# Patient Record
Sex: Female | Born: 1960 | Race: Black or African American | Hispanic: No | Marital: Single | Smoking: Never smoker
Health system: Southern US, Community
[De-identification: ages and names within clinical notes are randomized; demographics above are authoritative.]

## PROBLEM LIST (undated history)

## (undated) DIAGNOSIS — M5410 Radiculopathy, site unspecified: Secondary | ICD-10-CM

## (undated) DIAGNOSIS — E039 Hypothyroidism, unspecified: Secondary | ICD-10-CM

## (undated) DIAGNOSIS — M064 Inflammatory polyarthropathy: Secondary | ICD-10-CM

## (undated) DIAGNOSIS — D259 Leiomyoma of uterus, unspecified: Secondary | ICD-10-CM

## (undated) DIAGNOSIS — R569 Unspecified convulsions: Secondary | ICD-10-CM

## (undated) DIAGNOSIS — IMO0002 Reserved for concepts with insufficient information to code with codable children: Secondary | ICD-10-CM

## (undated) DIAGNOSIS — J302 Other seasonal allergic rhinitis: Secondary | ICD-10-CM

## (undated) DIAGNOSIS — R51 Headache: Secondary | ICD-10-CM

## (undated) DIAGNOSIS — A0472 Enterocolitis due to Clostridium difficile, not specified as recurrent: Secondary | ICD-10-CM

## (undated) DIAGNOSIS — R413 Other amnesia: Secondary | ICD-10-CM

## (undated) DIAGNOSIS — M359 Systemic involvement of connective tissue, unspecified: Secondary | ICD-10-CM

## (undated) DIAGNOSIS — M1990 Unspecified osteoarthritis, unspecified site: Secondary | ICD-10-CM

## (undated) DIAGNOSIS — G5600 Carpal tunnel syndrome, unspecified upper limb: Secondary | ICD-10-CM

## (undated) DIAGNOSIS — Z8719 Personal history of other diseases of the digestive system: Secondary | ICD-10-CM

## (undated) DIAGNOSIS — Q120 Congenital cataract: Secondary | ICD-10-CM

## (undated) DIAGNOSIS — G43709 Chronic migraine without aura, not intractable, without status migrainosus: Secondary | ICD-10-CM

## (undated) DIAGNOSIS — N200 Calculus of kidney: Secondary | ICD-10-CM

## (undated) DIAGNOSIS — J329 Chronic sinusitis, unspecified: Secondary | ICD-10-CM

## (undated) DIAGNOSIS — E785 Hyperlipidemia, unspecified: Secondary | ICD-10-CM

## (undated) DIAGNOSIS — G4730 Sleep apnea, unspecified: Secondary | ICD-10-CM

## (undated) DIAGNOSIS — Z1589 Genetic susceptibility to other disease: Secondary | ICD-10-CM

## (undated) DIAGNOSIS — H35039 Hypertensive retinopathy, unspecified eye: Secondary | ICD-10-CM

## (undated) DIAGNOSIS — R7303 Prediabetes: Secondary | ICD-10-CM

## (undated) DIAGNOSIS — G932 Benign intracranial hypertension: Secondary | ICD-10-CM

## (undated) DIAGNOSIS — D649 Anemia, unspecified: Secondary | ICD-10-CM

## (undated) DIAGNOSIS — E559 Vitamin D deficiency, unspecified: Secondary | ICD-10-CM

## (undated) DIAGNOSIS — K5792 Diverticulitis of intestine, part unspecified, without perforation or abscess without bleeding: Secondary | ICD-10-CM

## (undated) DIAGNOSIS — R404 Transient alteration of awareness: Secondary | ICD-10-CM

## (undated) DIAGNOSIS — M47819 Spondylosis without myelopathy or radiculopathy, site unspecified: Secondary | ICD-10-CM

## (undated) HISTORY — DX: Genetic susceptibility to other disease: Z15.89

## (undated) HISTORY — DX: Enterocolitis due to Clostridium difficile, not specified as recurrent: A04.72

## (undated) HISTORY — DX: Vitamin D deficiency, unspecified: E55.9

## (undated) HISTORY — PX: TONSILLECTOMY: SUR1361

## (undated) HISTORY — DX: Inflammatory polyarthropathy: M06.4

## (undated) HISTORY — DX: Chronic sinusitis, unspecified: J32.9

## (undated) HISTORY — DX: Prediabetes: R73.03

## (undated) HISTORY — DX: Carpal tunnel syndrome, unspecified upper limb: G56.00

## (undated) HISTORY — DX: Reserved for concepts with insufficient information to code with codable children: IMO0002

## (undated) HISTORY — DX: Other amnesia: R41.3

## (undated) HISTORY — DX: Hypertensive retinopathy, unspecified eye: H35.039

## (undated) HISTORY — DX: Diverticulitis of intestine, part unspecified, without perforation or abscess without bleeding: K57.92

## (undated) HISTORY — DX: Transient alteration of awareness: R40.4

## (undated) HISTORY — DX: Radiculopathy, site unspecified: M54.10

## (undated) HISTORY — DX: Morbid (severe) obesity due to excess calories: E66.01

## (undated) HISTORY — DX: Unspecified osteoarthritis, unspecified site: M19.90

## (undated) HISTORY — DX: Leiomyoma of uterus, unspecified: D25.9

## (undated) HISTORY — DX: Chronic migraine without aura, not intractable, without status migrainosus: G43.709

## (undated) HISTORY — DX: Calculus of kidney: N20.0

## (undated) HISTORY — DX: Systemic involvement of connective tissue, unspecified: M35.9

## (undated) HISTORY — DX: Congenital cataract: Q12.0

## (undated) HISTORY — DX: Spondylosis without myelopathy or radiculopathy, site unspecified: M47.819

## (undated) HISTORY — PX: VESICOVAGINAL FISTULA CLOSURE W/ TAH: SUR271

---
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
## 1998-08-18 ENCOUNTER — Emergency Department (HOSPITAL_COMMUNITY): Admission: EM | Admit: 1998-08-18 | Discharge: 1998-08-18 | Payer: Self-pay | Admitting: Emergency Medicine

## 2000-04-15 ENCOUNTER — Encounter: Payer: Self-pay | Admitting: Internal Medicine

## 2000-04-15 ENCOUNTER — Emergency Department (HOSPITAL_COMMUNITY): Admission: EM | Admit: 2000-04-15 | Discharge: 2000-04-15 | Payer: Self-pay | Admitting: Internal Medicine

## 2000-09-06 ENCOUNTER — Emergency Department (HOSPITAL_COMMUNITY): Admission: EM | Admit: 2000-09-06 | Discharge: 2000-09-06 | Payer: Self-pay

## 2001-07-07 ENCOUNTER — Emergency Department (HOSPITAL_COMMUNITY): Admission: EM | Admit: 2001-07-07 | Discharge: 2001-07-08 | Payer: Self-pay | Admitting: Emergency Medicine

## 2002-03-24 ENCOUNTER — Encounter: Payer: Self-pay | Admitting: Emergency Medicine

## 2002-03-24 ENCOUNTER — Emergency Department (HOSPITAL_COMMUNITY): Admission: EM | Admit: 2002-03-24 | Discharge: 2002-03-24 | Payer: Self-pay | Admitting: Emergency Medicine

## 2004-12-01 ENCOUNTER — Emergency Department (HOSPITAL_COMMUNITY): Admission: EM | Admit: 2004-12-01 | Discharge: 2004-12-01 | Payer: Self-pay | Admitting: Family Medicine

## 2006-03-21 ENCOUNTER — Encounter: Admission: RE | Admit: 2006-03-21 | Discharge: 2006-03-21 | Payer: Self-pay | Admitting: Internal Medicine

## 2006-09-24 ENCOUNTER — Ambulatory Visit (HOSPITAL_COMMUNITY): Admission: RE | Admit: 2006-09-24 | Discharge: 2006-09-24 | Payer: Self-pay | Admitting: Orthopedic Surgery

## 2006-09-24 HISTORY — PX: OTHER SURGICAL HISTORY: SHX169

## 2006-11-08 ENCOUNTER — Emergency Department (HOSPITAL_COMMUNITY): Admission: EM | Admit: 2006-11-08 | Discharge: 2006-11-09 | Payer: Self-pay | Admitting: Emergency Medicine

## 2006-11-28 ENCOUNTER — Ambulatory Visit (HOSPITAL_COMMUNITY): Admission: RE | Admit: 2006-11-28 | Discharge: 2006-11-28 | Payer: Self-pay | Admitting: Internal Medicine

## 2007-04-08 ENCOUNTER — Encounter: Admission: RE | Admit: 2007-04-08 | Discharge: 2007-04-08 | Payer: Self-pay

## 2007-04-24 ENCOUNTER — Encounter: Admission: RE | Admit: 2007-04-24 | Discharge: 2007-04-24 | Payer: Self-pay

## 2007-05-20 ENCOUNTER — Encounter: Admission: RE | Admit: 2007-05-20 | Discharge: 2007-05-20 | Payer: Self-pay

## 2007-05-27 ENCOUNTER — Encounter: Admission: RE | Admit: 2007-05-27 | Discharge: 2007-05-27 | Payer: Self-pay | Admitting: Internal Medicine

## 2007-06-09 ENCOUNTER — Encounter: Admission: RE | Admit: 2007-06-09 | Discharge: 2007-06-09 | Payer: Self-pay | Admitting: Internal Medicine

## 2007-07-29 ENCOUNTER — Ambulatory Visit (HOSPITAL_COMMUNITY): Admission: RE | Admit: 2007-07-29 | Discharge: 2007-07-29 | Payer: Self-pay | Admitting: Neurosurgery

## 2007-09-28 ENCOUNTER — Inpatient Hospital Stay (HOSPITAL_COMMUNITY): Admission: RE | Admit: 2007-09-28 | Discharge: 2007-10-02 | Payer: Self-pay | Admitting: Neurosurgery

## 2007-09-28 HISTORY — PX: BACK SURGERY: SHX140

## 2008-12-06 ENCOUNTER — Ambulatory Visit (HOSPITAL_COMMUNITY): Admission: RE | Admit: 2008-12-06 | Discharge: 2008-12-06 | Payer: Self-pay | Admitting: Neurosurgery

## 2009-06-27 ENCOUNTER — Encounter: Admission: RE | Admit: 2009-06-27 | Discharge: 2009-06-27 | Payer: Self-pay | Admitting: Family Medicine

## 2010-07-14 ENCOUNTER — Encounter: Admission: RE | Admit: 2010-07-14 | Discharge: 2010-07-14 | Payer: Self-pay | Admitting: Obstetrics and Gynecology

## 2010-12-09 ENCOUNTER — Encounter: Payer: Self-pay | Admitting: Obstetrics and Gynecology

## 2010-12-09 ENCOUNTER — Encounter: Payer: Self-pay | Admitting: Neurosurgery

## 2011-03-04 LAB — CSF CELL COUNT WITH DIFFERENTIAL
RBC Count, CSF: 33 /mm3 — ABNORMAL HIGH
Tube #: 3
WBC, CSF: 1 /mm3 (ref 0–5)

## 2011-03-04 LAB — CSF CULTURE: Culture: NO GROWTH

## 2011-03-04 LAB — CSF CULTURE W GRAM STAIN

## 2011-03-04 LAB — PROTEIN AND GLUCOSE, CSF
Glucose, CSF: 52 mg/dL (ref 43–76)
Total  Protein, CSF: 29 mg/dL (ref 15–45)

## 2011-04-02 NOTE — Op Note (Signed)
NAME:  Tammy Avery, Tammy Avery NO.:  [id number]   MEDICAL RECORD NO.:  [id number]          PATIENT TYPE:  INP   LOCATION:  3020                         FACILITY:  [facility]   PHYSICIAN:  Cristi Loron, [name].DATE OF BIRTH:  Mar 22, 1961   DATE OF PROCEDURE:  09/28/2007  DATE OF DISCHARGE:                               OPERATIVE REPORT   BRIEF HISTORY:  The patient is a 50 year old black female who has  suffered from back and bilateral leg pain consistent with neurogenic  claudication.  She failed medical management, was worked up with a  lumbar MRI which demonstrated the patient had an L4-L5 spondylolisthesis  as well as severe stenosis at L4-L5 and to a lesser extent L3-L4.  I  discussed the various treatment options with the patient including  surgery.  The patient has weighed the risks, benefits, and alternatives  of surgery and decided to proceed with an L3-L4 and L4-L5 decompression  instrumentation and fusion.   PREOPERATIVE DIAGNOSES:  L3-L4 and L4-L5 spinal stenosis, degenerative  disc disease, scoliosis, lumbar radiculopathy, and lumbago.   POSTOPERATIVE DIAGNOSES:  L3-L4 and L4-L5 spinal stenosis, degenerative  disc disease, scoliosis, lumbar radiculopathy, and lumbago.   PROCEDURE:  Bilateral decompressive laminotomies and foraminotomies at  L3 and L4 to decompress the bilateral L3, L4, and L5 nerve roots; L3-L4  and L4-L5 transforaminal lumbar interbody fusion with local morselized  autograft bone and VITOSS bone-graft extender; insertion of L3-L4 and L4-  L5 interbody prosthesis (Capstone PEEK Cage); L3-L4 and L4-L5  posterolateral arthrodesis with local morselized autograft bone and  VITOSS bone-graft extender; posterior segmental instrumentation L3-L5  with Legacy titanium pedicle screws and rods.   SURGEON:  Cristi Loron, [name].   ASSISTANT:  Coletta Memos, [name].   ANESTHESIA:  General endotracheal.   ESTIMATED BLOOD LOSS:  250 mL.   SPECIMENS:   None.   DRAINS:  None.   COMPLICATIONS:  None.   DESCRIPTION OF PROCEDURE:  The patient was brought to the operating room  by the anesthesia team.  General endotracheal anesthesia was induced.  The patient was then turned to the prone position on the Wilson frame.  Her lumbosacral region was then prepared with Betadine scrub and  Betadine solution.  Sterile drapes were applied.  I then injected the  area to be incised with Marcaine with epinephrine solution.  I used a  scalpel to make a linear midline incision over the L3-L4 and L4-L5  interspaces.  I used electrocautery to perform a bilateral subperiosteal  dissection exposing the spinous process lamina of L2, L3, L4, L5, and  the upper sacrum.  We obtained intraoperative radiographs to confirm our  location and then inserted a Versa-Trac retractor for exposure.  We then  began the decompression by using a high-speed drill to perform bilateral  L3 and L4 laminotomy.  We widened these laminotomies with Kerrison punch  removing the L3-L4 and L4-L5 ligament flavum.  We then performed  bilateral laminotomies about the L3, L4, and L5 nerve roots completing  the decompression.  There was severe spinal stenosis at both levels.  The  extent of decompression was greater than required for the interbody  fusion.   Having completing the decompression, we now turned attention to the  arthrodesis.  I used the Leksell rongeur and drill to remove the  inferior facet of L3 and L4 on the left and then performed a wide  foraminotomy.  This gave a wide lateral exposure to the left L3-L4 and  L4-L5 intervertebral disc.  We incised the disc with a #15 blade scalpel  and performed a partial intervertebral discectomy using the pituitary  forceps.  We then prepared the vertebral endplates for fusion by using  the curettes; the Epstein and Scoville curettes to clear the soft tissue  from the vertebral endplates at L3-L4 and L4-L5.  We used trial spacers  and  determined to use a 10 x 26 mm Capstone Peak interbody prosthesis at  both levels.  We filled anterior disc space with VITOSS and local bone.  We then prefilled the prosthesis with VITOSS and autograft bone and then  inserted the prosthesis into L3-L4 and L4-L5, of course, after  retracting the neural structures out of harm's way.  We then turned the  prosthesis laterally using the bone tamps, and we filled the posterior  part of the prosthesis with VITOSS and autograft bone completing the  transforaminal lumbar interbody fusion.   We now turned attention to the instrumentation.  Under fluoroscopic  guidance, we cannulated the bilateral L3, L4, and L5 pedicles with the  bone probe.  The patient's pedicles were quite sclerotic and small.  We  tapped the pedicles with a 4.5 mm tap and inserted a 5.5 x 50 mm pedicle  screws bilaterally at L3, L4, and L5 under fluoroscopic guidance.  We  then palpated along the medial aspect of the bilateral L3, L4, L5  pedicles and noted that there were no cortical breeches.  (I should also  mention that prior to placing the pedicles, we probed inside the tapped  pedicles with a ball probe and ruled out cortical breeches as well).  We  then connected the unilateral pedicle screw with a lordotic rod which we  fastened in place with the caps and tightened these appropriately  completing the instrumentation.   We now turned attention to posterolateral arthrodesis.  We used the high-  speed drill to decorticate the remainder of the right L3-L4 and L4-L5  facets, as well as the pars and transverse processes.  We laid a  combination of local morselized autograft bone and VITOSS bone-graft  extender over these decorticated posterolateral structures completing  the posterolateral arthrodesis.  We then inspected the thecal sac and  then palpated along the exit route of the bilateral L3, L4, and L5 nerve  roots and noted the neural structures were well decompressed.   We  irrigated the wound out with bacitracin solution, removed the retractor  and then reapproximated the patient's thoracolumbar fascia with  interrupted #1 Vicryl suture, subcutaneous tissue with interrupted 2-0  Vicryl suture, and the skin with Steri-Strips and Benzoin.  The wound  was then coated with bacitracin ointment.  A sterile dressing was  applied.  The drapes were removed.  The patient was subsequently  returned to the supine position where she was extubated by the  anesthesia team and transported to the post-anesthesia care unit in a  stable condition.  All sponge, instruments, and needle counts were  correct at the end of this case.      Cristi Loron, [name].  Electronically Signed  JDJ/MEDQ  D:  09/28/2007  T:  09/29/2007  Job:  [id number]

## 2011-04-05 NOTE — Discharge Summary (Signed)
NAME:  Tammy Avery, Tammy Avery                ACCOUNT NO.:  [id number]   MEDICAL RECORD NO.:  [id number]          PATIENT TYPE:  INP   LOCATION:  3020                         FACILITY:  [facility]   PHYSICIAN:  Cristi Loron, [name].DATE OF BIRTH:  10-16-1961   DATE OF ADMISSION:  09/28/2007  DATE OF DISCHARGE:  10/02/2007                               DISCHARGE SUMMARY   BRIEF HISTORY:  The patient is a 50 year old black female who has  suffered from back and bilateral leg pain consistent with neurogenic  claudication.  She failed medical management, and was worked up with a  lumbar MRI, which demonstrated the patient had an L4-L5  spondylolisthesis as well as severe stenosis at L4-L5 at her last exam  and some stenosis at L3-L4.  I discussed the various treatment options  with the patient including surgery.  The patient is aware of the risks,  benefits, and alternatives of the surgery and decided to proceed with an  L3-L4 and L4-L5 decompression, instrumentation, and fusion.   For further details of this admission, please refer to the typed history  and physical.   HOSPITAL COURSE:  I performed an L3-L4 and L4-L5 decompression, fusion,  and instrumentation on the patient on September 28, 2007.  The surgery  went well (for full details of this operation, please refer to the typed  operative note).   POSTOPERATIVE COURSE:  The patient's postoperative course was  unremarkable.  We had the PT/OT see the patient and by postop day #4,  the patient was ready for discharge to home and was discharged to home  on October 02, 2007.   DISCHARGE INSTRUCTIONS:  The patient was given written discharge  instructions and instructed to follow up with me in 4 weeks.   DISCHARGE PRESCRIPTIONS:  1. Percocet 10/325 mg #100 one p.o. q.4 h. p.r.n. for pain.  2. Valium 5 mg #50 one p.o. q.6 h. p.r.n. muscle spasm.   FINAL DIAGNOSES:  L3-L4 and L4-L5 spinal stenosis, degenerative disc  disease, scoliosis, lumbar  radiculopathy, and lumbago.   PROCEDURE PERFORMED:  Bilateral decompressive laminotomies and  foraminotomies at L3 and L4 and decompression of bilateral L3, L4, and  L5 nerve roots; L3-L4 and L4-L5 transforaminal lumbar interbody fusion  with local morselized autograft bone and VITOSS bone-graft extender;  insertion of L3-L4 and L4-L5 interbody  prosthesis (Capstone PEEK interbody prosthesis); L3-L4 and L4-L5  posterolateral arthrodesis with local morselized autograft bone and  VITOSS bone-graft extender; posterior segmental instrumentation at L3-L5  with Legacy titanium pedicle screws and rods.      Cristi Loron, [name].  Electronically Signed     JDJ/MEDQ  D:  10/22/2007  T:  10/22/2007  Job:  [id number]

## 2011-04-05 NOTE — Op Note (Signed)
NAME:  Tammy Avery, Tammy Avery                ACCOUNT NO.:  [id number]   MEDICAL RECORD NO.:  [id number]          PATIENT TYPE:  AMB   LOCATION:  SDS                          FACILITY:  [facility]   PHYSICIAN:  Feliberto Gottron. Turner Daniels, [name].   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   PROCEDURE:  Right knee arthroscopic removal of chondromalacia of the patella  grade 3, medial femoral condyle grade 3 with flap tears, and loose bodies  x3.   SURGEON:  Feliberto Gottron. Turner Daniels, [name].   ASSISTANT:  None.   ANESTHESIA:  Local with IV sedation.   ESTIMATED BLOOD LOSS:  Minimal.   FLUID REPLACEMENT:  500 mL crystalloid.   DRAINS PLACED:  None.   TOURNIQUET TIME:  None.   INDICATIONS FOR PROCEDURE:  49 year old woman with catching, popping and  pain especially on the medial aspect of her right knee.  She has failed  conservative treatment and desires elective arthroscopic evaluation and  treatment of same.  The risks and benefits of surgery were discussed,  questions answered, the surgery is being done at the main hospital secondary  to a history of sleep apnea.   DESCRIPTION OF PROCEDURE:  The patient was identified by armband and  received a right knee block in the block area and then was taken to the  operative suite at [facility].  Appropriate anesthetic monitors  were attached and IV sedation was administered.  The lateral post was  applied to the table.  The right lower extremity was prepped and draped in  the usual sterile fashion from the ankle to the mid thigh. Using a #11  blade, standard inferomedial, inferolateral, and peripatellar portals were  made allowing introduction of the arthroscope through the inferolateral  portal and the  outflow through the inferomedial portal.  We immediately  encountered loose bodies in the joint fluid and these were taken through the  outflow, at least the small ones were. The patient had grade 3  chondromalacia with some flap tears at the apex of the patella which was  debrided, grade 2 to grade 3 chondromalacia of the trochlea was also  debrided.  Moving into the medial compartment, grade 3 chondromalacia and  flap tears to the distal and posterior aspects of medial femoral condyle was  identified and debrided. The medial meniscus was probed and found be intact  as was the ACL and the PCL. On the lateral side, we encountered a fairly  large loose body requiring removal of the arthroscopic graspers and this was  about 1 cm in size and was pretty much pure cartilage. The articular  cartilage and lateral tibial plateau also had grade 2 to grade 3  chondromalacia and this was lightly debrided.  We found another large loose  body in the anterior aspect of the knee and this was also removed with  the  graspers. The gutters were cleared medially and laterally.  The arthroscopic  instruments were removed after irrigating the wound out with normal saline  solution.  A dressing of Xeroform, 4x4 dressing sponges, Webril and Ace wrap  was applied.  The patient was then undraped and taken to the recovery room  without difficulty.      Feliberto Gottron. Turner Daniels, [name].  Electronically Signed     FJR/MEDQ  D:  09/24/2006  T:  09/24/2006  Job:  [id number]

## 2011-08-27 LAB — BASIC METABOLIC PANEL
BUN: 6
BUN: 8
CO2: 28
CO2: 30
Calcium: 8.6
Calcium: 9.5
Chloride: 104
Chloride: 104
Creatinine, Ser: 0.67
Creatinine, Ser: 0.76
GFR calc Af Amer: >60
GFR calc Af Amer: >60
GFR calc non Af Amer: >60
GFR calc non Af Amer: >60
Glucose, Bld: 109 — ABNORMAL HIGH
Glucose, Bld: 121 — ABNORMAL HIGH
Potassium: 4.1
Potassium: 4.3
Sodium: 138
Sodium: 140

## 2011-08-27 LAB — CBC
HCT: 32.1 — ABNORMAL LOW
HCT: 39.3
Hemoglobin: 10.6 — ABNORMAL LOW
Hemoglobin: 12.8
MCHC: 32.6
MCHC: 33.1
MCV: 82.2
MCV: 82.6
Platelets: 306
Platelets: 418 — ABNORMAL HIGH
RBC: 3.91
RBC: 4.75
RDW: 15.3 — ABNORMAL HIGH
RDW: 15.4
WBC: 11.5 — ABNORMAL HIGH
WBC: 6.4

## 2011-08-27 LAB — TYPE AND SCREEN
ABO/RH(D): A POS
Antibody Screen: NEGATIVE

## 2011-08-27 LAB — ABO/RH: ABO/RH(D): A POS

## 2011-12-03 ENCOUNTER — Other Ambulatory Visit: Payer: Self-pay | Admitting: Family Medicine

## 2011-12-03 DIAGNOSIS — Z1231 Encounter for screening mammogram for malignant neoplasm of breast: Secondary | ICD-10-CM

## 2011-12-04 ENCOUNTER — Ambulatory Visit
Admission: RE | Admit: 2011-12-04 | Discharge: 2011-12-04 | Disposition: A | Discharge status: Home / Self Care | Payer: Self-pay | Source: Ambulatory Visit | Attending: Family Medicine | Admitting: Family Medicine

## 2011-12-04 DIAGNOSIS — Z1231 Encounter for screening mammogram for malignant neoplasm of breast: Secondary | ICD-10-CM

## 2011-12-07 ENCOUNTER — Encounter (HOSPITAL_COMMUNITY): Payer: Self-pay | Admitting: Pharmacist

## 2011-12-19 ENCOUNTER — Encounter (HOSPITAL_COMMUNITY): Admission: RE | Payer: Self-pay | Source: Ambulatory Visit

## 2011-12-19 ENCOUNTER — Ambulatory Visit (HOSPITAL_COMMUNITY): Admission: RE | Admit: 2011-12-19 | Payer: 59 | Source: Ambulatory Visit | Admitting: Obstetrics and Gynecology

## 2011-12-19 SURGERY — ROBOTIC ASSISTED TOTAL HYSTERECTOMY WITH BILATERAL SALPINGO OOPHORECTOMY
Anesthesia: General

## 2011-12-27 ENCOUNTER — Other Ambulatory Visit: Payer: Self-pay | Admitting: Obstetrics and Gynecology

## 2012-01-02 ENCOUNTER — Encounter (HOSPITAL_COMMUNITY)
Admission: RE | Admit: 2012-01-02 | Discharge: 2012-01-02 | Disposition: A | Discharge status: Home / Self Care | Payer: 59 | Source: Ambulatory Visit | Attending: Obstetrics and Gynecology | Admitting: Obstetrics and Gynecology

## 2012-01-02 ENCOUNTER — Encounter (HOSPITAL_COMMUNITY): Payer: Self-pay

## 2012-01-02 HISTORY — DX: Anemia, unspecified: D64.9

## 2012-01-02 HISTORY — DX: Personal history of other diseases of the digestive system: Z87.19

## 2012-01-02 HISTORY — DX: Benign intracranial hypertension: G93.2

## 2012-01-02 HISTORY — DX: Unspecified convulsions: R56.9

## 2012-01-02 HISTORY — DX: Hyperlipidemia, unspecified: E78.5

## 2012-01-02 HISTORY — DX: Hypothyroidism, unspecified: E03.9

## 2012-01-02 HISTORY — DX: Other seasonal allergic rhinitis: J30.2

## 2012-01-02 HISTORY — DX: Sleep apnea, unspecified: G47.30

## 2012-01-02 HISTORY — DX: Headache: R51

## 2012-01-02 LAB — CBC
HCT: 39.9 % (ref 36.0–46.0)
Hemoglobin: 13 g/dL (ref 12.0–15.0)
MCH: 29.7 pg (ref 26.0–34.0)
MCHC: 32.6 g/dL (ref 30.0–36.0)
MCV: 91.1 fL (ref 78.0–100.0)
Platelets: 299 10*3/uL (ref 150–400)
RBC: 4.38 MIL/uL (ref 3.87–5.11)
RDW: 13 % (ref 11.5–15.5)
WBC: 7.4 10*3/uL (ref 4.0–10.5)

## 2012-01-02 LAB — BASIC METABOLIC PANEL
BUN: 14 mg/dL (ref 6–23)
CO2: 27 mEq/L (ref 19–32)
Calcium: 9.2 mg/dL (ref 8.4–10.5)
Chloride: 101 mEq/L (ref 96–112)
Creatinine, Ser: 0.82 mg/dL (ref 0.50–1.10)
GFR calc Af Amer: >90 mL/min (ref >90)
GFR calc non Af Amer: 83 mL/min — ABNORMAL LOW (ref >90)
Glucose, Bld: 83 mg/dL (ref 70–99)
Potassium: 3.8 mEq/L (ref 3.5–5.1)
Sodium: 136 mEq/L (ref 135–145)

## 2012-01-02 LAB — SURGICAL PCR SCREEN
MRSA, PCR: NEGATIVE
Staphylococcus aureus: NEGATIVE

## 2012-01-02 NOTE — Pre-Procedure Instructions (Signed)
Spoke with Dr Malen Gauze concerning patient's history and meds.  Patient instructed as to which meds to take/withhold per Dr Malen Gauze.  Patient instructed to bring CPAP with her on DOS.  Patient verbalized understanding.

## 2012-01-02 NOTE — Patient Instructions (Addendum)
   Your procedure is scheduled on: Thursday, Feb 21st  Enter through the [employer] [facility] at: [employer] up the phone at the desk and dial [phone] and inform us of your arrival.  Please call this number if you have any problems the morning of surgery: [phone]  Remember: Do not eat food after midnight: Wednesday Do not drink clear liquids after: Wednesday Take these medicines the morning of surgery with a SIP OF WATER:  Per anesthesia instructions  Do not wear jewelry, make-up, or FINGER nail polish Do not wear lotions, powders, perfumes or deodorant. Do not shave 48 hours prior to surgery. Do not bring valuables to the hospital.  Leave suitcase in the car. After Surgery it may be brought to your room. For patients being admitted to the hospital, checkout time is 11:00am the day of discharge.  Home with Sister Angell Pincock or Brother Jacqui Headen  Patients discharged on the day of surgery will not be allowed to drive home.     Remember to use your hibiclens as instructed.Please shower with 1/2 bottle the evening before your surgery and the other 1/2 bottle the morning of surgery.

## 2012-01-08 MED ORDER — CIPROFLOXACIN IN D5W 400 MG/200ML IV SOLN
400.0000 mg | INTRAVENOUS | Status: DC
  Filled 2012-01-08: qty 200

## 2012-01-08 MED ORDER — CLINDAMYCIN PHOSPHATE 900 MG/50ML IV SOLN
900.0000 mg | INTRAVENOUS | Status: DC
  Filled 2012-01-08: qty 50

## 2012-01-09 ENCOUNTER — Encounter (HOSPITAL_COMMUNITY): Payer: Self-pay | Admitting: Anesthesiology

## 2012-01-09 ENCOUNTER — Encounter (HOSPITAL_COMMUNITY)
Admission: RE | Disposition: A | Discharge status: Home / Self Care | Payer: Self-pay | Source: Ambulatory Visit | Attending: Obstetrics and Gynecology

## 2012-01-09 ENCOUNTER — Other Ambulatory Visit: Payer: Self-pay | Admitting: Obstetrics and Gynecology

## 2012-01-09 ENCOUNTER — Ambulatory Visit (HOSPITAL_COMMUNITY)
Admission: RE | Admit: 2012-01-09 | Discharge: 2012-01-09 | Disposition: A | Discharge status: Home / Self Care | Payer: 59 | Source: Ambulatory Visit | Attending: Obstetrics and Gynecology | Admitting: Obstetrics and Gynecology

## 2012-01-09 ENCOUNTER — Ambulatory Visit (HOSPITAL_COMMUNITY): Payer: 59 | Admitting: Anesthesiology

## 2012-01-09 DIAGNOSIS — N920 Excessive and frequent menstruation with regular cycle: Secondary | ICD-10-CM | POA: Insufficient documentation

## 2012-01-09 DIAGNOSIS — Z01812 Encounter for preprocedural laboratory examination: Secondary | ICD-10-CM | POA: Insufficient documentation

## 2012-01-09 DIAGNOSIS — N83209 Unspecified ovarian cyst, unspecified side: Secondary | ICD-10-CM | POA: Insufficient documentation

## 2012-01-09 DIAGNOSIS — Z01818 Encounter for other preprocedural examination: Secondary | ICD-10-CM | POA: Insufficient documentation

## 2012-01-09 DIAGNOSIS — Z30431 Encounter for routine checking of intrauterine contraceptive device: Secondary | ICD-10-CM | POA: Insufficient documentation

## 2012-01-09 DIAGNOSIS — D259 Leiomyoma of uterus, unspecified: Secondary | ICD-10-CM | POA: Insufficient documentation

## 2012-01-09 DIAGNOSIS — Z90710 Acquired absence of both cervix and uterus: Secondary | ICD-10-CM

## 2012-01-09 HISTORY — PX: IUD REMOVAL: SHX5392

## 2012-01-09 HISTORY — PX: OVARIAN CYST REMOVAL: SHX89

## 2012-01-09 LAB — BASIC METABOLIC PANEL
BUN: 9 mg/dL (ref 6–23)
CO2: 26 mEq/L (ref 19–32)
Calcium: 8.6 mg/dL (ref 8.4–10.5)
Chloride: 102 mEq/L (ref 96–112)
Creatinine, Ser: 0.73 mg/dL (ref 0.50–1.10)
GFR calc Af Amer: >90 mL/min (ref >90)
GFR calc non Af Amer: >90 mL/min (ref >90)
Glucose, Bld: 168 mg/dL — ABNORMAL HIGH (ref 70–99)
Potassium: 3.9 mEq/L (ref 3.5–5.1)
Sodium: 135 mEq/L (ref 135–145)

## 2012-01-09 LAB — CBC
HCT: 39.1 % (ref 36.0–46.0)
Hemoglobin: 12.9 g/dL (ref 12.0–15.0)
MCH: 30 pg (ref 26.0–34.0)
MCHC: 33 g/dL (ref 30.0–36.0)
MCV: 90.9 fL (ref 78.0–100.0)
Platelets: 262 10*3/uL (ref 150–400)
RBC: 4.3 MIL/uL (ref 3.87–5.11)
RDW: 13.1 % (ref 11.5–15.5)
WBC: 14.2 10*3/uL — ABNORMAL HIGH (ref 4.0–10.5)

## 2012-01-09 SURGERY — ROBOTIC ASSISTED TOTAL HYSTERECTOMY
Anesthesia: General | Site: Abdomen | Laterality: Right | Wound class: Clean Contaminated

## 2012-01-09 MED ORDER — PROMETHAZINE HCL 25 MG/ML IJ SOLN: 6.2500 mg | INTRAMUSCULAR | Status: DC | PRN

## 2012-01-09 MED ORDER — CARVEDILOL 25 MG PO TABS
25.0000 mg | ORAL_TABLET | Freq: Two times a day (BID) | ORAL | Status: DC
Start: 2012-01-09 — End: 2012-01-10
  Administered 2012-01-09: 25 mg via ORAL
  Filled 2012-01-09 (×2): qty 1

## 2012-01-09 MED ORDER — PANTOPRAZOLE SODIUM 40 MG PO TBEC
40.0000 mg | DELAYED_RELEASE_TABLET | Freq: Every day | ORAL | Status: DC
  Administered 2012-01-09: 40 mg via ORAL
  Filled 2012-01-09 (×2): qty 1

## 2012-01-09 MED ORDER — LIDOCAINE HCL (CARDIAC) 20 MG/ML IV SOLN
INTRAVENOUS | Status: DC | PRN
  Administered 2012-01-09: 100 mg via INTRAVENOUS

## 2012-01-09 MED ORDER — BIOTIN 2.5 MG PO CAPS: 1.0000 | ORAL_CAPSULE | Freq: Every day | ORAL | Status: DC

## 2012-01-09 MED ORDER — PHENYLEPHRINE 40 MCG/ML (10ML) SYRINGE FOR IV PUSH (FOR BLOOD PRESSURE SUPPORT)
PREFILLED_SYRINGE | INTRAVENOUS | Status: AC
  Filled 2012-01-09: qty 10

## 2012-01-09 MED ORDER — ROCURONIUM BROMIDE 50 MG/5ML IV SOLN
INTRAVENOUS | Status: AC
  Filled 2012-01-09: qty 1

## 2012-01-09 MED ORDER — FENTANYL CITRATE 0.05 MG/ML IJ SOLN
INTRAMUSCULAR | Status: AC
  Filled 2012-01-09: qty 10

## 2012-01-09 MED ORDER — OXYCODONE-ACETAMINOPHEN 5-325 MG PO TABS: 1.0000 | ORAL_TABLET | ORAL | Status: DC | PRN

## 2012-01-09 MED ORDER — VITAMIN B-6 100 MG PO TABS
100.0000 mg | ORAL_TABLET | Freq: Every day | ORAL | Status: DC
  Filled 2012-01-09: qty 1

## 2012-01-09 MED ORDER — FENTANYL CITRATE 0.05 MG/ML IJ SOLN: 25.0000 ug | INTRAMUSCULAR | Status: DC | PRN

## 2012-01-09 MED ORDER — GLYCOPYRROLATE 0.2 MG/ML IJ SOLN
INTRAMUSCULAR | Status: AC
  Filled 2012-01-09: qty 2

## 2012-01-09 MED ORDER — IBUPROFEN 800 MG PO TABS: 800.0000 mg | ORAL_TABLET | Freq: Three times a day (TID) | ORAL | Status: DC | PRN

## 2012-01-09 MED ORDER — ZOLPIDEM TARTRATE 5 MG PO TABS: 5.0000 mg | ORAL_TABLET | Freq: Every evening | ORAL | Status: DC | PRN

## 2012-01-09 MED ORDER — PROPOFOL 10 MG/ML IV EMUL
INTRAVENOUS | Status: AC
  Filled 2012-01-09: qty 20

## 2012-01-09 MED ORDER — LACTATED RINGERS IR SOLN
Status: DC | PRN
  Administered 2012-01-09: 3000 mL

## 2012-01-09 MED ORDER — ONDANSETRON HCL 4 MG/2ML IJ SOLN
INTRAMUSCULAR | Status: DC | PRN
  Administered 2012-01-09: 4 mg via INTRAVENOUS

## 2012-01-09 MED ORDER — PROPOFOL 10 MG/ML IV EMUL
INTRAVENOUS | Status: DC | PRN
  Administered 2012-01-09: 180 mg via INTRAVENOUS

## 2012-01-09 MED ORDER — PHENYLEPHRINE HCL 10 MG/ML IJ SOLN
INTRAMUSCULAR | Status: DC | PRN
  Administered 2012-01-09: .04 mg via INTRAVENOUS
  Administered 2012-01-09 (×2): .08 mg via INTRAVENOUS
  Administered 2012-01-09: .04 mg via INTRAVENOUS
  Administered 2012-01-09: .08 mg via INTRAVENOUS
  Administered 2012-01-09 (×3): .04 mg via INTRAVENOUS
  Administered 2012-01-09: .08 mg via INTRAVENOUS
  Administered 2012-01-09: .04 mg via INTRAVENOUS
  Administered 2012-01-09: .08 mg via INTRAVENOUS
  Administered 2012-01-09 (×2): .04 mg via INTRAVENOUS
  Administered 2012-01-09: .08 mg via INTRAVENOUS

## 2012-01-09 MED ORDER — PHENYLEPHRINE 40 MCG/ML (10ML) SYRINGE FOR IV PUSH (FOR BLOOD PRESSURE SUPPORT)
PREFILLED_SYRINGE | INTRAVENOUS | Status: AC
  Filled 2012-01-09: qty 5

## 2012-01-09 MED ORDER — ZONISAMIDE 100 MG PO CAPS
300.0000 mg | ORAL_CAPSULE | Freq: Every day | ORAL | Status: DC
  Filled 2012-01-09: qty 3

## 2012-01-09 MED ORDER — CLINDAMYCIN PHOSPHATE 600 MG/50ML IV SOLN
INTRAVENOUS | Status: DC | PRN
  Administered 2012-01-09: 900 mg via INTRAVENOUS

## 2012-01-09 MED ORDER — LIDOCAINE HCL (CARDIAC) 20 MG/ML IV SOLN
INTRAVENOUS | Status: AC
  Filled 2012-01-09: qty 5

## 2012-01-09 MED ORDER — NEOSTIGMINE METHYLSULFATE 1 MG/ML IJ SOLN
INTRAMUSCULAR | Status: AC
  Filled 2012-01-09: qty 10

## 2012-01-09 MED ORDER — GLUCOSAMINE SULFATE 1000 MG PO CAPS: 1.0000 | ORAL_CAPSULE | Freq: Two times a day (BID) | ORAL | Status: DC

## 2012-01-09 MED ORDER — FENTANYL CITRATE 0.05 MG/ML IJ SOLN
INTRAMUSCULAR | Status: AC
  Filled 2012-01-09: qty 2

## 2012-01-09 MED ORDER — ONDANSETRON HCL 4 MG/2ML IJ SOLN
INTRAMUSCULAR | Status: AC
  Filled 2012-01-09: qty 2

## 2012-01-09 MED ORDER — POTASSIUM CHLORIDE ER 10 MEQ PO TBCR
10.0000 meq | EXTENDED_RELEASE_TABLET | Freq: Two times a day (BID) | ORAL | Status: DC
  Filled 2012-01-09 (×2): qty 1

## 2012-01-09 MED ORDER — KETOROLAC TROMETHAMINE 30 MG/ML IJ SOLN: 15.0000 mg | Freq: Once | INTRAMUSCULAR | Status: DC | PRN

## 2012-01-09 MED ORDER — CLINDAMYCIN PHOSPHATE 900 MG/50ML IV SOLN
900.0000 mg | Freq: Three times a day (TID) | INTRAVENOUS | Status: DC
  Administered 2012-01-09: 900 mg via INTRAVENOUS
  Filled 2012-01-09 (×2): qty 50

## 2012-01-09 MED ORDER — ONDANSETRON HCL 4 MG/2ML IJ SOLN: 4.0000 mg | Freq: Four times a day (QID) | INTRAMUSCULAR | Status: DC | PRN

## 2012-01-09 MED ORDER — NALOXONE HCL 0.4 MG/ML IJ SOLN: 0.4000 mg | INTRAMUSCULAR | Status: DC | PRN

## 2012-01-09 MED ORDER — DEXAMETHASONE SODIUM PHOSPHATE 10 MG/ML IJ SOLN
INTRAMUSCULAR | Status: AC
  Filled 2012-01-09: qty 1

## 2012-01-09 MED ORDER — KETOROLAC TROMETHAMINE 30 MG/ML IJ SOLN
30.0000 mg | Freq: Four times a day (QID) | INTRAMUSCULAR | Status: DC
  Administered 2012-01-09: 30 mg via INTRAVENOUS
  Filled 2012-01-09: qty 1

## 2012-01-09 MED ORDER — VITAMIN B-12 1000 MCG PO TABS
1000.0000 ug | ORAL_TABLET | Freq: Every day | ORAL | Status: DC
  Administered 2012-01-09: 1000 ug via ORAL
  Filled 2012-01-09 (×2): qty 1

## 2012-01-09 MED ORDER — DIPHENHYDRAMINE HCL 12.5 MG/5ML PO ELIX: 12.5000 mg | ORAL_SOLUTION | Freq: Four times a day (QID) | ORAL | Status: DC | PRN

## 2012-01-09 MED ORDER — FLUTICASONE PROPIONATE 50 MCG/ACT NA SUSP
2.0000 | Freq: Every day | NASAL | Status: DC
  Filled 2012-01-09: qty 16

## 2012-01-09 MED ORDER — NEOSTIGMINE METHYLSULFATE 1 MG/ML IJ SOLN
INTRAMUSCULAR | Status: DC | PRN
  Administered 2012-01-09: 3 mg via INTRAVENOUS

## 2012-01-09 MED ORDER — PHENYLEPHRINE HCL 10 MG/ML IJ SOLN
INTRAMUSCULAR | Status: AC
  Filled 2012-01-09: qty 1

## 2012-01-09 MED ORDER — GLYCOPYRROLATE 0.2 MG/ML IJ SOLN
INTRAMUSCULAR | Status: DC | PRN
  Administered 2012-01-09: .6 mg via INTRAVENOUS

## 2012-01-09 MED ORDER — DEXAMETHASONE SODIUM PHOSPHATE 4 MG/ML IJ SOLN
INTRAMUSCULAR | Status: DC | PRN
  Administered 2012-01-09: 10 mg via INTRAVENOUS

## 2012-01-09 MED ORDER — VITAMIN C 500 MG PO TABS
500.0000 mg | ORAL_TABLET | Freq: Every day | ORAL | Status: DC
  Administered 2012-01-09: 500 mg via ORAL
  Filled 2012-01-09 (×2): qty 1

## 2012-01-09 MED ORDER — VASOPRESSIN 20 UNIT/ML IJ SOLN
INTRAMUSCULAR | Status: AC
  Filled 2012-01-09: qty 1

## 2012-01-09 MED ORDER — LEVOTHYROXINE SODIUM 75 MCG PO TABS
75.0000 ug | ORAL_TABLET | Freq: Every day | ORAL | Status: DC
  Filled 2012-01-09: qty 1

## 2012-01-09 MED ORDER — CLONIDINE HCL 0.1 MG PO TABS
0.1000 mg | ORAL_TABLET | Freq: Two times a day (BID) | ORAL | Status: DC
  Filled 2012-01-09 (×2): qty 1

## 2012-01-09 MED ORDER — DICLOFENAC SODIUM 1 % TD GEL
1.0000 "application " | Freq: Four times a day (QID) | TRANSDERMAL | Status: DC
  Filled 2012-01-09: qty 100

## 2012-01-09 MED ORDER — ACETAMINOPHEN 10 MG/ML IV SOLN
1000.0000 mg | Freq: Four times a day (QID) | INTRAVENOUS | Status: AC
  Administered 2012-01-09: 1000 mg via INTRAVENOUS
  Filled 2012-01-09: qty 100

## 2012-01-09 MED ORDER — SODIUM CHLORIDE 0.9 % IJ SOLN: 9.0000 mL | INTRAMUSCULAR | Status: DC | PRN

## 2012-01-09 MED ORDER — CARISOPRODOL 350 MG PO TABS
350.0000 mg | ORAL_TABLET | Freq: Four times a day (QID) | ORAL | Status: DC
  Administered 2012-01-09: 350 mg via ORAL
  Filled 2012-01-09: qty 1

## 2012-01-09 MED ORDER — FENTANYL CITRATE 0.05 MG/ML IJ SOLN
INTRAMUSCULAR | Status: DC | PRN
  Administered 2012-01-09 (×2): 50 ug via INTRAVENOUS
  Administered 2012-01-09: 150 ug via INTRAVENOUS

## 2012-01-09 MED ORDER — BUPIVACAINE HCL (PF) 0.25 % IJ SOLN
INTRAMUSCULAR | Status: DC | PRN
  Administered 2012-01-09: 11 mL

## 2012-01-09 MED ORDER — ACETAMINOPHEN 325 MG PO TABS: 325.0000 mg | ORAL_TABLET | ORAL | Status: DC | PRN

## 2012-01-09 MED ORDER — FUROSEMIDE 40 MG PO TABS
40.0000 mg | ORAL_TABLET | Freq: Every day | ORAL | Status: DC
  Administered 2012-01-09: 40 mg via ORAL
  Filled 2012-01-09 (×2): qty 1

## 2012-01-09 MED ORDER — DIPHENHYDRAMINE HCL 50 MG/ML IJ SOLN: 12.5000 mg | Freq: Four times a day (QID) | INTRAMUSCULAR | Status: DC | PRN

## 2012-01-09 MED ORDER — HYDROMORPHONE HCL PF 1 MG/ML IJ SOLN: 0.2000 mg | INTRAMUSCULAR | Status: DC | PRN

## 2012-01-09 MED ORDER — CIPROFLOXACIN IN D5W 400 MG/200ML IV SOLN
400.0000 mg | Freq: Two times a day (BID) | INTRAVENOUS | Status: DC
  Administered 2012-01-09: 400 mg via INTRAVENOUS
  Filled 2012-01-09 (×2): qty 200

## 2012-01-09 MED ORDER — LOSARTAN POTASSIUM 50 MG PO TABS
100.0000 mg | ORAL_TABLET | Freq: Every day | ORAL | Status: DC
  Filled 2012-01-09: qty 2

## 2012-01-09 MED ORDER — DEXTROSE IN LACTATED RINGERS 5 % IV SOLN: INTRAVENOUS | Status: DC

## 2012-01-09 MED ORDER — ALISKIREN FUMARATE 150 MG PO TABS
300.0000 mg | ORAL_TABLET | Freq: Every day | ORAL | Status: DC
  Filled 2012-01-09: qty 2

## 2012-01-09 MED ORDER — CIPROFLOXACIN IN D5W 400 MG/200ML IV SOLN
INTRAVENOUS | Status: DC | PRN
  Administered 2012-01-09: 400 mg via INTRAVENOUS

## 2012-01-09 MED ORDER — KETOROLAC TROMETHAMINE 30 MG/ML IJ SOLN: 30.0000 mg | Freq: Four times a day (QID) | INTRAMUSCULAR | Status: DC

## 2012-01-09 MED ORDER — LACTATED RINGERS IV SOLN
INTRAVENOUS | Status: DC
  Administered 2012-01-09 (×5): via INTRAVENOUS

## 2012-01-09 MED ORDER — HYDROMORPHONE 0.3 MG/ML IV SOLN: INTRAVENOUS | Status: DC

## 2012-01-09 MED ORDER — ROCURONIUM BROMIDE 100 MG/10ML IV SOLN
INTRAVENOUS | Status: DC | PRN
  Administered 2012-01-09 (×4): 10 mg via INTRAVENOUS
  Administered 2012-01-09: 50 mg via INTRAVENOUS
  Administered 2012-01-09: 10 mg via INTRAVENOUS

## 2012-01-09 MED ORDER — BUPIVACAINE HCL (PF) 0.25 % IJ SOLN
INTRAMUSCULAR | Status: AC
  Filled 2012-01-09: qty 30

## 2012-01-09 MED ORDER — HYDROXYCHLOROQUINE SULFATE 200 MG PO TABS
200.0000 mg | ORAL_TABLET | Freq: Two times a day (BID) | ORAL | Status: DC
  Filled 2012-01-09 (×2): qty 1

## 2012-01-09 MED ORDER — MENTHOL 3 MG MT LOZG: 1.0000 | LOZENGE | OROMUCOSAL | Status: DC | PRN

## 2012-01-09 MED ORDER — PHENYLEPHRINE HCL 10 MG/ML IJ SOLN
10.0000 mg | INTRAVENOUS | Status: DC | PRN
  Administered 2012-01-09: 40 ug/min via INTRAVENOUS

## 2012-01-09 MED ORDER — ONDANSETRON HCL 4 MG PO TABS: 4.0000 mg | ORAL_TABLET | Freq: Four times a day (QID) | ORAL | Status: DC | PRN

## 2012-01-09 MED ORDER — MIDAZOLAM HCL 2 MG/2ML IJ SOLN
INTRAMUSCULAR | Status: AC
  Filled 2012-01-09: qty 2

## 2012-01-09 MED ORDER — SPIRONOLACTONE 25 MG PO TABS
25.0000 mg | ORAL_TABLET | Freq: Every day | ORAL | Status: DC
  Filled 2012-01-09: qty 1

## 2012-01-09 MED ORDER — FUROSEMIDE 20 MG PO TABS
20.0000 mg | ORAL_TABLET | Freq: Two times a day (BID) | ORAL | Status: DC
  Filled 2012-01-09 (×2): qty 1

## 2012-01-09 MED ORDER — MIDAZOLAM HCL 5 MG/5ML IJ SOLN
INTRAMUSCULAR | Status: DC | PRN
  Administered 2012-01-09: 1 mg via INTRAVENOUS

## 2012-01-09 SURGICAL SUPPLY — 83 items
BAG URINE DRAINAGE (UROLOGICAL SUPPLIES) ×6 IMPLANT
BARRIER ADHS 3X4 INTERCEED (GAUZE/BANDAGES/DRESSINGS) IMPLANT
BENZOIN TINCTURE PRP APPL 2/3 (GAUZE/BANDAGES/DRESSINGS) ×6 IMPLANT
BLADE MORCELLATOR EXT  12.5X15 (ELECTROSURGICAL) ×1
BLADE MORCELLATOR EXT 12.5X15 (ELECTROSURGICAL) ×5 IMPLANT
CABLE HIGH FREQUENCY MONO STRZ (ELECTRODE) ×6 IMPLANT
CANISTER SUCTION 2500CC (MISCELLANEOUS) ×6 IMPLANT
CATH FOLEY 3WAY  5CC 16FR (CATHETERS) ×1
CATH FOLEY 3WAY 5CC 16FR (CATHETERS) ×5 IMPLANT
CHLORAPREP W/TINT 26ML (MISCELLANEOUS) ×6 IMPLANT
CLOTH BEACON ORANGE TIMEOUT ST (SAFETY) ×6 IMPLANT
CONT PATH 16OZ SNAP LID 3702 (MISCELLANEOUS) ×6 IMPLANT
COVER MAYO STAND STRL (DRAPES) ×6 IMPLANT
COVER TABLE BACK 60X90 (DRAPES) ×12 IMPLANT
COVER TIP SHEARS 8 DVNC (MISCELLANEOUS) ×5 IMPLANT
COVER TIP SHEARS 8MM DA VINCI (MISCELLANEOUS) ×1
DECANTER SPIKE VIAL GLASS SM (MISCELLANEOUS) ×6 IMPLANT
DERMABOND ADVANCED (GAUZE/BANDAGES/DRESSINGS) ×1
DERMABOND ADVANCED .7 DNX12 (GAUZE/BANDAGES/DRESSINGS) ×5 IMPLANT
DRAPE CESAREAN BIRTH W POUCH (DRAPES) ×6 IMPLANT
DRAPE HUG U DISPOSABLE (DRAPE) ×6 IMPLANT
DRAPE LG THREE QUARTER DISP (DRAPES) ×12 IMPLANT
DRAPE MONITOR DA VINCI (DRAPE) IMPLANT
DRAPE WARM FLUID 44X44 (DRAPE) ×6 IMPLANT
ELECT REM PT RETURN 9FT ADLT (ELECTROSURGICAL) ×6
ELECTRODE REM PT RTRN 9FT ADLT (ELECTROSURGICAL) ×5 IMPLANT
EVACUATOR SMOKE 8.L (FILTER) ×6 IMPLANT
FORCEPS CUTTING 33CM 5MM (CUTTING FORCEPS) ×6 IMPLANT
GAUZE SPONGE 4X4 16PLY XRAY LF (GAUZE/BANDAGES/DRESSINGS) ×6 IMPLANT
GAUZE VASELINE 3X9 (GAUZE/BANDAGES/DRESSINGS) ×6 IMPLANT
GLOVE BIO SURGEON STRL SZ 6.5 (GLOVE) ×18 IMPLANT
GLOVE BIOGEL PI IND STRL 7.0 (GLOVE) ×40 IMPLANT
GLOVE BIOGEL PI INDICATOR 7.0 (GLOVE) ×8
GOWN PREVENTION PLUS LG XLONG (DISPOSABLE) ×30 IMPLANT
GOWN STRL REIN XL XLG (GOWN DISPOSABLE) ×54 IMPLANT
KIT ACCESSORY DA VINCI DISP (KITS) ×1
KIT ACCESSORY DVNC DISP (KITS) ×5 IMPLANT
KIT DISP ACCESSORY 4 ARM (KITS) ×6 IMPLANT
NEEDLE HYPO 25X1 1.5 SAFETY (NEEDLE) ×6 IMPLANT
NEEDLE INSUFFLATION 14GA 120MM (NEEDLE) ×6 IMPLANT
OCCLUDER COLPOPNEUMO (BALLOONS) ×6 IMPLANT
PACK ABDOMINAL GYN (CUSTOM PROCEDURE TRAY) ×6 IMPLANT
PACK LAVH (CUSTOM PROCEDURE TRAY) ×6 IMPLANT
PAD OB MATERNITY 4.3X12.25 (PERSONAL CARE ITEMS) ×6 IMPLANT
PAD PREP 24X48 CUFFED NSTRL (MISCELLANEOUS) ×12 IMPLANT
PLUG CATH AND CAP STER (CATHETERS) ×6 IMPLANT
PROTECTOR NERVE ULNAR (MISCELLANEOUS) ×12 IMPLANT
SCISSORS LAP 5X35 DISP (ENDOMECHANICALS) IMPLANT
SET IRRIG TUBING LAPAROSCOPIC (IRRIGATION / IRRIGATOR) ×6 IMPLANT
SOLUTION ELECTROLUBE (MISCELLANEOUS) ×6 IMPLANT
SPONGE LAP 18X18 X RAY DECT (DISPOSABLE) ×12 IMPLANT
STAPLER VISISTAT 35W (STAPLE) IMPLANT
STRIP CLOSURE SKIN 1/2X4 (GAUZE/BANDAGES/DRESSINGS) ×6 IMPLANT
SUT CHROMIC 3 0 SH 27 (SUTURE) ×12 IMPLANT
SUT PLAIN 2 0 XLH (SUTURE) IMPLANT
SUT PROLENE 0 CT 1 30 (SUTURE) IMPLANT
SUT VIC AB 0 CT1 18XCR BRD8 (SUTURE) ×5 IMPLANT
SUT VIC AB 0 CT1 27 (SUTURE) ×5
SUT VIC AB 0 CT1 27XBRD ANTBC (SUTURE) ×25 IMPLANT
SUT VIC AB 0 CT1 36 (SUTURE) ×12 IMPLANT
SUT VIC AB 0 CT1 8-18 (SUTURE) ×1
SUT VICRYL 0 TIES 12 18 (SUTURE) ×6 IMPLANT
SUT VICRYL 0 UR6 27IN ABS (SUTURE) ×18 IMPLANT
SUT VICRYL 4-0 PS2 18IN ABS (SUTURE) ×18 IMPLANT
SYR 50ML LL SCALE MARK (SYRINGE) ×6 IMPLANT
SYR CONTROL 10ML LL (SYRINGE) ×6 IMPLANT
SYSTEM CONVERTIBLE TROCAR (TROCAR) IMPLANT
TIP RUMI ORANGE 6.7MMX12CM (TIP) ×6 IMPLANT
TIP UTERINE 5.1X6CM LAV DISP (MISCELLANEOUS) IMPLANT
TIP UTERINE 6.7X10CM GRN DISP (MISCELLANEOUS) IMPLANT
TIP UTERINE 6.7X6CM WHT DISP (MISCELLANEOUS) IMPLANT
TIP UTERINE 6.7X8CM BLUE DISP (MISCELLANEOUS) IMPLANT
TOWEL OR 17X24 6PK STRL BLUE (TOWEL DISPOSABLE) ×18 IMPLANT
TRAY FOLEY CATH 14FR (SET/KITS/TRAYS/PACK) ×6 IMPLANT
TROCAR 12M 150ML BLUNT (TROCAR) ×6 IMPLANT
TROCAR DISP BLADELESS 8 DVNC (TROCAR) IMPLANT
TROCAR DISP BLADELESS 8MM (TROCAR)
TROCAR XCEL 12X100 BLDLESS (ENDOMECHANICALS) ×6 IMPLANT
TROCAR Z-THREAD 12X150 (TROCAR) ×6 IMPLANT
TROCAR Z-THREAD BLADED 12X100M (TROCAR) ×6 IMPLANT
TUBING FILTER THERMOFLATOR (ELECTROSURGICAL) ×6 IMPLANT
WARMER LAPAROSCOPE (MISCELLANEOUS) ×6 IMPLANT
WATER STERILE IRR 1000ML POUR (IV SOLUTION) ×18 IMPLANT

## 2012-01-09 NOTE — Transfer of Care (Signed)
Immediate Anesthesia Transfer of Care Note  Patient: Tammy Avery  Procedure(s) Performed: Procedure(s) (LRB): ROBOTIC ASSISTED TOTAL HYSTERECTOMY (N/A) OVARIAN CYSTECTOMY (Right)  Patient Location: PACU  Anesthesia Type: General  Level of Consciousness: awake, alert , oriented and patient cooperative  Airway & Oxygen Therapy: Patient Spontanous Breathing and Patient connected to face mask oxygen  Post-op Assessment: Report given to PACU RN and Post -op Vital signs reviewed and stable  Post vital signs: Reviewed and stable  Complications: No apparent anesthesia complications

## 2012-01-09 NOTE — Anesthesia Preprocedure Evaluation (Addendum)
Anesthesia Evaluation  Patient identified by MRN, date of birth, ID band Patient awake    Reviewed: Allergy & Precautions, H&P , Patient's Chart, lab work & pertinent test results, reviewed documented beta blocker date and time   History of Anesthesia Complications Negative for: history of anesthetic complications  Airway Mallampati: III TM Distance: >3 FB Neck ROM: full    Dental No notable dental hx.    Pulmonary neg pulmonary ROS, sleep apnea ,  clear to auscultation  Pulmonary exam normal       Cardiovascular Exercise Tolerance: Good hypertension, neg cardio ROS regular Normal    Neuro/Psych  Headaches, Seizures -,  Negative Neurological ROS  Negative Psych ROS   GI/Hepatic negative GI ROS, Neg liver ROS, hiatal hernia,   Endo/Other  Negative Endocrine ROSHypothyroidism Morbid obesity  Renal/GU Renal diseasenegative Renal ROS     Musculoskeletal   Abdominal   Peds  Hematology negative hematology ROS (+)   Anesthesia Other Findings History Date Comments History Date Comments    Hypertension     Seasonal allergies        Arthritis   knees, back, hands Hypothyroidism        Hyperlipidemia     Seizures    on meds - last one 10/18/11- meds increased    Sleep apnea   Patient does not use CPAP Anemia        Chronic kidney disease   kidney stones - no surgery required H/O hiatal hernia   no meds - no problems    Pseudotumor cerebri syndrome    Reproductive/Obstetrics negative OB ROS                          Anesthesia Physical Anesthesia Plan  ASA: III  Anesthesia Plan: General ETT   Post-op Pain Management:    Induction:   Airway Management Planned:   Additional Equipment:   Intra-op Plan:   Post-operative Plan:   Informed Consent: I have reviewed the patients History and Physical, chart, labs and discussed the procedure including the risks, benefits and alternatives for the  proposed anesthesia with the patient or authorized representative who has indicated his/her understanding and acceptance.   Dental Advisory Given  Plan Discussed with: CRNA and Surgeon  Anesthesia Plan Comments:        discussed increased risk of vision loss with Pseudotumor Cerebri and steep trendelenburg and case duration Anesthesia Quick Evaluation

## 2012-01-09 NOTE — Preoperative (Signed)
Beta Blockers   Reason not to administer Beta Blockers: medications taken at home this a.m. per patient as scheduled

## 2012-01-09 NOTE — Brief Op Note (Signed)
01/09/2012  12:58 PM  PATIENT:  Tammy Avery  51 y.o. female  PRE-OPERATIVE DIAGNOSIS:  Fibroid Uterus, Lost IUD, menorrhagia  POST-OPERATIVE DIAGNOSIS:  Fibroid Uterus, Lost IUD, menorrhagia, right ovarian cyst  PROCEDURE:  Procedure(s) (LRB):DAVINCI ROBOTIC ASSISTED TOTAL HYSTERECTOMY (N/A)RIGHT OVARIAN CYSTECTOMY (Right) INTRAUTERINE DEVICE (IUD) REMOVAL (N/A) BILATERAL SALPINGECTOMY (Bilateral)  SURGEON:  Surgeon(s) and Role:    * Jeany Seville Cathie Beams, MD - Primary    * Lenoard Aden, MD - Assisting  PHYSICIAN ASSISTANT:   ASSISTANTS: Olivia Mackie, MD   ANESTHESIA:   general  FINDINGS; UTERUS @ 18 WK SIZE( ANT FIBROID UTERUS) NL TUBES BILATERAL, RIGHT OV CYST, NL APPENDIX,  NL LIVER EDGE  EBL:  Total I/O In: 2000 [I.V.:2000] Out: 625 [Urine:325; Blood:300]  BLOOD ADMINISTERED:none  DRAINS: none   LOCAL MEDICATIONS USED:  MARCAINE     SPECIMEN:  Source of Specimen:  UTERUS W/ CERVIX, FALLOPIAN TUBE, RIGHT OVARIAN CYST WALL  DISPOSITION OF SPECIMEN:  PATHOLOGY  COUNTS:  YES  TOURNIQUET:  * No tourniquets in log *  DICTATION: .Other Dictation: Dictation Number   PLAN OF CARE: Admit for overnight observation  PATIENT DISPOSITION:  PACU - hemodynamically stable.   Delay start of Pharmacological VTE agent (>24hrs) due to surgical blood loss or risk of bleeding: no

## 2012-01-09 NOTE — Discharge Instructions (Signed)
Call if temperature greater than equal to 100.4, nothing per vagina for 4-6 weeks or severe nausea vomiting, increased incisional pain , drainage or redness in the incision site, no straining with bowel movements, showers no bath °

## 2012-01-09 NOTE — Progress Notes (Signed)
S: S/p DaVinci robotic total hysterectomy, bilat salpingectomy, right ov cystectomy Pt requesting to go home. Tolerating clear liquid. Ambulated  Intraop findings reviewed.  O: BP 162/94  WDWN BF in NAD  Abd: obese soft, incisions well approx  IMP: stable for D/C  P) complete antibiotics. Void. Eat reg diet. D/c instructions reviewed. Pt has vicodin for back pain already. Declines script. F/u 2 wk

## 2012-01-09 NOTE — Progress Notes (Signed)
Pt ambulating around unit. Pt urinated pt stable and complains of no pain. DC instructions reviewed with pt and family. Pt states complete understanding. With no concerns. Pt ambulated to main entrance.

## 2012-01-09 NOTE — Progress Notes (Signed)
PHARMACIST - PHYSICIAN ORDER COMMUNICATION  CONCERNING: P&T Medication Policy on Herbal Medications  DESCRIPTION:  This patient's order for:  Biotin and glucosamine sulfate  has been noted.  This product(s) is classified as an "herbal" or natural product. Due to a lack of definitive safety studies or FDA approval, nonstandard manufacturing practices, plus the potential risk of unknown drug-drug interactions while on inpatient medications, the Pharmacy and Therapeutics Committee does not permit the use of "herbal" or natural products of this type within [facility].   ACTION TAKEN: The pharmacy department is unable to verify this order at this time and your patient has been informed of this safety policy. Please reevaluate patient's clinical condition at discharge and address if the herbal or natural product(s) should be resumed at that time.

## 2012-01-09 NOTE — Anesthesia Postprocedure Evaluation (Signed)
  Anesthesia Post-op Note  Patient: Tammy Avery  Procedure(s) Performed: Procedure(s) (LRB): ROBOTIC ASSISTED TOTAL HYSTERECTOMY (N/A) OVARIAN CYSTECTOMY (Right) INTRAUTERINE DEVICE (IUD) REMOVAL (N/A) BILATERAL SALPINGECTOMY (Bilateral)  Patient Location: PACU  Anesthesia Type: General  Level of Consciousness: awake, alert  and oriented  Airway and Oxygen Therapy: Patient Spontanous Breathing  Post-op Pain: none  Post-op Assessment: Post-op Vital signs reviewed, Patient's Cardiovascular Status Stable, Respiratory Function Stable, Patent Airway, No signs of Nausea or vomiting and Pain level controlled  Post-op Vital Signs: Reviewed and stable  Complications: No apparent anesthesia complications

## 2012-01-10 ENCOUNTER — Encounter (HOSPITAL_COMMUNITY): Payer: Self-pay | Admitting: Obstetrics and Gynecology

## 2012-01-10 NOTE — Op Note (Signed)
NAME:  Tammy Avery, DROEGE                ACCOUNT NO.:  [id number]  MEDICAL RECORD NO.:  [id number]  LOCATION:  9311                          FACILITY:  [facility]  PHYSICIAN:  [name], [name].DATE OF BIRTH:  1961/02/21  DATE OF PROCEDURE:  01/09/2012 DATE OF DISCHARGE:  01/09/2012                              OPERATIVE REPORT   PREOPERATIVE DIAGNOSES:  Menorrhagia, uterine fibroids, lost intrauterine device.  PROCEDURE:  Da Vinci robotic total hysterectomy, bilateral salpingectomy, right ovarian cystectomy.  POSTOPERATIVE DIAGNOSES:  Menorrhagia, right ovarian cyst, uterine fibroids, lost intrauterine device.  ANESTHESIA:  General.  SURGEON:  [name], MD  ASSISTANT:  Lenoard Aden, MD  PROCEDURE:  Under adequate general anesthesia, the patient was placed in a dorsal lithotomy position.  She was positioned for robotic surgery. Examination under anesthesia revealed a uterus to the level of the umbilicus, mobile.  The patient was ChloraPrepped and Betadine prepped and draped in usual fashion.  Retractor was used in the vagina.  The cervix was noted to be short and small.  The anterior lip of the cervix grasped with a single-tooth tenaculum.  The posterior lip was also grasped with a single-tooth tenaculum.  Figure-of-eight 0 Vicryl sutures was placed on the anterior-posterior lip of the cervix.  The uterus sounded to 19 cm.  The cervix was serially dilated and a #12 uterine manipulator was inserted with a small RUMI cup into the uterine cavity for manipulation of the uterus.  Attention was then turned to the abdomen.  Based on the location of the uterus, a supraumbilical vertical incision was made about midway between the xiphoid and the umbilicus.  A 0.25% Marcaine was injected for local anesthesia.  Incision was then made, Veress needle was introduced and tested.  A 3 L of CO2 was insufflated, opening pressure of 6 was noted.  The Veress needle was then removed.  A  10-mm disposable trocar with sleeve was introduced into the abdomen without incident.  The robotic camera was then introduced into that port.  Inspection revealed atraumatic entry into the abdomen, normal liver edge, a large fibroid uterus.  Subsequently, there were two 8-mm port sites placed on the left, one 8-mm port site on the right, and a right lower quadrant 12-mm disposable trocar was introduced all under direct visualization, and after infiltrating with 0.25% Marcaine.  Once these were placed, the robot was docked to the port sites and the Prograsper was placed in #3, the monopolar scissors placed in #1, and the PK dissector was placed in #2.  The indwelling Foley catheter was draining yellow urine.  I then went to the surgical console.  At the surgical console, the pelvis was further inspected.  It was noted that the uterus was somewhat dextrorotated, a cystic mass on the right ovary and a large what appears to be probably a fundal fibroid. By displacing the uterus to the opposite side, the ureter was seen peristalsing.  There were very large varicosities  along the right side of the uterus. The procedure was started with the right utero-ovarian ligaments being clamped, cauterized, and cut, followed by the round ligament on the right with opening of the peritoneal space anteriorly and  posteriorly dissecting down further with the vessels being more prominent and notable.  The anterior vesicouterine peritoneum was also opened.  Once this was opened, the uterine vessels were serially clamped, cauterized, but not cut.  The attention was then turned to the opposite side.  Normal tubes and ovaries were noted on the left.  Ureter was noted to be peristalsing deep in the pelvis.  The round ligament on the left was splayed. Nonetheless, it was clamped, cauterized, and then cut.  The left utero- ovarian ligament was also clamped, cauterized, and then cut, and this was carried down to the  lower uterine segment.  The remaining portion of the vesicouterine peritoneum was opened anteriorly, and the bladder bluntly and sharply was dissected off the lower uterine segment.  The left uterine vessels were then serially clamped, cauterized, and cut at its lower junction.  Once this was done,  bladder reflection was further done going back to the contralateral side.  The uterine vessels were again cauterized though much more less prominent than they were before allowing for more cauterization to be performed.  Once this was felt to be in the vessels, the uterine vessels were completely cauterized and then subsequently cut.  The cervicovaginal junction was then opened anteriorly, carried around circumferentially, and then back to the right to approach posteriorly due to the fact of inability to anteflex the uterus due to the large fundal fibroid.  Once the cervix was severed from its vaginal attachment, the RUMI cup was removed.  The vaginal cuff was inspected.  Small bleeders cauterized.  The Prograsper was used to hold up the vaginal cuff.  When good hemostasis was noted, the vaginal cuff was closed with 0 Vicryl figure-of-eight sutures.  The abdomen was then irrigated.  There was bleeding noted but it was actually from the specimen itself.  With irrigation and suction, good hemostasis subsequently noted.  Decision was then made to address the right ovarian cyst.  The right adnexa was held on traction.  The monopolar scissor was used to open up the cyst wall.  Clear serous fluid was noted.  The cyst wall was then removed.  Small bleeders cauterized.  At that point, the procedure was felt to be complete with respect to the robotic portion of the case.  The robot was then undocked after the instruments were removed from its port sites.  I then returned to the patient's bedside with the right lower quadrant assistant port was replaced by a long Location manager.  The specimen was  morcellated and in total 1005 g worth of tissue was obtained.  It was then noted that the fallopian tubes were not removed, and therefore the gyrus tripolar cautery was then utilized to remove both fallopian tubes after serial clamping the mesosalpinx.  Once this was done, the abdomen was irrigated and suctioned of debris.  Good hemostasis noted.  The lower ports were then removed, and the supraumbilical site was then removed under direct visualization taking care not to bring up any underlying structures. Specimen was the morcellated  uterus and cervix, and right ovarian cyst wall, both fallopian tubes were sent to pathology.  Estimated blood loss was 300 mL.  Urine output was 325 mL clear yellow urine.  Intraoperative fluid 2 L.  Sponge and instrument counts x2 was correct.  Complication was none.  The patient tolerated the procedure well and was transferred to recovery room in stable condition.     [name], [name].     [place]/MEDQ  D:  01/09/2012  T:  01/10/2012  Job:  [id number]

## 2012-01-10 NOTE — Anesthesia Postprocedure Evaluation (Signed)
  Anesthesia Post-op Note  Patient: Tammy Avery  Procedure(s) Performed: Procedure(s) (LRB): ROBOTIC ASSISTED TOTAL HYSTERECTOMY (N/A) OVARIAN CYSTECTOMY (Right) INTRAUTERINE DEVICE (IUD) REMOVAL (N/A) BILATERAL SALPINGECTOMY (Bilateral)  Patient Location: 311  Anesthesia Type: General  Level of Consciousness: awake, alert  and oriented  Airway and Oxygen Therapy: Patient Spontanous Breathing  Post-op Pain: mild  Post-op Assessment: Post-op Vital signs reviewed and Patient's Cardiovascular Status Stable  Post-op Vital Signs: Reviewed and stable  Complications: No apparent anesthesia complications

## 2012-01-10 NOTE — Addendum Note (Signed)
Addendum  created 01/10/12 1332 by Karleen Dolphin, CRNA   Modules edited:Notes Section

## 2012-02-20 ENCOUNTER — Ambulatory Visit
Admission: RE | Admit: 2012-02-20 | Discharge: 2012-02-20 | Disposition: A | Discharge status: Home / Self Care | Payer: 59 | Source: Ambulatory Visit | Attending: Family Medicine | Admitting: Family Medicine

## 2012-02-20 ENCOUNTER — Other Ambulatory Visit: Payer: Self-pay | Admitting: Family Medicine

## 2012-02-20 DIAGNOSIS — R51 Headache: Secondary | ICD-10-CM

## 2012-02-20 MED ORDER — IOHEXOL 300 MG/ML  SOLN
75.0000 mL | Freq: Once | INTRAMUSCULAR | Status: AC | PRN
  Administered 2012-02-20: 75 mL via INTRAVENOUS

## 2012-11-27 ENCOUNTER — Other Ambulatory Visit: Payer: Self-pay | Admitting: Family Medicine

## 2012-11-27 DIAGNOSIS — Z1231 Encounter for screening mammogram for malignant neoplasm of breast: Secondary | ICD-10-CM

## 2012-12-28 ENCOUNTER — Ambulatory Visit
Admission: RE | Admit: 2012-12-28 | Discharge: 2012-12-28 | Disposition: A | Discharge status: Home / Self Care | Payer: 59 | Source: Ambulatory Visit | Attending: Family Medicine | Admitting: Family Medicine

## 2012-12-28 DIAGNOSIS — Z1231 Encounter for screening mammogram for malignant neoplasm of breast: Secondary | ICD-10-CM

## 2013-09-20 ENCOUNTER — Encounter: Payer: Self-pay | Admitting: Cardiology

## 2013-11-16 ENCOUNTER — Encounter: Payer: Self-pay | Admitting: Cardiology

## 2013-12-06 ENCOUNTER — Ambulatory Visit: Payer: 59 | Admitting: Cardiology

## 2013-12-27 ENCOUNTER — Ambulatory Visit (INDEPENDENT_AMBULATORY_CARE_PROVIDER_SITE_OTHER): Payer: 59 | Admitting: Cardiology

## 2013-12-27 ENCOUNTER — Encounter: Payer: Self-pay | Admitting: Cardiology

## 2013-12-27 VITALS — BP 148/98 | HR 83 | Wt 265.1 lb

## 2013-12-27 DIAGNOSIS — E785 Hyperlipidemia, unspecified: Secondary | ICD-10-CM

## 2013-12-27 DIAGNOSIS — I10 Essential (primary) hypertension: Secondary | ICD-10-CM | POA: Insufficient documentation

## 2013-12-27 DIAGNOSIS — I4460 Unspecified fascicular block: Secondary | ICD-10-CM

## 2013-12-27 DIAGNOSIS — R7309 Other abnormal glucose: Secondary | ICD-10-CM

## 2013-12-27 DIAGNOSIS — I444 Left anterior fascicular block: Secondary | ICD-10-CM

## 2013-12-27 DIAGNOSIS — R7303 Prediabetes: Secondary | ICD-10-CM

## 2013-12-27 NOTE — Patient Instructions (Signed)
Your physician recommends that you continue on your current medications as directed. Please refer to the Current Medication list given to you today.  Your physician wants you to follow-up in: 6 months with Dr. Marlou Porch. You will receive a reminder letter in the mail two months in advance. If you don't receive a letter, please call our office to schedule the follow-up appointment.  * Try to work on weight loss.*

## 2013-12-27 NOTE — Progress Notes (Signed)
[facility]. [address], [address], [place]  [address] Phone: [phone] Fax:  [phone]  Date:  12/27/2013   ID:  Tammy Avery, DOB October 20, 1961, MRN [id number]  PCP:  No primary provider on file.   History of Present Illness: Tammy Avery is a 53 y.o. female with hypertension, obesity here for six-month followup for evaluation of previously uncontrolled hypertension.   She is very diligent about taking her blood pressures and recording them on her phone. At one point, she was having quite low blood pressures with systolic in the 93-818 range. She transiently held her Aldactone she is now back on.   On June 01, 2013, I decided to discontinue her Marisa Severin because of concomitant use with angiotensin receptor blocker. We will continue with carvedilol and other medications. She has had some issues with kidney stones.  06/08/13-she is coming in because of some increasing nausea, lightheadedness, decrease in her blood pressure, decided to stop Aldactone. She admits that some of her blood pressures at home which have been excellent as low as 299 systolic have at times been off of the Aldactone.  08/06/13 - had some lower ext. edema. Took lasix for 7 days. Better.   12/27/13 - 134/89 at home 141/88. Stressed out, mother has Scientist, research (medical). Has gas pains with milk. No HA, no dizziness. Used to get HA with BP elevated.      Wt Readings from Last 3 Encounters:  12/27/13 265 lb 1.9 oz (120.258 kg)  01/09/12 225 lb (102.059 kg)  01/09/12 225 lb (102.059 kg)     Past Medical History  Diagnosis Date  . Hypertension   . Seasonal allergies   . Arthritis     knees, back, hands  . Hypothyroidism   . Hyperlipidemia   . Seizures      on meds - last one 10/18/11- meds increased  . Sleep apnea     Patient does not use CPAP  . Anemia   . Chronic kidney disease     kidney stones - no surgery required  . H/O hiatal hernia     no meds - no problems  . Pseudotumor cerebri syndrome     on  lasix  . Headache(784.0)   . Prediabetes   . Obesity, morbid   . Hypothyroidism     well controlled  . Osteoarthritis   . Hypercholesterolemia   . Transient alteration of awareness   . Polyarthropathy   . Joint pain   . Connective tissue disease   . Polyarthritis, inflammatory   . HLA B27 (HLA B27 positive)   . Vitamin D deficiency   . Spondylarthritis   . Sinusitis   . Radiculopathy   . Congenital cataract     left eye, blind in left eye  . Memory loss   . Chronic migraine   . Hypertensive retinopathy   . Uterine fibroid   . DJD (degenerative joint disease)     arthritis  . Kidney stones     Past Surgical History  Procedure Laterality Date  . Right knee arthroscopic  09/24/2006  . Back surgery  09/28/2007    Degenerative Disc Disease - surgery on  L3/4, L4/5  . Eye surgery  1975    left eye cataract surgery  . Tonsillectomy    . Ovarian cyst removal  01/09/2012    Procedure: OVARIAN CYSTECTOMY;  Surgeon: Marvene Staff, MD;  Location: [facility];  Service: Gynecology;  Laterality: Right;  . Iud  removal  01/09/2012    Procedure: INTRAUTERINE DEVICE (IUD) REMOVAL;  Surgeon: Marvene Staff, MD;  Location: [facility];  Service: Gynecology;  Laterality: N/A;  . Vesicovaginal fistula closure w/ tah      for fibroids    Current Outpatient Prescriptions  Medication Sig Dispense Refill  . acetaminophen (TYLENOL) 500 MG tablet Take 500 mg by mouth every 6 (six) hours as needed. Patient used this medication for pain.      . Azelaic Acid (FINACEA) 15 % cream Apply topically 2 (two) times daily. After skin is thoroughly washed and patted dry, gently but thoroughly massage a thin film of azelaic acid cream into the affected area twice daily, in the morning and evening.      . Biotin (RA BIOTIN) 2.5 MG CAPS Take 1 capsule by mouth daily.      . Calcium Carbonate (CALCIUM 600 PO) Take 2 tablets by mouth daily.      . carvedilol (COREG) 25 MG tablet Take 25 mg by mouth 2 (two)  times daily with a meal.      . Clobetasol & Clobetasol Emul 0.05 & 0.05 % MISC Apply 1 application topically 2 (two) times daily as needed.       Marland Kitchen CRANBERRY PO Take 2 capsules by mouth 3 (three) times daily.       . diclofenac sodium (VOLTAREN) 1 % GEL Apply 1 application topically 4 (four) times daily as needed.       . furosemide (LASIX) 20 MG tablet Take 20 mg by mouth daily.      . furosemide (LASIX) 40 MG tablet Take 40 mg by mouth daily.      Marland Kitchen gabapentin (NEURONTIN) 100 MG capsule Take 100 mg by mouth 3 (three) times daily.      . Glucos-Chondroit-Collag-Hyal (GLUCOSAMINE CHONDROIT-COLLAGEN) CAPS Take two 2700 mg capsules once a day      . HYDROcodone-acetaminophen (VICODIN) 5-500 MG per tablet Take 1 tablet by mouth every 6 (six) hours as needed. Patient uses this medication for pain.      . hydroxychloroquine (PLAQUENIL) 200 MG tablet Take 200 mg by mouth 2 (two) times daily.      Marland Kitchen levothyroxine (SYNTHROID, LEVOTHROID) 75 MCG tablet Take 75 mcg by mouth daily.      Marland Kitchen loratadine (CLARITIN) 10 MG tablet Take 10 mg by mouth daily.      Marland Kitchen losartan (COZAAR) 100 MG tablet Take 100 mg by mouth daily.      . Multiple Vitamin (MULTIVITAMIN) tablet Take 2 tablets by mouth daily.      . NON FORMULARY (Qnasal Nasal Aerosol - 80 mcg per spray) 2 sprays once a day      . Omega-3 Fatty Acids (FISH OIL) 1000 MG CAPS Take 3 capsules by mouth daily.       . potassium chloride (K-DUR) 10 MEQ tablet Take 10 mEq by mouth 2 (two) times daily.      . pravastatin (PRAVACHOL) 40 MG tablet Take 40 mg by mouth daily.      Marland Kitchen pyridoxine (B-6) 100 MG tablet Take 100 mg by mouth daily.      Marland Kitchen spironolactone (ALDACTONE) 25 MG tablet Take 25 mg by mouth daily.      Marland Kitchen sulfacetamide (BLEPH-10) 10 % ophthalmic solution 1 drop every 6 (six) hours.      . vitamin B-12 (CYANOCOBALAMIN) 1000 MCG tablet Take 500 mcg by mouth daily.       . Vitamin D, Cholecalciferol, 1000  UNITS TABS Take 1 tablet by mouth daily.      Marland Kitchen  zonisamide (ZONEGRAN) 100 MG capsule Take 300 mg by mouth daily.       No current facility-administered medications for this visit.    Allergies:    Allergies  Allergen Reactions  . Benadryl [Diphenhydramine] Hives and Other (See Comments)    fatigue  . Lactose Intolerance (Gi) Diarrhea  . Soma [Carisoprodol] Other (See Comments)    seizures  . Codeine Rash    Patient states that Codeine in liquid form causes her rash reaction.  But other forms of codeine are okay for patient to take.  . Latex Rash  . Morphine And Related Itching and Rash  . Penicillins Rash    Social History:  The patient  reports that she has never smoked. She has never used smokeless tobacco. She reports that she does not drink alcohol or use illicit drugs.   ROS:  Please see the history of present illness.   Denies any fevers, chills, orthopnea, PND, chest pain, shortness of breath    PHYSICAL EXAM: VS:  BP 148/98  Pulse 83  Wt 265 lb 1.9 oz (120.258 kg)  LMP 12/13/2011 Well nourished, well developed, in no acute distress HEENT: normal Neck: no JVD Cardiac:  normal S1, S2; RRR; no murmur Lungs:  clear to auscultation bilaterally, no wheezing, rhonchi or rales Abd: soft, nontender, no hepatomegalyObese Ext: no edema Skin: warm and dry Neuro: no focal abnormalities noted  EKG:  Normal sinus rhythm heart rate 83 with left anterior fascicular block, nonspecific ST flattening.      ASSESSMENT AND PLAN:  1. Difficult to control hypertension-multidrug regimen. At one point on Tekturna. 2. Hyperlipidemia-currently on statin therapy. 3. Morbid obesity-continue to encourage weight loss. She's gained several pounds in the middle of stressful situation taking care of her mother with dementia. We discussed this at length, risks of worsening diabetes, hypertension. 4. Left anterior fascicular block-no high risk symptoms like syncope.   5. Prediabetes-weight loss. 6. She's going to get blood work with her primary  physician, Dr. Chapman Fitch next week. I will see her back in 6 months.   Signed, Candee Furbish, MD [facility]  12/27/2013 8:47 AM

## 2014-01-12 ENCOUNTER — Encounter: Payer: Self-pay | Admitting: Cardiology

## 2014-07-13 ENCOUNTER — Other Ambulatory Visit: Payer: Self-pay | Admitting: Cardiology

## 2014-08-22 ENCOUNTER — Other Ambulatory Visit: Payer: Self-pay | Admitting: Cardiology

## 2014-09-23 ENCOUNTER — Other Ambulatory Visit: Payer: Self-pay | Admitting: Cardiology

## 2015-04-10 LAB — BASIC METABOLIC PANEL
BUN: 14 (ref 4–21)
Creatinine: 0.8 (ref 0.5–1.1)
Glucose: 85
Sodium: 139 (ref 137–147)

## 2015-04-10 LAB — HEMOGLOBIN A1C: Hemoglobin A1C: 5.6

## 2015-04-10 LAB — TSH: TSH: 1.63 (ref 0.41–5.90)

## 2015-04-10 LAB — CBC AND DIFFERENTIAL
HCT: 41 (ref 36–46)
Hemoglobin: 13.5 (ref 12.0–16.0)
Platelets: 258 (ref 150–399)
WBC: 4.6

## 2015-04-10 LAB — VITAMIN D 25 HYDROXY (VIT D DEFICIENCY, FRACTURES): Vit D, 25-Hydroxy: 36.4

## 2015-04-10 LAB — HEPATIC FUNCTION PANEL: AST: 23 (ref 13–35)

## 2015-04-11 ENCOUNTER — Other Ambulatory Visit: Payer: Self-pay

## 2015-04-11 DIAGNOSIS — Z1231 Encounter for screening mammogram for malignant neoplasm of breast: Secondary | ICD-10-CM

## 2015-05-08 ENCOUNTER — Ambulatory Visit
Admission: RE | Admit: 2015-05-08 | Discharge: 2015-05-08 | Disposition: A | Discharge status: Home / Self Care | Payer: 59 | Source: Ambulatory Visit

## 2015-05-08 DIAGNOSIS — Z1231 Encounter for screening mammogram for malignant neoplasm of breast: Secondary | ICD-10-CM

## 2015-05-10 ENCOUNTER — Ambulatory Visit (INDEPENDENT_AMBULATORY_CARE_PROVIDER_SITE_OTHER): Payer: 59 | Admitting: Podiatry

## 2015-05-10 VITALS — BP 185/103 | HR 73 | Resp 16 | Wt 250.0 lb

## 2015-05-10 DIAGNOSIS — L600 Ingrowing nail: Secondary | ICD-10-CM | POA: Diagnosis not present

## 2015-05-10 DIAGNOSIS — M722 Plantar fascial fibromatosis: Secondary | ICD-10-CM

## 2015-05-10 DIAGNOSIS — B351 Tinea unguium: Secondary | ICD-10-CM | POA: Diagnosis not present

## 2015-05-10 DIAGNOSIS — L03032 Cellulitis of left toe: Secondary | ICD-10-CM

## 2015-05-10 NOTE — Progress Notes (Signed)
Subjective:     Patient ID: Tammy Avery, female   DOB: 1961/03/16, 54 y.o.   MRN: [id number]  HPIThis patient returns to my office with myriad of concerns.  She presents to the office wearing a cam walker.  She has neen treated with acute plantar fascitis due to her flatfeet.  She says she wears regular shoes at work but cam walker outside work.  She also is concerned about her nail growth for possible fungus to her nails.  Finally she has infected ingrown toenail on the outside border left big toe left foot.  She desires definitive evaluation and treatment of this nail.   Review of Systems     Objective:   Physical Exam   Podiatric Exam: Vascular: dorsalis pedis and posterior tibial pulses are palpable bilateral. Capillary return is immediate. Temperature gradient is WNL. Skin turgor WNL  Sensorium: Normal Semmes Weinstein monofilament test. Normal tactile sensation bilaterally. Nail Exam: Thick disfigured and discolored nails on both feet.  She has marked incurvation lateral border left great toe with redness and swelling and pain. Ulcer Exam: There is no evidence of ulcer or pre-ulcerative changes or infection. Orthopedic Exam: Muscle tone and strength are WNL. No limitations in general ROM. No crepitus or effusions noted. Foot type and digits show no abnormalities. Bony prominences are unremarkable. Severe pronatory flatfoot both feet.  Pain is reduced upon palpation. Skin: No Porokeratosis. No infection or ulcers.     Assessment:     Plantar Fasciitis left foot due to flatfeet.  2. Onychomycosis B/L  3.  Paronychia lateral border left great toe     Plan:     ROV.  Continue with cam walker as needed.  Discontinue Mobic since she has reactions.  Spenco insoles were prescribed for her to pick up at [employer].  Positive results on DTM medium.  Told her to have bloodwork sent over so I can check on her liver function and then I will send in lamisil prescription.  Excision of nail mand  matrix lateral border left great toe under local anesthesia.  Neosporin/DSD  Home instructions given.

## 2015-05-17 ENCOUNTER — Ambulatory Visit (INDEPENDENT_AMBULATORY_CARE_PROVIDER_SITE_OTHER): Payer: 59 | Admitting: Podiatry

## 2015-05-17 VITALS — BP 170/103 | HR 75 | Resp 12

## 2015-05-17 DIAGNOSIS — B351 Tinea unguium: Secondary | ICD-10-CM

## 2015-05-17 DIAGNOSIS — M722 Plantar fascial fibromatosis: Secondary | ICD-10-CM | POA: Diagnosis not present

## 2015-05-17 MED ORDER — TERBINAFINE HCL 250 MG PO TABS: 250.0000 mg | ORAL_TABLET | Freq: Every day | ORAL | Status: DC

## 2015-05-17 NOTE — Progress Notes (Signed)
Patient ID: Tammy Avery, female   DOB: [birth year], 54 y.o.   MRN: [id number] Patient states that her left foot is much better she noticed 2 days ago it wasn't hurting to walk anymore and her left great toe is healing well. This patient presents to the office saying her arch of her foot is better and the nail is healing well.  She presents wearing a bandage.  No pain noted to outside border great toe.    Objective: Review of past medical history, medications, social history and allergies were performed.  Vascular: Dorsalis pedis and posterior tibial pulses were palpable B/L, capillary refill was  WNL B/L, temperature gradient was WNL B/L   Skin:  No signs of symptoms of infection or ulcers on both feet  Nails: Necrotic tissue at surgical site is present but no signs of redness or infection noted.  Sensory: Thornell Mule monifilament WNL   Orthopedic: Orthopedic evaluation demonstrates all joints distal t ankle have full ROM without cr epitus, muscle power WNL B/L  A. Plantar fascitis  S/p nail surgery  Onychomycosis  ROV.  Continue with cam walker and home soaks.  Called in Lamisil for her to take.  She brought in bloodwork with normal liver testing.

## 2015-07-03 ENCOUNTER — Ambulatory Visit: Payer: Self-pay | Admitting: Cardiology

## 2015-07-05 ENCOUNTER — Ambulatory Visit: Payer: Self-pay | Admitting: Cardiology

## 2015-07-27 LAB — BASIC METABOLIC PANEL
BUN: 11 (ref 4–21)
Creatinine: 0.8 (ref 0.5–1.1)
Glucose: 91
Potassium: 3.9 (ref 3.4–5.3)
Sodium: 140 (ref 137–147)

## 2015-07-27 LAB — CBC AND DIFFERENTIAL
HCT: 42 (ref 36–46)
Hemoglobin: 13.9 (ref 12.0–16.0)
Platelets: 251 (ref 150–399)
WBC: 7

## 2015-07-27 LAB — HEPATIC FUNCTION PANEL
ALT: 25 (ref 7–35)
AST: 18 (ref 13–35)
Alkaline Phosphatase: 81 (ref 25–125)
Bilirubin, Total: 0.4

## 2015-08-10 ENCOUNTER — Ambulatory Visit (INDEPENDENT_AMBULATORY_CARE_PROVIDER_SITE_OTHER): Payer: 59 | Admitting: Cardiology

## 2015-08-10 ENCOUNTER — Encounter: Payer: Self-pay | Admitting: Cardiology

## 2015-08-10 VITALS — BP 140/86 | HR 79 | Ht 63.5 in | Wt 245.4 lb

## 2015-08-10 DIAGNOSIS — I444 Left anterior fascicular block: Secondary | ICD-10-CM | POA: Diagnosis not present

## 2015-08-10 DIAGNOSIS — R0789 Other chest pain: Secondary | ICD-10-CM | POA: Insufficient documentation

## 2015-08-10 DIAGNOSIS — I10 Essential (primary) hypertension: Secondary | ICD-10-CM

## 2015-08-10 DIAGNOSIS — E785 Hyperlipidemia, unspecified: Secondary | ICD-10-CM

## 2015-08-10 NOTE — Progress Notes (Signed)
[facility]. [address], [place]  [address] Phone: [phone] Fax:  [phone]  Date:  08/10/2015   ID:  Tammy Avery, DOB [birth year], MRN [id number]  PCP:  Antony Blackbird, MD   History of Present Illness: Tammy Avery is a 54 y.o. female with hypertension, obesity here for six-month followup for evaluation of previously uncontrolled hypertension.   She is very diligent about taking her blood pressures and recording them on her phone. At one point, she was having quite low blood pressures with systolic in the 97-948 range. She transiently held her Aldactone she is now back on.   On June 01, 2013, I decided to discontinue her Marisa Severin because of concomitant use with angiotensin receptor blocker. We will continue with carvedilol and other medications. She has had some issues with kidney stones.  06/08/13-she is coming in because of some increasing nausea, lightheadedness, decrease in her blood pressure, decided to stop Aldactone. She admits that some of her blood pressures at home which have been excellent as low as 016 systolic have at times been off of the Aldactone.  08/06/13 - had some lower ext. edema. Took lasix for 7 days. Better.   12/27/13 - 134/89 at home 141/88. Stressed out, mother has Scientist, research (medical). Has gas pains with milk. No HA, no dizziness. Used to get HA with BP elevated.   08/10/15 - RLQ pain. Diverticulitis. Flagyl, Cipro, sick, anti nausea. BP was haywire. Cold and hot. Mother had MI. 8. She has felt some chest discomfort, quite atypical at times, sometimes she thinks that this is gas pain. Does not seem to be exertional. Mild to moderate in intensity.     Wt Readings from Last 3 Encounters:  08/10/15 245 lb 6.4 oz (111.313 kg)  05/10/15 250 lb (113.399 kg)  12/27/13 265 lb 1.9 oz (120.258 kg)     Past Medical History  Diagnosis Date  . Hypertension   . Seasonal allergies   . Arthritis     knees, back, hands  . Hypothyroidism   .  Hyperlipidemia   . Seizures      on meds - last one 10/18/11- meds increased  . Sleep apnea     Patient does not use CPAP  . Anemia   . Chronic kidney disease     kidney stones - no surgery required  . H/O hiatal hernia     no meds - no problems  . Pseudotumor cerebri syndrome     on lasix  . Headache(784.0)   . Prediabetes   . Obesity, morbid   . Hypothyroidism     well controlled  . Osteoarthritis   . Hypercholesterolemia   . Transient alteration of awareness   . Polyarthropathy   . Joint pain   . Connective tissue disease   . Polyarthritis, inflammatory   . HLA B27 (HLA B27 positive)   . Vitamin D deficiency   . Spondylarthritis   . Sinusitis   . Radiculopathy   . Congenital cataract     left eye, blind in left eye  . Memory loss   . Chronic migraine   . Hypertensive retinopathy   . Uterine fibroid   . DJD (degenerative joint disease)     arthritis  . Kidney stones     Past Surgical History  Procedure Laterality Date  . Right knee arthroscopic  09/24/2006  . Back surgery  09/28/2007    Degenerative Disc Disease - surgery on  L3/4, L4/5  .  Eye surgery  1975    left eye cataract surgery  . Tonsillectomy    . Ovarian cyst removal  01/09/2012    Procedure: OVARIAN CYSTECTOMY;  Surgeon: Marvene Staff, MD;  Location: [facility];  Service: Gynecology;  Laterality: Right;  . Iud removal  01/09/2012    Procedure: INTRAUTERINE DEVICE (IUD) REMOVAL;  Surgeon: Marvene Staff, MD;  Location: [facility];  Service: Gynecology;  Laterality: N/A;  . Vesicovaginal fistula closure w/ tah      for fibroids    Current Outpatient Prescriptions  Medication Sig Dispense Refill  . acetaminophen (TYLENOL) 500 MG tablet Take 500 mg by mouth every 6 (six) hours as needed. Patient used this medication for pain.    . Azelaic Acid (FINACEA) 15 % cream Apply topically 2 (two) times daily. After skin is thoroughly washed and patted dry, gently but thoroughly massage a thin film of  azelaic acid cream into the affected area twice daily, in the morning and evening.    . Biotin (RA BIOTIN) 2.5 MG CAPS Take 1 capsule by mouth daily.    . Calcium Carbonate (CALCIUM 600 PO) Take 2 tablets by mouth daily.    . carvedilol (COREG) 25 MG tablet TAKE 1 TABLET BY MOUTH TWICE DAILY 60 tablet 0  . Clobetasol & Clobetasol Emul 0.05 & 0.05 % MISC Apply 1 application topically 2 (two) times daily as needed.     Marland Kitchen CRANBERRY PO Take 2 capsules by mouth 3 (three) times daily.     . diclofenac sodium (VOLTAREN) 1 % GEL Apply 1 application topically 4 (four) times daily as needed.     . furosemide (LASIX) 20 MG tablet TK 1 T PO  QD  3  . furosemide (LASIX) 40 MG tablet Take 40 mg by mouth daily.    Marland Kitchen gabapentin (NEURONTIN) 300 MG capsule TK 1 C PO HS  4  . Glucos-Chondroit-Collag-Hyal (GLUCOSAMINE CHONDROIT-COLLAGEN) CAPS Take two 2700 mg capsules once a day    . hydroxychloroquine (PLAQUENIL) 200 MG tablet Take 200 mg by mouth 2 (two) times daily.    Marland Kitchen levothyroxine (SYNTHROID, LEVOTHROID) 75 MCG tablet Take 75 mcg by mouth daily.    Marland Kitchen loratadine (CLARITIN) 10 MG tablet Take 10 mg by mouth daily.    Marland Kitchen losartan (COZAAR) 100 MG tablet Take 100 mg by mouth daily.    . Multiple Vitamin (MULTIVITAMIN) tablet Take 2 tablets by mouth daily.    . NON FORMULARY (Qnasal Nasal Aerosol - 80 mcg per spray) 2 sprays once a day    . Omega-3 Fatty Acids (FISH OIL) 1000 MG CAPS Take 3 capsules by mouth daily.     Marland Kitchen omeprazole (PRILOSEC) 40 MG capsule TK 1 C PO D TO REDUCE STOMACH ACID  5  . potassium chloride (K-DUR) 10 MEQ tablet Take 10 mEq by mouth 2 (two) times daily.    . pravastatin (PRAVACHOL) 40 MG tablet Take 40 mg by mouth daily.    Marland Kitchen terbinafine (LAMISIL) 250 MG tablet Take 1 tablet (250 mg total) by mouth daily. 90 tablet 0  . Vitamin D, Cholecalciferol, 1000 UNITS TABS Take 500 Units by mouth daily.     Marland Kitchen zonisamide (ZONEGRAN) 100 MG capsule Take 300 mg by mouth daily.     No current  facility-administered medications for this visit.    Allergies:    Allergies  Allergen Reactions  . Benadryl [Diphenhydramine] Hives and Other (See Comments)    fatigue  . Lactose Intolerance (Gi)  Diarrhea  . Soma [Carisoprodol] Other (See Comments)    seizures  . Codeine Rash    Patient states that Codeine in liquid form causes her rash reaction.  But other forms of codeine are okay for patient to take.  . Latex Rash  . Morphine And Related Itching and Rash  . Penicillins Rash    Social History:  The patient  reports that she has never smoked. She has never used smokeless tobacco. She reports that she does not drink alcohol or use illicit drugs.   ROS:  Please see the history of present illness.   Denies any fevers, chills, orthopnea, PND, chest pain, shortness of breath    PHYSICAL EXAM: VS:  BP 140/86 mmHg  Pulse 79  Ht 5' 3.5" (1.613 m)  Wt 245 lb 6.4 oz (111.313 kg)  BMI 42.78 kg/m2  LMP 12/13/2011 Well nourished, well developed, in no acute distress HEENT: normal Neck: no JVD Cardiac:  normal S1, S2; RRR; no murmur Lungs:  clear to auscultation bilaterally, no wheezing, rhonchi or rales Abd: soft, nontender, no hepatomegalyObese Ext: no edema Skin: warm and dry Neuro: no focal abnormalities noted  EKG:  Today 08/10/15-sinus rhythm, 79, left anterior fascicular block, T-wave inversion in lead 3, subtly and aVF, V3, V4, V5, V6. J-point elevation in 1 and aVL., Poor R-wave progression personally viewed-prior Normal sinus rhythm heart rate 83 with left anterior fascicular block, nonspecific ST flattening.   LDL 79, HDL 71, creatinine 0.8     ASSESSMENT AND PLAN:  1. Atypical chest pain-like to check a nuclear stress test, 2 day study, pharmacologic because of her prior poor knees and ankle. Understands reduce sensitivity. 2. Difficult to control hypertension-multidrug regimen. At one point on Tekturna. Overall doing well on current regimen. No  changes. 3. Hyperlipidemia-currently on statin therapy. Excellent 4. Morbid obesity-continue to encourage weight loss. She's gained several pounds in the middle of stressful situation taking care of her mother with dementia. We discussed this at length, risks of worsening diabetes, hypertension. 5. Left anterior fascicular block-no high risk symptoms like syncope.  EKG relatively unchanged from prior. 6. Prediabetes-weight loss. 7. I will see her back in 6 months. (We will follow-up with results of testing)  Signed, Candee Furbish, MD [facility]  08/10/2015 3:14 PM

## 2015-08-10 NOTE — Patient Instructions (Signed)
Medication Instructions:  .instuc   Labwork: none  Testing/Procedures: Your physician has requested that you have a lexiscan myoview. For further information please visit [email or web address]. Please follow instruction sheet, as given.  Follow-Up: Your physician wants you to follow-up in: 6 month ov You will receive a reminder letter in the mail two months in advance. If you don't receive a letter, please call our office to schedule the follow-up appointment.

## 2015-08-14 ENCOUNTER — Telehealth (HOSPITAL_COMMUNITY): Payer: Self-pay | Admitting: *Deleted

## 2015-08-14 NOTE — Telephone Encounter (Signed)
Patient given detailed instructions per Myocardial Perfusion Study Information Sheet for test on 08/16/15 at 1130. Patient notified to arrive 15 minutes early and that it is imperative to arrive on time for appointment to keep from having the test rescheduled.  If you need to cancel or reschedule your appointment, please call the office within 24 hours of your appointment. Failure to do so may result in a cancellation of your appointment, and a $50 no show fee. Patient verbalized understanding. Leatrice Parilla, Ranae Palms

## 2015-08-16 ENCOUNTER — Ambulatory Visit (HOSPITAL_COMMUNITY): Payer: 59 | Attending: Cardiology

## 2015-08-16 DIAGNOSIS — R079 Chest pain, unspecified: Secondary | ICD-10-CM | POA: Diagnosis not present

## 2015-08-16 DIAGNOSIS — E785 Hyperlipidemia, unspecified: Secondary | ICD-10-CM | POA: Diagnosis not present

## 2015-08-16 DIAGNOSIS — I10 Essential (primary) hypertension: Secondary | ICD-10-CM | POA: Diagnosis not present

## 2015-08-16 DIAGNOSIS — I444 Left anterior fascicular block: Secondary | ICD-10-CM | POA: Diagnosis not present

## 2015-08-16 MED ORDER — TECHNETIUM TC 99M SESTAMIBI GENERIC - CARDIOLITE
32.9000 | Freq: Once | INTRAVENOUS | Status: AC | PRN
  Administered 2015-08-16: 32.9 via INTRAVENOUS

## 2015-08-16 MED ORDER — REGADENOSON 0.4 MG/5ML IV SOLN
0.4000 mg | Freq: Once | INTRAVENOUS | Status: AC
  Administered 2015-08-16: 0.4 mg via INTRAVENOUS

## 2015-08-17 ENCOUNTER — Ambulatory Visit (HOSPITAL_COMMUNITY): Payer: 59 | Attending: Cardiology

## 2015-08-17 DIAGNOSIS — R0989 Other specified symptoms and signs involving the circulatory and respiratory systems: Secondary | ICD-10-CM

## 2015-08-17 LAB — MYOCARDIAL PERFUSION IMAGING
LV dias vol: 110 mL
LV sys vol: 49 mL
Peak HR: 96 {beats}/min
RATE: 0.34
Rest HR: 73 {beats}/min
SDS: 0
SRS: 0
SSS: 0
TID: 1.02

## 2015-08-17 MED ORDER — TECHNETIUM TC 99M SESTAMIBI GENERIC - CARDIOLITE
31.5000 | Freq: Once | INTRAVENOUS | Status: AC | PRN
  Administered 2015-08-17: 32 via INTRAVENOUS

## 2015-08-21 ENCOUNTER — Other Ambulatory Visit: Payer: Self-pay | Admitting: Podiatry

## 2015-08-21 NOTE — Telephone Encounter (Signed)
Refill request for Terbinafine.  Pt has completed 90 doses of the medication.  Informed pt that she has completed the therapeutic amount of the medication and it would take 6-9 months to see and improvement.  Pt states understanding.

## 2015-08-29 LAB — HM COLONOSCOPY

## 2015-12-13 ENCOUNTER — Ambulatory Visit (INDEPENDENT_AMBULATORY_CARE_PROVIDER_SITE_OTHER): Payer: 59 | Admitting: Podiatry

## 2015-12-13 ENCOUNTER — Encounter: Payer: Self-pay | Admitting: Podiatry

## 2015-12-13 VITALS — BP 182/105 | HR 77 | Resp 16

## 2015-12-13 DIAGNOSIS — B351 Tinea unguium: Secondary | ICD-10-CM

## 2015-12-13 DIAGNOSIS — L600 Ingrowing nail: Secondary | ICD-10-CM

## 2015-12-13 NOTE — Progress Notes (Signed)
Subjective:     Patient ID: Tammy Avery, female   DOB: 27-Jul-1961, 55 y.o.   MRN: [id number]  HPI   Review of Systems     Objective:   Physical Exam GENERAL APPEARANCE: Alert, conversant. Appropriately groomed. No acute distress.  VASCULAR: Pedal pulses palpable at  [facility] and PT bilateral.  Capillary refill time is immediate to all digits,  Normal temperature gradient.  Digital hair growth is present bilateral  NEUROLOGIC: sensation is normal to 5.07 monofilament at 5/5 sites bilateral.  Light touch is intact bilateral, Muscle strength normal.  MUSCULOSKELETAL: acceptable muscle strength, tone and stability bilateral.  Intrinsic muscluature intact bilateral.  Rectus appearance of foot and digits noted bilateral.   DERMATOLOGIC: skin color, texture, and turgor are within normal limits.  No preulcerative lesions or ulcers  are seen, no interdigital maceration noted.  No open lesions present.   No drainage noted.  Nails  Thick disfigured discolored onychomycotic nail with incurvation noted nail right foot.     Assessment:     Inychomycosis right hallux.  Ingrown nail right hallux     Plan:    ROV  Nail surgery  Treatment options and alternatives discussed.  Recommended permanent phenol matrixectomy and patient agreed.  Right hallux  was prepped with alcohol and a toe block of 3cc of 2% lidocaine plain was administered in a digital toe block. .  The toe was then prepped with betadine solution .  The offending nail  was then excised and matrix tissue exposed.  Phenol was then applied to the matrix tissue followed by an alcohol wash.  Antibiotic ointment and a dry sterile dressing was applied.  The patient was dispensed instructions for aftercare. RTC 1 week.     Gardiner Barefoot DPM

## 2015-12-15 ENCOUNTER — Telehealth: Payer: Self-pay | Admitting: *Deleted

## 2015-12-15 NOTE — Telephone Encounter (Signed)
Pt states she needs an additional day off after her ingrown toenail procedure, and would go back to work on 12/16/2015 and pick the note up at the [employer] office.  I told pt I would write the note but she would have to pick it up at the [place]. Office. Pt states understanding.

## 2015-12-21 ENCOUNTER — Ambulatory Visit (INDEPENDENT_AMBULATORY_CARE_PROVIDER_SITE_OTHER): Payer: 59 | Admitting: Podiatry

## 2015-12-21 ENCOUNTER — Encounter: Payer: Self-pay | Admitting: Podiatry

## 2015-12-21 VITALS — BP 161/92 | HR 71 | Resp 16

## 2015-12-21 DIAGNOSIS — Z09 Encounter for follow-up examination after completed treatment for conditions other than malignant neoplasm: Secondary | ICD-10-CM

## 2015-12-21 NOTE — Progress Notes (Signed)
Patient ID: [name], female   DOB: July 10, 1961, 55 y.o.   MRN: [id number] This patient returns to the office following nail surgery one week ago.  The patient says toe has been soaked and bandaged as directed.  There has been improvement of the toe since the surgery has been performed. The patient presents for continued evaluation and treatment. She has been in pain since her surgery which has lessened except now in shoes.  GENERAL APPEARANCE: Alert, conversant. Appropriately groomed. No acute distress.  VASCULAR: Pedal pulses palpable at  [facility] and PT bilateral.  Capillary refill time is immediate to all digits,  Normal temperature gradient.    NEUROLOGIC: sensation is normal to 5.07 monofilament at 5/5 sites bilateral.  Light touch is intact bilateral, Muscle strength normal.  MUSCULOSKELETAL: acceptable muscle strength, tone and stability bilateral.  Intrinsic muscluature intact bilateral.  Rectus appearance of foot and digits noted bilateral.   DERMATOLOGIC: skin color, texture, and turgor are within normal limits.  No preulcerative lesions or ulcers  are seen, no interdigital maceration noted.   NAILS  There is necrotic tissue along the nail grooves  In the absence of redness swelling and pain.  DX  S/p nail surgery  ROV  Home instructions were discussed.  Patient to call the office if there are any questions or concerns. RTC 2 weeks.   Gardiner Barefoot DPM

## 2016-01-04 ENCOUNTER — Encounter: Payer: Self-pay | Admitting: Podiatry

## 2016-01-04 ENCOUNTER — Ambulatory Visit (INDEPENDENT_AMBULATORY_CARE_PROVIDER_SITE_OTHER): Payer: 59 | Admitting: Podiatry

## 2016-01-04 VITALS — BP 191/109 | HR 83 | Resp 14

## 2016-01-04 DIAGNOSIS — Z09 Encounter for follow-up examination after completed treatment for conditions other than malignant neoplasm: Secondary | ICD-10-CM

## 2016-01-04 MED ORDER — DOXYCYCLINE HYCLATE 100 MG PO TABS: 100.0000 mg | ORAL_TABLET | Freq: Two times a day (BID) | ORAL | Status: DC

## 2016-01-04 NOTE — Progress Notes (Signed)
Subjective:     Patient ID: Tammy Avery, female   DOB: Feb 10, 1961, 55 y.o.   MRN: [id number]  HPI this patient returns to the office for weeks status post an excision with cauterization of the toenail, right hallux. She presents the office today stating she still in significant pain and discomfort and has been unable to return to work. She says she has been soaking and bandaging her toe as directed. She presents the office for continued evaluation and treatment   Review of Systems     Objective:   Physical Exam GENERAL APPEARANCE: Alert, conversant. Appropriately groomed. No acute distress.  VASCULAR: Pedal pulses palpable at  [facility] and PT bilateral.  Capillary refill time is immediate to all digits,  Normal temperature gradient.  Digital hair growth is present bilateral  NEUROLOGIC: sensation is normal to 5.07 monofilament at 5/5 sites bilateral.  Light touch is intact bilateral, Muscle strength normal.  MUSCULOSKELETAL: acceptable muscle strength, tone and stability bilateral.  Intrinsic muscluature intact bilateral.  Rectus appearance of foot and digits noted bilateral.   DERMATOLOGIC: skin color, texture, and turgor are within normal limits.  No preulcerative lesions or ulcers  are seen, no interdigital maceration noted.  No open lesions present.   NAILS  Her nail bed right hallux is inflamed and painful  Purplish discoloration at distal aspect of toe.  No redness or swelling or pain at proximal nail fold.     Assessment:     S/p nail surgery     Plan:     ROV  Continue soaks.  D/C  Neosporin.  Prescribed doxycycline.  RTC 2 weeks.   Gardiner Barefoot DPM

## 2016-01-08 DIAGNOSIS — Z09 Encounter for follow-up examination after completed treatment for conditions other than malignant neoplasm: Secondary | ICD-10-CM

## 2016-01-15 LAB — HEPATIC FUNCTION PANEL
ALT: 14 (ref 7–35)
AST: 15 (ref 13–35)
Alkaline Phosphatase: 75 (ref 25–125)
Bilirubin, Total: 0.3

## 2016-01-15 LAB — CBC AND DIFFERENTIAL
HCT: 41 (ref 36–46)
Hemoglobin: 13.5 (ref 12.0–16.0)
Platelets: 254 (ref 150–399)
WBC: 5.2

## 2016-01-15 LAB — BASIC METABOLIC PANEL
BUN: 12 (ref 4–21)
Creatinine: 0.8 (ref 0.5–1.1)
Glucose: 81
Potassium: 4 (ref 3.4–5.3)
Sodium: 138 (ref 137–147)

## 2016-01-18 ENCOUNTER — Ambulatory Visit (INDEPENDENT_AMBULATORY_CARE_PROVIDER_SITE_OTHER): Payer: 59 | Admitting: Podiatry

## 2016-01-18 ENCOUNTER — Encounter: Payer: Self-pay | Admitting: Podiatry

## 2016-01-18 VITALS — BP 163/92 | HR 78 | Resp 14

## 2016-01-18 DIAGNOSIS — Z09 Encounter for follow-up examination after completed treatment for conditions other than malignant neoplasm: Secondary | ICD-10-CM

## 2016-01-18 NOTE — Progress Notes (Signed)
Subjective:     Patient ID: Tammy Avery, female   DOB: 16-Jun-1961, 55 y.o.   MRN: [id number]  HPI this patient presents the office following a removal of her right great toenail. She developed in an inflammation either an infection and or reaction to the Neosporin. She says that her toe has improved now that we discontinued the Neosporin and use Betadine as the ointment. She says she has  also taken  the antibiotics as directed. She  presents the office today stating that the toenail is getting there, and she is able to walk better in her shoes. She states there is minimal drainage at this point. She presents the office for an evaluation and treatment of this condition   Review of Systems     Objective:   Physical Examneurovascular status is intact.  There is no signs of redness or swelling or drainge from the surgical site. Black inflammatory skin noted at proximal nail fold.  The nails bed in drying at this time.     Assessment:     S/p surgery     Plan:     ROV  Continue soaks and bandages until drainage stops.  RTC prn   Gardiner Barefoot DPM

## 2016-01-19 ENCOUNTER — Encounter: Payer: Self-pay | Admitting: Physician Assistant

## 2016-03-05 LAB — LIPID PANEL
Cholesterol: 184 (ref 0–200)
HDL: 79 — AB (ref 35–70)
LDL Cholesterol: 84
Triglycerides: 106 (ref 40–160)

## 2016-03-05 LAB — TSH: TSH: 1.59 (ref 0.41–5.90)

## 2016-03-05 LAB — HEMOGLOBIN A1C: Hemoglobin A1C: 5.7

## 2016-03-05 LAB — VITAMIN D 25 HYDROXY (VIT D DEFICIENCY, FRACTURES): Vit D, 25-Hydroxy: 44.2

## 2016-06-19 ENCOUNTER — Encounter: Payer: Self-pay | Admitting: Cardiology

## 2016-06-19 ENCOUNTER — Encounter (INDEPENDENT_AMBULATORY_CARE_PROVIDER_SITE_OTHER): Payer: Self-pay

## 2016-06-19 ENCOUNTER — Ambulatory Visit (INDEPENDENT_AMBULATORY_CARE_PROVIDER_SITE_OTHER): Payer: 59 | Admitting: Cardiology

## 2016-06-19 VITALS — BP 148/88 | HR 74 | Ht 63.0 in | Wt 245.4 lb

## 2016-06-19 DIAGNOSIS — I10 Essential (primary) hypertension: Secondary | ICD-10-CM | POA: Diagnosis not present

## 2016-06-19 DIAGNOSIS — E785 Hyperlipidemia, unspecified: Secondary | ICD-10-CM

## 2016-06-19 DIAGNOSIS — I444 Left anterior fascicular block: Secondary | ICD-10-CM | POA: Diagnosis not present

## 2016-06-19 NOTE — Progress Notes (Signed)
[facility]. [address], [place]  [address] Phone: [phone] Fax:  [phone]  Date:  06/19/2016   ID:  LUKE RIGSBEE, DOB 11/07/1961, MRN [id number]  PCP:  Antony Blackbird, MD   History of Present Illness: Tammy Avery is a 55 y.o. female with hypertension, obesity here for six-month followup for evaluation of previously uncontrolled hypertension.   She is very diligent about taking her blood pressures and recording them on her phone. At one point, she was having quite low blood pressures with systolic in the 99-774 range. She transiently held her Aldactone she is now back on.   On June 01, 2013, I decided to discontinue her Marisa Severin because of concomitant use with angiotensin receptor blocker. We will continue with carvedilol and other medications. She has had some issues with kidney stones.  06/08/13-she is coming in because of some increasing nausea, lightheadedness, decrease in her blood pressure, decided to stop Aldactone. She admits that some of her blood pressures at home which have been excellent as low as 142 systolic have at times been off of the Aldactone.  08/06/13 - had some lower ext. edema. Took lasix for 7 days. Better.   12/27/13 - 134/89 at home 141/88. Stressed out, mother has Scientist, research (medical). Has gas pains with milk. No HA, no dizziness. Used to get HA with BP elevated.   08/10/15 - RLQ pain. Diverticulitis. Flagyl, Cipro, sick, anti nausea. BP was haywire. Cold and hot. Mother had MI. 71. She has felt some chest discomfort, quite atypical at times, sometimes she thinks that this is gas pain. Does not seem to be exertional. Mild to moderate in intensity.  06/19/16 - Right lower lung pain, comes and goes. Hurts with deep breath. Ankylosing spondylitis. Left knee pain. She had prior right knee surgery. She is not eager to have any further operative procedures at this point. She is clearly motivated to lose weight. No chest pain, no signal in shortness of breath, no  syncope.   Wt Readings from Last 3 Encounters:  06/19/16 245 lb 6.4 oz (111.3 kg)  08/16/15 245 lb (111.1 kg)  08/10/15 245 lb 6.4 oz (111.3 kg)     Past Medical History:  Diagnosis Date  . Anemia   . Arthritis    knees, back, hands  . Chronic kidney disease    kidney stones - no surgery required  . Chronic migraine   . Congenital cataract    left eye, blind in left eye  . Connective tissue disease ([place])   . DJD (degenerative joint disease)    arthritis  . H/O hiatal hernia    no meds - no problems  . Headache(784.0)   . HLA B27 (HLA B27 positive)   . Hypercholesterolemia   . Hyperlipidemia   . Hypertension   . Hypertensive retinopathy   . Hypothyroidism   . Hypothyroidism    well controlled  . Joint pain   . Kidney stones   . Memory loss   . Obesity, morbid ([place])   . Osteoarthritis   . 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 Polyarthropathy   . Prediabetes   . Pseudotumor cerebri syndrome    on lasix  . Radiculopathy   . Seasonal allergies   . Seizures ([place])     on meds - last one 10/18/11- meds increased  . Sinusitis   . Sleep apnea    Patient does not use CPAP  . Spondylarthritis   . Transient alteration  of awareness   . Uterine fibroid   . Vitamin D deficiency     Past Surgical History:  Procedure Laterality Date  . BACK SURGERY  09/28/2007   Degenerative Disc Disease - surgery on  L3/4, L4/5  . EYE SURGERY  1975   left eye cataract surgery  . IUD REMOVAL  01/09/2012   Procedure: INTRAUTERINE DEVICE (IUD) REMOVAL;  Surgeon: Marvene Staff, MD;  Location: [facility];  Service: Gynecology;  Laterality: N/A;  . OVARIAN CYST REMOVAL  01/09/2012   Procedure: OVARIAN CYSTECTOMY;  Surgeon: Marvene Staff, MD;  Location: [facility];  Service: Gynecology;  Laterality: Right;  . right knee arthroscopic  09/24/2006  . TONSILLECTOMY    . VESICOVAGINAL FISTULA CLOSURE W/ TAH     for fibroids    Current Outpatient Prescriptions  Medication Sig Dispense  Refill  . etanercept (ENBREL) 25 MG injection Inject 50 mg into the skin once a week.    Marland Kitchen acetaminophen (TYLENOL) 500 MG tablet Take 500 mg by mouth every 6 (six) hours as needed. Patient used this medication for pain.    . Azelaic Acid (FINACEA) 15 % cream Apply topically 2 (two) times daily. After skin is thoroughly washed and patted dry, gently but thoroughly massage a thin film of azelaic acid cream into the affected area twice daily, in the morning and evening.    . Biotin (RA BIOTIN) 2.5 MG CAPS Take 1 capsule by mouth daily.    . Calcium Carbonate (CALCIUM 600 PO) Take 2 tablets by mouth daily.    . carvedilol (COREG) 25 MG tablet TAKE 1 TABLET BY MOUTH TWICE DAILY 60 tablet 0  . Clobetasol & Clobetasol Emul 0.05 & 0.05 % MISC Apply 1 application topically 2 (two) times daily as needed.     Marland Kitchen CRANBERRY PO Take 2 capsules by mouth 3 (three) times daily.     . diclofenac sodium (VOLTAREN) 1 % GEL Apply 1 application topically 4 (four) times daily as needed.     . furosemide (LASIX) 20 MG tablet Take one tablet by mouth daily  3  . furosemide (LASIX) 40 MG tablet Take 40 mg by mouth daily.    Marland Kitchen gabapentin (NEURONTIN) 300 MG capsule take one by mouth at bedtime  4  . Glucos-Chondroit-Collag-Hyal (GLUCOSAMINE CHONDROIT-COLLAGEN) CAPS Take two 2700 mg capsules once a day    . hydroxychloroquine (PLAQUENIL) 200 MG tablet Take 200 mg by mouth 2 (two) times daily.    Marland Kitchen levothyroxine (SYNTHROID, LEVOTHROID) 75 MCG tablet Take 75 mcg by mouth daily.    Marland Kitchen loratadine (CLARITIN) 10 MG tablet Take 10 mg by mouth daily.    Marland Kitchen losartan (COZAAR) 100 MG tablet Take 100 mg by mouth daily.    . Multiple Vitamin (MULTIVITAMIN) tablet Take 2 tablets by mouth daily.    . NON FORMULARY (Qnasal Nasal Aerosol - 80 mcg per spray) 2 sprays once a day    . Omega-3 Fatty Acids (FISH OIL) 1000 MG CAPS Take 3 capsules by mouth daily.     Marland Kitchen omeprazole (PRILOSEC) 40 MG capsule Take on by mouth daily  5  . potassium  chloride (K-DUR) 10 MEQ tablet Take 10 mEq by mouth 2 (two) times daily.    . pravastatin (PRAVACHOL) 40 MG tablet Take 40 mg by mouth daily.    Marland Kitchen terbinafine (LAMISIL) 250 MG tablet Take 1 tablet (250 mg total) by mouth daily. 90 tablet 0  . Vitamin D, Cholecalciferol, 1000 UNITS TABS Take  500 Units by mouth daily.     Marland Kitchen zonisamide (ZONEGRAN) 100 MG capsule Take 300 mg by mouth daily.     No current facility-administered medications for this visit.     Allergies:    Allergies  Allergen Reactions  . Advil [Ibuprofen]     Rapid heartbeat  . Aleve [Naproxen Sodium]     dizziness nausea  . Benadryl [Diphenhydramine] Hives and Other (See Comments)    fatigue  . Doxycycline Other (See Comments)    Kidney stones  . Lactose Intolerance (Gi) Diarrhea  . Limbrel [Flavocoxid]     Diarrhea    . Mobic [Meloxicam]     vomiting   . Neosporin [Neomycin-Bacitracin Zn-Polymyx]     Causes infection  . Soma [Carisoprodol] Other (See Comments)    seizures  . Codeine Rash    Patient states that Codeine in liquid form causes her rash reaction.  But other forms of codeine are okay for patient to take.  . Latex Rash  . Morphine And Related Itching and Rash  . Penicillins Rash    Social History:  The patient  reports that she has never smoked. She has never used smokeless tobacco. She reports that she does not drink alcohol or use drugs.   ROS:  Please see the history of present illness.   Denies any fevers, chills, orthopnea, PND, chest pain, shortness of breath    PHYSICAL EXAM: VS:  BP (!) 148/88   Pulse 74   Ht _0  (1.6 m)   Wt 245 lb 6.4 oz (111.3 kg)   LMP 12/13/2011   BMI 43.47 kg/m  Well nourished, well developed, in no acute distress  HEENT: normal  Neck: no JVD  Cardiac:  normal S1, S2; RRR; no murmur  Lungs:  clear to auscultation bilaterally, no wheezing, rhonchi or rales  Abd: soft, nontender, no hepatomegaly Obese Ext: no edema  Skin: warm and dry  Neuro: no focal  abnormalities noted  EKG:  Today 06/19/16 - NSR, LAFB, NSSTW changes.  08/10/15-sinus rhythm, 79, left anterior fascicular block, T-wave inversion in lead 3, subtly and aVF, V3, V4, V5, V6. J-point elevation in 1 and aVL., Poor R-wave progression personally viewed-prior Normal sinus rhythm heart rate 83 with left anterior fascicular block, nonspecific ST flattening.    NUC stress 08/17/15:   The left ventricular ejection fraction is normal (55-65%).   Nuclear stress EF: 56%.   There was no ST segment deviation noted during stress.   The study is normal.   This is a low risk study.  No evidence of ischemia or scar. Reassuring NUC stress.   LDL 79, HDL 71, creatinine 0.8     ASSESSMENT AND PLAN:  1. Atypical chest pain- nuclear stress test, 2 day study, pharmacologic reassuring 2016, no ischemia, low risk.  This symptom seems to have resolved. 2. Difficult to control hypertension-multidrug regimen. At one point on Tekturna. Overall doing well on current regimen. No changes. Mildly elevated today. She is taking measurements at home and she will be following up with Dr. Chapman Fitch seen regarding this. It was quite high at her last office visit. One additional medicines that can sometimes help significantly with difficult to control hypertension is spironolactone. Could consider adding this at 12.5 or 25 mg low dose. Watch potassium. 3. Hyperlipidemia-currently on statin therapy. Excellent. Primary prevention, strong family history. 4. Morbid obesity-continue to encourage weight loss.  5. Left anterior fascicular block-no high risk symptoms like syncope.  EKG relatively unchanged  from prior. 6. Prediabetes-weight loss. A1c 5.7. 7. I will see her back in 12 months. Prevention efforts.  Signed, Candee Furbish, MD [facility]  06/19/2016 9:11 AM

## 2016-06-19 NOTE — Patient Instructions (Signed)
Medication Instructions:  Your physician recommends that you continue on your current medications as directed. Please refer to the Current Medication list given to you today.   Labwork: NONE  Testing/Procedures: NONE  Follow-Up: Your physician wants you to follow-up in: [place] will receive a reminder letter in the mail two months in advance. If you don't receive a letter, please call our office to schedule the follow-up appointment.   Any Other Special Instructions Will Be Listed Below (If Applicable).     If you need a refill on your cardiac medications before your next appointment, please call your pharmacy.

## 2016-06-20 ENCOUNTER — Ambulatory Visit (INDEPENDENT_AMBULATORY_CARE_PROVIDER_SITE_OTHER): Payer: 59 | Admitting: Podiatry

## 2016-06-20 ENCOUNTER — Encounter: Payer: Self-pay | Admitting: Podiatry

## 2016-06-20 VITALS — BP 193/112 | HR 76 | Resp 16

## 2016-06-20 DIAGNOSIS — M722 Plantar fascial fibromatosis: Secondary | ICD-10-CM | POA: Diagnosis not present

## 2016-06-20 NOTE — Progress Notes (Signed)
This patient presents the office with chief complaint of severe pain noted in her left heel. She states her pain is a 12 out of 1-10. She states she has been unable to work for the last few days she had a similar problem 11 years ago which was diagnosed as plantar fasciitis and treated with injection therapy. She has been walking and using her cam walker at home and she presents the office today for an evaluation and treatment of her foot. She points to an area at the inside of her left heel as the point of maximum pain.  GENERAL APPEARANCE: Alert, conversant. Appropriately groomed. No acute distress.  VASCULAR: Pedal pulses are  palpable at  [facility] and PT bilateral.  Capillary refill time is immediate to all digits,  Normal temperature gradient.  Digital hair growth is present bilateral  NEUROLOGIC: sensation is normal to 5.07 monofilament at 5/5 sites bilateral.  Light touch is intact bilateral, Muscle strength normal.  MUSCULOSKELETAL: acceptable muscle strength, tone and stability bilateral.  Intrinsic muscluature intact bilateral.  Rectus appearance of foot and digits noted bilateral. Pes planus foot type.  Palpable pain at insertion plantar fascia left heel.  DERMATOLOGIC: skin color, texture, and turgor are within normal limits.  No preulcerative lesions or ulcers  are seen, no interdigital maceration noted.  No open lesions present.  Digital nails are asymptomatic. No drainage noted.  Dx.  Plantar fascitis left heel.   Tx.  ROV  injection therapy left heel. Patient was told to ambulate with cam walker. Patient was given off until this coming Monday to rest her foot return to the office in 2 weeks for evaluation and treatment.  Gardiner Barefoot DPM

## 2016-06-21 DIAGNOSIS — M722 Plantar fascial fibromatosis: Secondary | ICD-10-CM

## 2016-07-04 ENCOUNTER — Ambulatory Visit (INDEPENDENT_AMBULATORY_CARE_PROVIDER_SITE_OTHER): Payer: 59 | Admitting: Podiatry

## 2016-07-04 VITALS — BP 209/108 | HR 76 | Resp 16

## 2016-07-04 DIAGNOSIS — M722 Plantar fascial fibromatosis: Secondary | ICD-10-CM | POA: Diagnosis not present

## 2016-07-04 NOTE — Progress Notes (Signed)
This patient returns to the office 2 weeks after being seen for acute plantar fasciitis, left heel treated with injection therapy at that visit and told to use the cam walker as needed. He was also given a few days off from work to rest her foot since the office today stating that her heel is 70% improved but she did have a flareup this past Tuesday. She relates this flareup due to her job position and the shoes she wore he presents the office today. Please with her improvement for continued evaluation and treatment  GENERAL APPEARANCE: Alert, conversant. Appropriately groomed. No acute distress.  VASCULAR: Pedal pulses are  palpable at  [facility] and PT bilateral.  Capillary refill time is immediate to all digits,  Normal temperature gradient.  Digital hair growth is present bilateral  NEUROLOGIC: sensation is normal to 5.07 monofilament at 5/5 sites bilateral.  Light touch is intact bilateral, Muscle strength normal.  MUSCULOSKELETAL: acceptable muscle strength, tone and stability bilateral.  Intrinsic muscluature intact bilateral.  Pes planus foot type left foot.  Rain at insertion plantar fascia has resolved.   DERMATOLOGIC: skin color, texture, and turgor are within normal limits.  No preulcerative lesions or ulcers  are seen, no interdigital maceration noted.  No open lesions present.  Digital nails are asymptomatic. No drainage noted.  DX  Plantar fascitis left    ROV  discussed her heel pain with this patient. She states she has picked up a new pair shoes which have helped dramatically. She says that she is now able to walk and experiencing minimal pain in her left heel. He says she is going to pick up additional shoes for work. She is pleased with her progress and will return to the office when necessary   Gardiner Barefoot DPM

## 2016-07-12 ENCOUNTER — Other Ambulatory Visit (HOSPITAL_BASED_OUTPATIENT_CLINIC_OR_DEPARTMENT_OTHER): Payer: Self-pay | Admitting: Orthopaedic Surgery

## 2016-07-12 DIAGNOSIS — M25562 Pain in left knee: Secondary | ICD-10-CM

## 2016-07-15 ENCOUNTER — Ambulatory Visit (HOSPITAL_BASED_OUTPATIENT_CLINIC_OR_DEPARTMENT_OTHER)
Admission: RE | Admit: 2016-07-15 | Discharge: 2016-07-15 | Disposition: A | Discharge status: Home / Self Care | Payer: 59 | Source: Ambulatory Visit | Attending: Orthopaedic Surgery | Admitting: Orthopaedic Surgery

## 2016-07-15 DIAGNOSIS — M25562 Pain in left knee: Secondary | ICD-10-CM | POA: Insufficient documentation

## 2016-07-24 ENCOUNTER — Ambulatory Visit (INDEPENDENT_AMBULATORY_CARE_PROVIDER_SITE_OTHER): Payer: 59 | Admitting: Podiatry

## 2016-07-24 DIAGNOSIS — M722 Plantar fascial fibromatosis: Secondary | ICD-10-CM | POA: Diagnosis not present

## 2016-07-24 NOTE — Progress Notes (Signed)
This patient returns to the office 1 week after experiencing severe pain noted through the center of her left arch. She says the pain was unbearable and she was unable to walk. Since that event. She says her foot has become better and she is now experiencing only 1 out of 10 pain. She is very pleased with her improvement. She has been previously diagnosed with pes planus and plantar fasciitis, left foot. She is been treated unsuccessfully with insoles. She presents the office today for follow-up evaluation of her left foot  GENERAL APPEARANCE: Alert, conversant. Appropriately groomed. No acute distress.  VASCULAR: Pedal pulses are  palpable at  [facility] and PT bilateral.  Capillary refill time is immediate to all digits,  Normal temperature gradient.  Digital hair growth is present bilateral  NEUROLOGIC: sensation is normal to 5.07 monofilament at 5/5 sites bilateral.  Light touch is intact bilateral, Muscle strength normal.  MUSCULOSKELETAL: acceptable muscle strength, tone and stability bilateral.  Intrinsic muscluature intact bilateral.  Pes planus foot type left foot.  Pain at insertion plantar fascia has resolved. Mild discomfort noted left arch.  DERMATOLOGIC: skin color, texture, and turgor are within normal limits.  No preulcerative lesions or ulcers  are seen, no interdigital maceration noted.  No open lesions present.  Digital nails are asymptomatic. No drainage noted.  DX  Plantar fascitis left    ROV  discussed her heel pain with this patient.  Told this patient to wear plantar fascial brace on her left foot. No evidence of any redness, swelling noted in her left foot. Told her that next time she experiences severe pain and discomfort to use her cam walker which she has at home.  RTC prn   Gardiner Barefoot DPM

## 2016-10-28 ENCOUNTER — Ambulatory Visit (INDEPENDENT_AMBULATORY_CARE_PROVIDER_SITE_OTHER): Payer: 59 | Admitting: Orthopaedic Surgery

## 2016-10-28 DIAGNOSIS — G8929 Other chronic pain: Secondary | ICD-10-CM | POA: Diagnosis not present

## 2016-10-28 DIAGNOSIS — M25562 Pain in left knee: Secondary | ICD-10-CM

## 2016-10-28 MED ORDER — LIDOCAINE HCL 1 % IJ SOLN
3.0000 mL | INTRAMUSCULAR | Status: AC | PRN
  Administered 2016-10-28: 3 mL

## 2016-10-28 MED ORDER — METHYLPREDNISOLONE ACETATE 40 MG/ML IJ SUSP
40.0000 mg | INTRAMUSCULAR | Status: AC | PRN
  Administered 2016-10-28: 40 mg via INTRA_ARTICULAR

## 2016-10-28 NOTE — Progress Notes (Signed)
Office Visit Note   Patient: Tammy Avery           Date of Birth: 14-Apr-1961           MRN: [id number] Visit Date: 10/28/2016              Requested by: Antony Blackbird, MD [phone], [place] [address] PCP: Antony Blackbird, MD   Assessment & Plan: Visit Diagnoses:  1. Chronic pain of left knee     Plan: She tolerated the steroid injection well and her left knee. She is going to try Tumeric over-the-counter. I will see her back in 4 weeks to see how she is doing overall. She'll still work on weight loss and quad strengthening exercises. We may also consider a knee arthroscopy in the future. She has asked about FMLA paperwork that we could fill out for her to allow her to be off from time to time if her knee flares up on her and I think this would be reasonable.  Follow-Up Instructions: No Follow-up on file.   Orders:  No orders of the defined types were placed in this encounter.  No orders of the defined types were placed in this encounter.     Procedures: Large Joint Inj Date/Time: 10/28/2016 10:17 AM Performed by: Mcarthur Rossetti Authorized by: Mcarthur Rossetti   Indications:  Pain Location:  Knee Site:  L knee Ultrasound Guidance: No   Fluoroscopic Guidance: No   Arthrogram: No   Medications:  3 mL lidocaine 1 %; 40 mg methylPREDNISolone acetate 40 MG/ML     Clinical Data: No additional findings.   Subjective: No chief complaint on file. She is almost 3 months out from a Monovisc injection in her left knee. She is requesting a steroid injection today due to some of the pain she is having in her knee. She denies any locking catching.  HPI  Review of Systems Negative for chest pain, headache, shortness of breath, fever, chills, nausea, vomiting.  Objective: Vital Signs: LMP 12/13/2011   Physical Exam He is alert and oriented 3 in no acute distress Ortho Exam Examination of her left knee shows a mild varus deformity with painful  range of motion mainly on the medial joint line. Her Lockman's and wears her negative thus far. There is no effusion today. Specialty Comments:  No specialty comments available.  Imaging: No results found.   PMFS History: Patient Active Problem List   Diagnosis Date Noted  . Atypical chest pain 08/10/2015  . Essential hypertension 12/27/2013  . Hyperlipidemia 12/27/2013  . Morbid obesity ([place]) 12/27/2013  . Left anterior fascicular block 12/27/2013  . Prediabetes 12/27/2013   Past Medical History:  Diagnosis Date  . Anemia   . Arthritis    knees, back, hands  . Chronic kidney disease    kidney stones - no surgery required  . Chronic migraine   . Congenital cataract    left eye, blind in left eye  . Connective tissue disease ([place])   . DJD (degenerative joint disease)    arthritis  . H/O hiatal hernia    no meds - no problems  . Headache(784.0)   . HLA B27 (HLA B27 positive)   . Hypercholesterolemia   . Hyperlipidemia   . Hypertension   . Hypertensive retinopathy   . Hypothyroidism   . Hypothyroidism    well controlled  . Joint pain   . Kidney stones   . Memory loss   . Obesity,  morbid ([place])   . Osteoarthritis   . 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 Polyarthropathy   . Prediabetes   . Pseudotumor cerebri syndrome    on lasix  . Radiculopathy   . Seasonal allergies   . Seizures ([place])     on meds - last one 10/18/11- meds increased  . Sinusitis   . Sleep apnea    Patient does not use CPAP  . 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 Transient alteration of awareness   . Uterine fibroid   . Vitamin D deficiency     Family History  Problem Relation Age of Onset  . Hypertension Mother   . Heart disease Mother   . Diabetes Mother     DM  . Lupus Mother   . CAD Mother   . CAD Father   . Heart disease Father   . Hypertension Brother   . Hypertension Sister     Past Surgical History:  Procedure Laterality Date  . BACK SURGERY  09/28/2007   Degenerative Disc  Disease - surgery on  L3/4, L4/5  . EYE SURGERY  1975   left eye cataract surgery  . IUD REMOVAL  01/09/2012   Procedure: INTRAUTERINE DEVICE (IUD) REMOVAL;  Surgeon: Marvene Staff, MD;  Location: [facility];  Service: Gynecology;  Laterality: N/A;  . OVARIAN CYST REMOVAL  01/09/2012   Procedure: OVARIAN CYSTECTOMY;  Surgeon: Marvene Staff, MD;  Location: [facility];  Service: Gynecology;  Laterality: Right;  . right knee arthroscopic  09/24/2006  . TONSILLECTOMY    . VESICOVAGINAL FISTULA CLOSURE W/ TAH     for fibroids   Social History   Occupational History  . Not on file.   Social History Main Topics  . Smoking status: Never Smoker  . Smokeless tobacco: Never Used  . Alcohol use No  . Drug use: No  . Sexual activity: No

## 2016-11-28 ENCOUNTER — Telehealth (INDEPENDENT_AMBULATORY_CARE_PROVIDER_SITE_OTHER): Payer: Self-pay | Admitting: Orthopaedic Surgery

## 2016-11-28 NOTE — Telephone Encounter (Signed)
Patient called advised she dropped her FMLA papers back 11/21/16 for the doctors signature and she need them back so that she can turn them in no later than Monday. The number to contact patient is [phone]

## 2016-11-28 NOTE — Telephone Encounter (Signed)
LMOM for patient form ready at front desk

## 2016-12-09 ENCOUNTER — Ambulatory Visit (INDEPENDENT_AMBULATORY_CARE_PROVIDER_SITE_OTHER): Payer: 59 | Admitting: Orthopaedic Surgery

## 2016-12-09 DIAGNOSIS — M25562 Pain in left knee: Secondary | ICD-10-CM

## 2016-12-09 DIAGNOSIS — G8929 Other chronic pain: Secondary | ICD-10-CM | POA: Diagnosis not present

## 2016-12-09 NOTE — Progress Notes (Signed)
The patient is well-known to me. She is an obese 56 year old chronic left knee pain. She's done everything she can to work on activity modification. She says she changed jobs within her job recently the takes more stress off the knee. She is tried hyaluronic acid. We aced a steroid injection in her left knee back in December that helped while a bit. The colon was made her hurt again. She was consider another steroid injection down the road. She's not interested in surgery yet. She still denies any snapping and locking catching in her knee is mainly pain with weightbearing activities.  On examination of her left knee there is a mild varus deformity. There is no effusion today. His medial joint line tenderness with good range of motion lobe of the patellofemoral crepitation.  At this point we'll see her back in about 6 weeks to place another steroid injection in her left knee. She'll still work on activity modification and weight loss as well as quad strengthening exercises.

## 2017-01-20 ENCOUNTER — Ambulatory Visit (INDEPENDENT_AMBULATORY_CARE_PROVIDER_SITE_OTHER): Payer: 59 | Admitting: Orthopaedic Surgery

## 2017-01-20 DIAGNOSIS — G8929 Other chronic pain: Secondary | ICD-10-CM | POA: Diagnosis not present

## 2017-01-20 DIAGNOSIS — M25562 Pain in left knee: Secondary | ICD-10-CM

## 2017-01-20 MED ORDER — LIDOCAINE HCL 1 % IJ SOLN
3.0000 mL | INTRAMUSCULAR | Status: AC | PRN
  Administered 2017-01-20: 3 mL

## 2017-01-20 MED ORDER — METHYLPREDNISOLONE ACETATE 40 MG/ML IJ SUSP
40.0000 mg | INTRAMUSCULAR | Status: AC | PRN
  Administered 2017-01-20: 40 mg via INTRA_ARTICULAR

## 2017-01-20 NOTE — Progress Notes (Signed)
Office Visit Note   Patient: Tammy Avery           Date of Birth: Sep 11, 1961           MRN: [id number] Visit Date: 01/20/2017              Requested by: Antony Blackbird, MD [phone], [place] [address] PCP: Antony Blackbird, MD   Assessment & Plan: Visit Diagnoses:  1. Chronic pain of left knee     Plan: He tolerated the steroid injection well and her left knee. At this point she'll follow-up as needed. I talked her in detail about knee replacement surgery and the risk and benefits of this and also with added that she should not consider a steroid injection again for at least 3-4 months. She will let us know.  Follow-Up Instructions: Return if symptoms worsen or fail to improve.   Orders:  Orders Placed This Encounter  Procedures  . Large Joint Injection/Arthrocentesis   No orders of the defined types were placed in this encounter.     Procedures: Large Joint Inj Date/Time: 01/20/2017 9:34 AM Performed by: Mcarthur Rossetti Authorized by: Mcarthur Rossetti   Location:  Knee Site:  L knee Ultrasound Guidance: No   Fluoroscopic Guidance: No   Arthrogram: No   Medications:  3 mL lidocaine 1 %; 40 mg methylPREDNISolone acetate 40 MG/ML     Clinical Data: No additional findings.   Subjective: No chief complaint on file. The patient is well-known to me. She has a history of osteoarthritis and pain with her left knee. She has tried a hyaluronic acid injection and a steroid injection. It is helped minimally. She is requesting another steroid injection today. Is been 3 months since her last injection. She is still not interested in any type of surgery which I do feel the next surgery would need to be a knee replacement if he gets to where her knee is bothering her enough. She says right now she still working on weight loss and activity modification and she would just like to have an injection today.  HPI  Review of Systems She denies any headache,  shortness of breath, fever, chills, nausea, vomiting, chest pain  Objective: Vital Signs: LMP 12/13/2011   Physical Exam She is alert and oriented 3 and in no acute distress Ortho Exam Examination of her left knee shows a mild varus deformity. There is medial joint line tenderness and significant patellofemoral crepitation with full range of motion. The knee is ligamentously stable. It is deathly painful. Specialty Comments:  No specialty comments available.  Imaging: No results found. X-rays in the past of her left knee independently reviewed by me show moderate tricompartmental arthritis.  PMFS History: Patient Active Problem List   Diagnosis Date Noted  . Chronic pain of left knee 12/09/2016  . Atypical chest pain 08/10/2015  . Essential hypertension 12/27/2013  . Hyperlipidemia 12/27/2013  . Morbid obesity ([place]) 12/27/2013  . Left anterior fascicular block 12/27/2013  . Prediabetes 12/27/2013   Past Medical History:  Diagnosis Date  . Anemia   . Arthritis    knees, back, hands  . Chronic kidney disease    kidney stones - no surgery required  . Chronic migraine   . Congenital cataract    left eye, blind in left eye  . Connective tissue disease ([place])   . DJD (degenerative joint disease)    arthritis  . H/O hiatal hernia    no meds -  no problems  . Headache(784.0)   . HLA B27 (HLA B27 positive)   . Hypercholesterolemia   . Hyperlipidemia   . Hypertension   . Hypertensive retinopathy   . Hypothyroidism   . Hypothyroidism    well controlled  . Joint pain   . Kidney stones   . Memory loss   . Obesity, morbid ([place])   . Osteoarthritis   . 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 Polyarthropathy   . Prediabetes   . Pseudotumor cerebri syndrome    on lasix  . Radiculopathy   . Seasonal allergies   . Seizures ([place])     on meds - last one 10/18/11- meds increased  . Sinusitis   . Sleep apnea    Patient does not use CPAP  . 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  Transient alteration of awareness   . Uterine fibroid   . Vitamin D deficiency     Family History  Problem Relation Age of Onset  . Hypertension Mother   . Heart disease Mother   . Diabetes Mother     DM  . Lupus Mother   . CAD Mother   . CAD Father   . Heart disease Father   . Hypertension Brother   . Hypertension Sister     Past Surgical History:  Procedure Laterality Date  . BACK SURGERY  09/28/2007   Degenerative Disc Disease - surgery on  L3/4, L4/5  . EYE SURGERY  1975   left eye cataract surgery  . IUD REMOVAL  01/09/2012   Procedure: INTRAUTERINE DEVICE (IUD) REMOVAL;  Surgeon: Marvene Staff, MD;  Location: [facility];  Service: Gynecology;  Laterality: N/A;  . OVARIAN CYST REMOVAL  01/09/2012   Procedure: OVARIAN CYSTECTOMY;  Surgeon: Marvene Staff, MD;  Location: [facility];  Service: Gynecology;  Laterality: Right;  . right knee arthroscopic  09/24/2006  . TONSILLECTOMY    . VESICOVAGINAL FISTULA CLOSURE W/ TAH     for fibroids   Social History   Occupational History  . Not on file.   Social History Main Topics  . Smoking status: Never Smoker  . Smokeless tobacco: Never Used  . Alcohol use No  . Drug use: No  . Sexual activity: No

## 2017-04-04 LAB — BASIC METABOLIC PANEL
BUN: 13 (ref 4–21)
Creatinine: 0.9 (ref 0.5–1.1)
Glucose: 92
Potassium: 4 (ref 3.4–5.3)
Sodium: 141 (ref 137–147)

## 2017-04-04 LAB — TSH: TSH: 3.01 (ref 0.41–5.90)

## 2017-04-04 LAB — HEPATIC FUNCTION PANEL
ALT: 22 (ref 7–35)
AST: 21 (ref 13–35)
Alkaline Phosphatase: 69 (ref 25–125)
Bilirubin, Total: 0.4

## 2017-04-04 LAB — LIPID PANEL
Cholesterol: 178 (ref 0–200)
HDL: 67 (ref 35–70)
LDL Cholesterol: 85
Triglycerides: 133 (ref 40–160)

## 2017-04-23 ENCOUNTER — Ambulatory Visit: Payer: 59 | Admitting: Physician Assistant

## 2017-04-29 ENCOUNTER — Other Ambulatory Visit: Payer: Self-pay

## 2017-04-29 MED ORDER — VITAMIN D-3 125 MCG (5000 UT) PO TABS: ORAL_TABLET | ORAL | Status: AC

## 2017-04-29 MED ORDER — CETIRIZINE HCL 10 MG PO TABS: 10.0000 mg | ORAL_TABLET | Freq: Every day | ORAL | 11 refills | Status: AC

## 2017-04-29 MED ORDER — VITAMIN B6 200 MG PO TABS: ORAL_TABLET | ORAL | Status: AC

## 2017-04-29 MED ORDER — LOSARTAN POTASSIUM 100 MG PO TABS: 100.0000 mg | ORAL_TABLET | Freq: Every day | ORAL | 3 refills | Status: DC

## 2017-04-29 MED ORDER — DICLOFENAC SODIUM 1 % TD GEL: 2.0000 g | Freq: Four times a day (QID) | TRANSDERMAL | Status: AC

## 2017-04-30 ENCOUNTER — Other Ambulatory Visit: Payer: Self-pay | Admitting: Physician Assistant

## 2017-04-30 ENCOUNTER — Encounter: Payer: Self-pay | Admitting: Physician Assistant

## 2017-04-30 ENCOUNTER — Ambulatory Visit (INDEPENDENT_AMBULATORY_CARE_PROVIDER_SITE_OTHER): Payer: 59 | Admitting: Physician Assistant

## 2017-04-30 VITALS — BP 136/84 | HR 77 | Temp 98.6°F | Ht 63.5 in | Wt 234.0 lb

## 2017-04-30 DIAGNOSIS — I10 Essential (primary) hypertension: Secondary | ICD-10-CM

## 2017-04-30 DIAGNOSIS — G932 Benign intracranial hypertension: Secondary | ICD-10-CM | POA: Diagnosis not present

## 2017-04-30 DIAGNOSIS — R569 Unspecified convulsions: Secondary | ICD-10-CM | POA: Diagnosis not present

## 2017-04-30 DIAGNOSIS — E039 Hypothyroidism, unspecified: Secondary | ICD-10-CM

## 2017-04-30 DIAGNOSIS — M4690 Unspecified inflammatory spondylopathy, site unspecified: Secondary | ICD-10-CM

## 2017-04-30 DIAGNOSIS — Z1231 Encounter for screening mammogram for malignant neoplasm of breast: Secondary | ICD-10-CM

## 2017-04-30 DIAGNOSIS — M47819 Spondylosis without myelopathy or radiculopathy, site unspecified: Secondary | ICD-10-CM

## 2017-04-30 NOTE — Assessment & Plan Note (Signed)
Stable. 75 mcg synthroid daily.

## 2017-04-30 NOTE — Assessment & Plan Note (Signed)
Reviewed Mediterranean diet today. Follow-up with me in mid-July, or at patient's convenience to discuss weight loss.

## 2017-04-30 NOTE — Assessment & Plan Note (Addendum)
On Zonegran. Thinks that Soma caused her seizures. Followed by neurology.

## 2017-04-30 NOTE — Assessment & Plan Note (Signed)
Stable. Sees rheumatology, was on Enbrel but developed "vaginal ulcers and repeated infections", her doctor is considering her starting a different medication.

## 2017-04-30 NOTE — Assessment & Plan Note (Signed)
Well controlled. Followed by Dr. Marlou Porch in cardiology, last seen in August 2017, has difficult to control HTN, was on Tekturna at one point. She is currently on 25 mg Coreg and 100 mg Losartan.  Per cardiology most recent note, consider adding sprinolactone if needed. Watch potassium if added.

## 2017-04-30 NOTE — Patient Instructions (Signed)
Make an appointment for weight loss education with me at your convenience.   Mediterranean Diet A Mediterranean diet refers to food and lifestyle choices that are based on the traditions of countries located on the [employer]. This way of eating has been shown to help prevent certain conditions and improve outcomes for people who have chronic diseases, like kidney disease and heart disease. What are tips for following this plan? Lifestyle  Cook and eat meals together with your family, when possible.  Drink enough fluid to keep your urine clear or pale yellow.  Be physically active every day. This includes: ? Aerobic exercise like running or swimming. ? Leisure activities like gardening, walking, or housework.  Get 7-8 hours of sleep each night.  If recommended by your health care provider, drink red wine in moderation. This means 1 glass a day for nonpregnant women and 2 glasses a day for men. A glass of wine equals 5 oz (150 mL). Reading food labels  Check the serving size of packaged foods. For foods such as rice and pasta, the serving size refers to the amount of cooked product, not dry.  Check the total fat in packaged foods. Avoid foods that have saturated fat or trans fats.  Check the ingredients list for added sugars, such as corn syrup. Shopping  At the grocery store, buy most of your food from the areas near the walls of the store. This includes: ? Fresh fruits and vegetables (produce). ? Grains, beans, nuts, and seeds. Some of these may be available in unpackaged forms or large amounts (in bulk). ? Fresh seafood. ? Poultry and eggs. ? Low-fat dairy products.  Buy whole ingredients instead of prepackaged foods.  Buy fresh fruits and vegetables in-season from local farmers markets.  Buy frozen fruits and vegetables in resealable bags.  If you do not have access to quality fresh seafood, buy precooked frozen shrimp or canned fish, such as tuna, salmon, or  sardines.  Buy small amounts of raw or cooked vegetables, salads, or olives from the deli or salad bar at your store.  Stock your pantry so you always have certain foods on hand, such as olive oil, canned tuna, canned tomatoes, rice, pasta, and beans. Cooking  Cook foods with extra-virgin olive oil instead of using butter or other vegetable oils.  Have meat as a side dish, and have vegetables or grains as your main dish. This means having meat in small portions or adding small amounts of meat to foods like pasta or stew.  Use beans or vegetables instead of meat in common dishes like chili or lasagna.  Experiment with different cooking methods. Try roasting or broiling vegetables instead of steaming or sauteing them.  Add frozen vegetables to soups, stews, pasta, or rice.  Add nuts or seeds for added healthy fat at each meal. You can add these to yogurt, salads, or vegetable dishes.  Marinate fish or vegetables using olive oil, lemon juice, garlic, and fresh herbs. Meal planning  Plan to eat 1 vegetarian meal one day each week. Try to work up to 2 vegetarian meals, if possible.  Eat seafood 2 or more times a week.  Have healthy snacks readily available, such as: ? Vegetable sticks with hummus. ? [place] yogurt. ? Fruit and nut trail mix.  Eat balanced meals throughout the week. This includes: ? Fruit: 2-3 servings a day ? Vegetables: 4-5 servings a day ? Low-fat dairy: 2 servings a day ? Fish, poultry, or lean meat: 1 serving  a day ? Beans and legumes: 2 or more servings a week ? Nuts and seeds: 1-2 servings a day ? Whole grains: 6-8 servings a day ? Extra-virgin olive oil: 3-4 servings a day  Limit red meat and sweets to only a few servings a month What are my food choices?  Mediterranean diet ? Recommended ? Grains: Whole-grain pasta. Brown rice. Bulgar wheat. Polenta. Couscous. Whole-wheat bread. [place]. ? Vegetables: Artichokes. Beets. Broccoli. Cabbage.  Carrots. Eggplant. Green beans. Chard. Kale. Spinach. Onions. Leeks. Peas. Squash. Tomatoes. Peppers. Radishes. ? Fruits: Apples. Apricots. Avocado. Berries. Bananas. Cherries. Dates. Figs. Grapes. Lemons. Melon. Oranges. Peaches. Plums. Pomegranate. ? Meats and other protein foods: Beans. Almonds. Sunflower seeds. Pine nuts. Peanuts. [place]. Salmon. [place]all[place]. Shrimp. [place]. Tilapia. Clams. Oysters. Eggs. ? Dairy: Low-fat milk. Cheese. Greek yogurt. ? Beverages: Water. Red wine. Herbal tea. ? Fats and oils: Extra virgin olive oil. Avocado oil. Grape seed oil. ? Sweets and desserts: [place] yogurt with honey. Baked apples. Poached pears. Trail mix. ? Seasoning and other foods: Basil. Cilantro. Coriander. Cumin. Mint. Parsley. Sage. Rosemary. Tarragon. Garlic. Oregano. Thyme. Pepper. Balsalmic vinegar. Tahini. Hummus. Tomato sauce. Olives. Mushrooms. ? Limit these ? Grains: Prepackaged pasta or rice dishes. Prepackaged cereal with added sugar. ? Vegetables: Deep fried potatoes (french fries). ? Fruits: Fruit canned in syrup. ? Meats and other protein foods: Beef. Pork. Lamb. Poultry with skin. Hot dogs. [place] Salines. ? Dairy: Ice cream. Sour cream. Whole milk. ? Beverages: Juice. Sugar-sweetened soft drinks. Beer. Liquor and spirits. ? Fats and oils: Butter. Canola oil. Vegetable oil. Beef fat (tallow). Lard. ? Sweets and desserts: Cookies. Cakes. Pies. Candy. ? Seasoning and other foods: Mayonnaise. Premade sauces and marinades. ? The items listed may not be a complete list. Talk with your dietitian about what dietary choices are right for you. Summary  The Mediterranean diet includes both food and lifestyle choices.  Eat a variety of fresh fruits and vegetables, beans, nuts, seeds, and whole grains.  Limit the amount of red meat and sweets that you eat.  Talk with your health care provider about whether it is safe for you to drink red wine in moderation. This means 1 glass a day for nonpregnant women  and 2 glasses a day for men. A glass of wine equals 5 oz (150 mL). This information is not intended to replace advice given to you by your health care provider. Make sure you discuss any questions you have with your health care provider. Document Released: 06/27/2016 Document Revised: 07/30/2016 Document Reviewed: 06/27/2016 Elsevier Interactive Patient Education  [employer].

## 2017-04-30 NOTE — Assessment & Plan Note (Signed)
Managed by neurology. Managed with lasix. Currently stable.

## 2017-04-30 NOTE — Progress Notes (Signed)
Tammy Avery is a 56 y.o. female here for a new problem.  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  History of Present Illness:   Chief Complaint  Patient presents with  . Establish Care    NP    Acute Concerns: None  Chronic Issues: Ankylosing spondylosis -- sees rheumatology, was on Enbrel but developed "vaginal ulcers and repeated infections", her doctor is considering her starting a different medication, has had one back surgery already in 2008 Seizures -- managed by Dr. Joesph July (neuro), reports that she developed this from one of her medications that she takes, currently on Zonegran 100 mg Pseudotumor cerebri syndrome -- takes 40 mg lasix for this, managed by Dr. Joesph July (neuro) Hypothyroidism -- takes 75 mcg levothyroxine, states that her levels have recently been well-controlled, we are requesting records for recent lab values Hypertension -- followed by Dr. Marlou Porch in cardiology, last seen in August 2017, has difficult to control HTN, was on Tekturna at one point. She is currently on 25 mg Coreg and 100 mg Losartan.  Morbid obesity -- she is interested in weight loss and the [place] Maintenance: Immunizations -- defer Tdap to some other time Colonoscopy -- October 2016 -- normal, repeat in 20 years Mammogram -- June 2016 -- normal; she is overdue for this and is agreeable to scheduling today --> sister currently battling breast cancer Weight -- Weight: 234 lb (106.1 kg)  Mood -- no issues with anger or anxiety  Depression screen [facility] 2/9 04/30/2017  Decreased Interest 0  Down, Depressed, Hopeless 0  PHQ - 2 Score 0   Other providers/specialists: Rheumatology -- Ankylosing Spondylosis -- Dr. Trudie Reed Orthopedics -- knee pain -- Dr. Ninfa Linden Neurology -- Rondall Allegra -- seizures and pseudotumor cerebri Ophthalmology -- Dr. Shearon Balo Podiatry -- Dr. Prudence Davidson -- plantar fasciitis Cardiology -- Dr. Marlou Porch -- uncontrolled HTN Gynecology -- Dr.  Garwin Brothers Dermatology -- Dr. Girtha Rm Neurosurgeon -- Dr. Arnoldo Morale  PMHx, SurgHx, SocialHx, Medications, and Allergies were reviewed in the Visit Navigator and updated as appropriate.  Current Medications:   Current Outpatient Prescriptions:  .  acetaminophen (TYLENOL) 500 MG tablet, Take 500 mg by mouth every 6 (six) hours as needed. Patient used this medication for pain., Disp: , Rfl:  .  Biotin (RA BIOTIN) 2.5 MG CAPS, Take 1 capsule by mouth daily., Disp: , Rfl:  .  Calcium Carbonate (CALCIUM 600 PO), Take 2 tablets by mouth daily., Disp: , Rfl:  .  carvedilol (COREG) 25 MG tablet, TAKE 1 TABLET BY MOUTH TWICE DAILY, Disp: 60 tablet, Rfl: 0 .  cetirizine (ZYRTEC) 10 MG tablet, Take 1 tablet (10 mg total) by mouth daily., Disp: 30 tablet, Rfl: 11 .  Cholecalciferol (VITAMIN D-3) 5000 units TABS, Takes one tablet daily., Disp: 30 tablet, Rfl:  .  Clobetasol & Clobetasol Emul 0.05 & 0.05 % MISC, Apply 1 application topically 2 (two) times daily as needed. , Disp: , Rfl:  .  CRANBERRY PO, Take 2 capsules by mouth 3 (three) times daily. , Disp: , Rfl:  .  diclofenac sodium (VOLTAREN) 1 % GEL, Apply 2 g topically 4 (four) times daily., Disp: , Rfl:  .  furosemide (LASIX) 40 MG tablet, Take 40 mg by mouth daily., Disp: , Rfl:  .  gabapentin (NEURONTIN) 300 MG capsule, take one by mouth at bedtime, Disp: , Rfl: 4 .  hydroxychloroquine (PLAQUENIL) 200 MG tablet, Take 200 mg by mouth 2 (two) times daily., Disp: , Rfl:  .  levothyroxine (SYNTHROID, LEVOTHROID) 75 MCG tablet, Take 75 mcg by mouth daily., Disp: , Rfl:  .  losartan (COZAAR) 100 MG tablet, Take 100 mg by mouth daily., Disp: , Rfl:  .  losartan (COZAAR) 100 MG tablet, Take 1 tablet (100 mg total) by mouth daily., Disp: 90 tablet, Rfl: 3 .  Multiple Vitamin (MULTIVITAMIN) tablet, Take 2 tablets by mouth daily., Disp: , Rfl:  .  NON FORMULARY, (Qnasal Nasal Aerosol - 80 mcg per spray) 2 sprays once a day, Disp: , Rfl:  .  Omega-3 Fatty Acids  (FISH OIL) 1000 MG CAPS, Take 3 capsules by mouth daily. , Disp: , Rfl:  .  potassium chloride (K-DUR) 10 MEQ tablet, Take 10 mEq by mouth 2 (two) times daily., Disp: , Rfl:  .  pravastatin (PRAVACHOL) 40 MG tablet, Take 40 mg by mouth daily., Disp: , Rfl:  .  Pyridoxine HCl (VITAMIN B6) 200 MG TABS, Take one tablet daily., Disp: 30 tablet, Rfl:  .  TURMERIC PO, Take by mouth., Disp: , Rfl:  .  zonisamide (ZONEGRAN) 100 MG capsule, Take 300 mg by mouth daily., Disp: , Rfl:    Review of Systems:   Review of Systems  Constitutional: Negative for chills, fever, malaise/fatigue and weight loss.  HENT: Negative for hearing loss and tinnitus.   Eyes:       Blind in L eye   Gastrointestinal: Negative for heartburn, nausea and vomiting.  Neurological: Negative for dizziness and tingling.  Psychiatric/Behavioral: Negative for depression. The patient has insomnia. The patient is not nervous/anxious.     Vitals:   Vitals:   04/30/17 0855  BP: 136/84  Pulse: 77  Temp: 98.6 F (37 C)  TempSrc: Oral  SpO2: 99%  Weight: 234 lb (106.1 kg)  Height: 5' 3.5" (1.613 m)     Body mass index is 40.8 kg/m.  Physical Exam:   Physical Exam  Constitutional: She appears well-developed. She is cooperative.  Non-toxic appearance. She does not have a sickly appearance. She does not appear ill. No distress.  Cardiovascular: Normal rate, regular rhythm, S1 normal, S2 normal, normal heart sounds and normal pulses.   No LE edema  Pulmonary/Chest: Effort normal and breath sounds normal.  Neurological: She is alert.  Nursing note and vitals reviewed.    Assessment and Plan:    Problem List Items Addressed This Visit      Cardiovascular and Mediastinum   Essential hypertension    Well controlled. Followed by Dr. Marlou Porch in cardiology, last seen in August 2017, has difficult to control HTN, was on Tekturna at one point. She is currently on 25 mg Coreg and 100 mg Losartan.  Per cardiology most recent  note, consider adding sprinolactone if needed. Watch potassium if added.      Pseudotumor cerebri syndrome    Managed by neurology. Managed with lasix. Currently stable.        Endocrine   Hypothyroidism    Stable. 75 mcg synthroid daily.        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 Sees rheumatology, was on Enbrel but developed "vaginal ulcers and repeated infections", her doctor is considering her starting a different medication.        Other   Morbid obesity ([place])    Reviewed Mediterranean diet today. Follow-up with me in mid-July, or at patient's convenience to discuss weight loss.      Seizures ([place])    On Zonegran. Thinks that Soma caused her  seizures. Followed by neurology.       Other Visit Diagnoses    Encounter for screening mammogram for breast cancer    -  Primary       . Reviewed expectations re: course of current medical issues. . Discussed self-management of symptoms. . Outlined signs and symptoms indicating need for more acute intervention. . Patient verbalized understanding and all questions were answered. . See orders for this visit as documented in the electronic medical record. . Patient received an After-Visit Summary.  CMA or LPN served as scribe during this visit. History, Physical, and Plan performed by medical provider. Documentation and orders reviewed and attested to.  Inda Coke, PA-C

## 2017-05-05 ENCOUNTER — Encounter: Payer: Self-pay | Admitting: Physician Assistant

## 2017-05-05 LAB — PROTEIN, URINE, RANDOM: Protein, Ur: NEGATIVE

## 2017-05-05 LAB — T4: Thyroxine (T4): 9

## 2017-05-05 LAB — ESTIMATED GFR
EGFR (Non-African Amer.): 69
EGFR (Non-African Amer.): 81

## 2017-05-06 ENCOUNTER — Encounter: Payer: Self-pay | Admitting: Physician Assistant

## 2017-05-27 ENCOUNTER — Ambulatory Visit
Admission: RE | Admit: 2017-05-27 | Discharge: 2017-05-27 | Disposition: A | Discharge status: Home / Self Care | Payer: 59 | Source: Ambulatory Visit | Attending: Physician Assistant | Admitting: Physician Assistant

## 2017-05-27 DIAGNOSIS — Z1231 Encounter for screening mammogram for malignant neoplasm of breast: Secondary | ICD-10-CM

## 2017-06-04 ENCOUNTER — Ambulatory Visit: Payer: 59 | Admitting: Physician Assistant

## 2017-06-11 LAB — BASIC METABOLIC PANEL
BUN: 14 (ref 4–21)
Creatinine: 0.9 (ref 0.5–1.1)
Glucose: 87
Potassium: 3.6 (ref 3.4–5.3)
Sodium: 141 (ref 137–147)

## 2017-06-11 LAB — CBC AND DIFFERENTIAL
HCT: 37 (ref 36–46)
Hemoglobin: 12.9 (ref 12.0–16.0)
Neutrophils Absolute: 2
Platelets: 244 (ref 150–399)
WBC: 4.2

## 2017-06-11 LAB — HEPATIC FUNCTION PANEL
ALT: 21 (ref 7–35)
AST: 22 (ref 13–35)
Alkaline Phosphatase: 75 (ref 25–125)
Bilirubin, Total: 0.3

## 2017-10-03 ENCOUNTER — Ambulatory Visit: Payer: 59 | Admitting: Physician Assistant

## 2017-10-15 ENCOUNTER — Other Ambulatory Visit: Payer: Self-pay

## 2017-10-15 ENCOUNTER — Ambulatory Visit (INDEPENDENT_AMBULATORY_CARE_PROVIDER_SITE_OTHER): Payer: 59 | Admitting: Family Medicine

## 2017-10-15 ENCOUNTER — Encounter: Payer: Self-pay | Admitting: Family Medicine

## 2017-10-15 VITALS — BP 138/86 | HR 81 | Temp 98.2°F | Wt 227.0 lb

## 2017-10-15 DIAGNOSIS — Z1159 Encounter for screening for other viral diseases: Secondary | ICD-10-CM

## 2017-10-15 DIAGNOSIS — Z13220 Encounter for screening for lipoid disorders: Secondary | ICD-10-CM | POA: Diagnosis not present

## 2017-10-15 DIAGNOSIS — R5383 Other fatigue: Secondary | ICD-10-CM | POA: Diagnosis not present

## 2017-10-15 DIAGNOSIS — Z114 Encounter for screening for human immunodeficiency virus [HIV]: Secondary | ICD-10-CM

## 2017-10-15 DIAGNOSIS — R7303 Prediabetes: Secondary | ICD-10-CM | POA: Diagnosis not present

## 2017-10-15 DIAGNOSIS — Z23 Encounter for immunization: Secondary | ICD-10-CM

## 2017-10-15 LAB — LIPID PANEL
Cholesterol: 181 mg/dL (ref 0–200)
HDL: 55.7 mg/dL (ref >39.00)
LDL Cholesterol: 104 mg/dL — ABNORMAL HIGH (ref 0–99)
NonHDL: 125.46
Total CHOL/HDL Ratio: 3
Triglycerides: 105 mg/dL (ref 0.0–149.0)
VLDL: 21 mg/dL (ref 0.0–40.0)

## 2017-10-15 LAB — COMPREHENSIVE METABOLIC PANEL
ALT: 32 U/L (ref 0–35)
AST: 22 U/L (ref 0–37)
Albumin: 3.8 g/dL (ref 3.5–5.2)
Alkaline Phosphatase: 73 U/L (ref 39–117)
BUN: 9 mg/dL (ref 6–23)
CO2: 31 mEq/L (ref 19–32)
Calcium: 9.5 mg/dL (ref 8.4–10.5)
Chloride: 106 mEq/L (ref 96–112)
Creatinine, Ser: 0.78 mg/dL (ref 0.40–1.20)
GFR: 98.56 mL/min (ref >60.00)
Glucose, Bld: 90 mg/dL (ref 70–99)
Potassium: 3.7 mEq/L (ref 3.5–5.1)
Sodium: 143 mEq/L (ref 135–145)
Total Bilirubin: 0.4 mg/dL (ref 0.2–1.2)
Total Protein: 6.8 g/dL (ref 6.0–8.3)

## 2017-10-15 LAB — MICROALBUMIN / CREATININE URINE RATIO
Creatinine,U: 93.4 mg/dL
Microalb Creat Ratio: 0.7 mg/g (ref 0.0–30.0)
Microalb, Ur: <0.7 mg/dL (ref 0.0–1.9)

## 2017-10-15 LAB — TSH: TSH: 0.55 u[IU]/mL (ref 0.35–4.50)

## 2017-10-15 NOTE — Progress Notes (Addendum)
Tammy Avery is a 56 y.o. female is here for follow up.  History of Present Illness:   HPI:   1. Fatigue.  Patient due for lab work.  See below.  History of prediabetes, morbid obesity, hyperlipidemia.  Denies exercise or healthy food choices.   2. Hyperlipidemia  Is the patient taking medications without problems? [x]   YES  []   NO Does the patient complain of muscle aches?   []   YES  [x]    NO Trying to exercise on a regular basis? []   YES  [x]   NO Diet Compliance: noncompliant much of the time. Concerns: none. Cardiovascular ROS: no chest pain or dyspnea on exertion.   Lipids:    Component Value Date/Time   CHOL 181 10/15/2017 1148   TRIG 105.0 10/15/2017 1148   HDL 55.70 10/15/2017 1148   VLDL 21.0 10/15/2017 1148   CHOLHDL 3 10/15/2017 1148    3. Prediabetes.   Current symptoms: no polyuria or polydipsia, no chest pain, dyspnea or TIA's, no numbness, tingling or pain in extremities.   Maintaining a diabetic diet? []   YES  [x]   NO Trying to exercise on a regular basis? []   YES  [x]   NO  On ACE inhibitor or angiotensin II receptor blocker? [x]   YES  []   NO On Aspirin? []   YES  [x]   NO  Lab Results  Component Value Date   HGBA1C 5.7 03/05/2016    Lab Results  Component Value Date   MICROALBUR <0.7 10/15/2017    Lab Results  Component Value Date   CHOL 181 10/15/2017   HDL 55.70 10/15/2017   LDLCALC 104 (H) 10/15/2017   TRIG 105.0 10/15/2017   CHOLHDL 3 10/15/2017     Wt Readings from Last 3 Encounters:  10/15/17 227 lb (103 kg)  04/30/17 234 lb (106.1 kg)  06/19/16 245 lb 6.4 oz (111.3 kg)   BP Readings from Last 3 Encounters:  10/15/17 138/86  04/30/17 136/84  07/04/16 (!) 209/108   Lab Results  Component Value Date   CREATININE 0.78 10/15/2017     There are no preventive care reminders to display for this patient. Depression screen PHQ 2/9 04/30/2017  Decreased Interest 0  Down, Depressed, Hopeless 0  PHQ - 2 Score 0   PMHx, SurgHx,  SocialHx, FamHx, Medications, and Allergies were reviewed in the Visit Navigator and updated as appropriate.   Patient Active Problem List   Diagnosis Date Noted  . 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 Seizures ([place]) 04/30/2017  . Pseudotumor cerebri syndrome 04/30/2017  . Hypothyroidism 04/30/2017  . Chronic pain of left knee 12/09/2016  . Atypical chest pain 08/10/2015  . Essential hypertension 12/27/2013  . Hyperlipidemia 12/27/2013  . Morbid obesity ([place]) 12/27/2013  . Left anterior fascicular block 12/27/2013  . Prediabetes 12/27/2013   Social History   Tobacco Use  . Smoking status: Never Smoker  . Smokeless tobacco: Never Used  Substance Use Topics  . Alcohol use: No  . Drug use: No   Current Medications and Allergies:   Current Outpatient Medications:  .  acetaminophen (TYLENOL) 500 MG tablet, Take 500 mg by mouth every 6 (six) hours as needed. Patient used this medication for pain., Disp: , Rfl:  .  Biotin (RA BIOTIN) 2.5 MG CAPS, Take 1 capsule by mouth daily., Disp: , Rfl:  .  Calcium Carbonate (CALCIUM 600 PO), Take 2 tablets by mouth daily., Disp: , Rfl:  .  carvedilol (COREG) 25 MG tablet, TAKE 1 TABLET  BY MOUTH TWICE DAILY, Disp: 60 tablet, Rfl: 0 .  cetirizine (ZYRTEC) 10 MG tablet, Take 1 tablet (10 mg total) by mouth daily., Disp: 30 tablet, Rfl: 11 .  Cholecalciferol (VITAMIN D-3) 5000 units TABS, Takes one tablet daily., Disp: 30 tablet, Rfl:  .  Clobetasol & Clobetasol Emul 0.05 & 0.05 % MISC, Apply 1 application topically 2 (two) times daily as needed. , Disp: , Rfl:  .  CRANBERRY PO, Take 2 capsules by mouth 3 (three) times daily. , Disp: , Rfl:  .  diclofenac sodium (VOLTAREN) 1 % GEL, Apply 2 g topically 4 (four) times daily., Disp: , Rfl:  .  gabapentin (NEURONTIN) 300 MG capsule, take one by mouth at bedtime, Disp: , Rfl: 4 .  hydroxychloroquine (PLAQUENIL) 200 MG tablet, Take 200 mg by mouth 2 (two) times daily., Disp: , Rfl:  .  levothyroxine (SYNTHROID,  LEVOTHROID) 75 MCG tablet, Take 75 mcg by mouth daily., Disp: , Rfl:  .  losartan (COZAAR) 100 MG tablet, Take 100 mg by mouth daily., Disp: , Rfl:  .  losartan (COZAAR) 100 MG tablet, Take 1 tablet (100 mg total) by mouth daily., Disp: 90 tablet, Rfl: 3 .  Multiple Vitamin (MULTIVITAMIN) tablet, Take 2 tablets by mouth daily., Disp: , Rfl:  .  NON FORMULARY, (Qnasal Nasal Aerosol - 80 mcg per spray) 2 sprays once a day, Disp: , Rfl:  .  Omega-3 Fatty Acids (FISH OIL) 1000 MG CAPS, Take 3 capsules by mouth daily. , Disp: , Rfl:  .  potassium chloride (K-DUR) 10 MEQ tablet, Take 10 mEq by mouth 2 (two) times daily., Disp: , Rfl:  .  pravastatin (PRAVACHOL) 40 MG tablet, Take 40 mg by mouth daily., Disp: , Rfl:  .  Pyridoxine HCl (VITAMIN B6) 200 MG TABS, Take one tablet daily., Disp: 30 tablet, Rfl:  .  zonisamide (ZONEGRAN) 100 MG capsule, Take 300 mg by mouth daily., Disp: , Rfl:  .  furosemide (LASIX) 40 MG tablet, Take 1 tablet (40 mg total) by mouth daily., Disp: 30 tablet, Rfl: 2   Allergies  Allergen Reactions  . Advil [Ibuprofen]     Rapid heartbeat  . Aleve [Naproxen Sodium]     dizziness nausea  . Benadryl [Diphenhydramine] Hives and Other (See Comments)    fatigue  . Doxycycline Other (See Comments)    Kidney stones  . Lactose Intolerance (Gi) Diarrhea  . Limbrel [Flavocoxid]     Diarrhea    . Mobic [Meloxicam]     vomiting   . Neosporin [Neomycin-Bacitracin Zn-Polymyx]     Causes infection  . Soma [Carisoprodol] Other (See Comments)    seizures  . Codeine Rash    Patient states that Codeine in liquid form causes her rash reaction.  But other forms of codeine are okay for patient to take.  . Latex Rash  . Morphine And Related Itching and Rash  . Penicillins Rash   Review of Systems   Pertinent items are noted in the HPI. Otherwise, ROS is negative.  Vitals:   Vitals:   10/15/17 1111  BP: 138/86  Pulse: 81  Temp: 98.2 F (36.8 C)  TempSrc: Oral  SpO2:  98%  Weight: 227 lb (103 kg)     Body mass index is 39.58 kg/m.   Physical Exam:   Physical Exam  Constitutional: She is oriented to person, place, and time. She appears well-developed and well-nourished. No distress.  HENT:  Head: Normocephalic and atraumatic.  Right Ear:  External ear normal.  Left Ear: External ear normal.  Nose: Nose normal.  Mouth/Throat: Oropharynx is clear and moist.  Eyes: Conjunctivae and EOM are normal. Pupils are equal, round, and reactive to light.  Neck: Normal range of motion. Neck supple. No thyromegaly present.  Cardiovascular: Normal rate, regular rhythm, normal heart sounds and intact distal pulses.  Pulmonary/Chest: Effort normal and breath sounds normal.  Abdominal: Soft. Bowel sounds are normal.  Musculoskeletal: Normal range of motion.  Lymphadenopathy:    She has no cervical adenopathy.  Neurological: She is alert and oriented to person, place, and time.  Skin: Skin is warm and dry. Capillary refill takes less than 2 seconds.  Psychiatric: She has a normal mood and affect. Her behavior is normal.  Nursing note and vitals reviewed.   Results for orders placed or performed in visit on 10/15/17  Lipid panel  Result Value Ref Range   Cholesterol 181 0 - 200 mg/dL   Triglycerides 105.0 0.0 - 149.0 mg/dL   HDL 55.70 >39.00 mg/dL   VLDL 21.0 0.0 - 40.0 mg/dL   LDL Cholesterol 104 (H) 0 - 99 mg/dL   Total CHOL/HDL Ratio 3    NonHDL 125.46   Comprehensive metabolic panel  Result Value Ref Range   Sodium 143 135 - 145 mEq/L   Potassium 3.7 3.5 - 5.1 mEq/L   Chloride 106 96 - 112 mEq/L   CO2 31 19 - 32 mEq/L   Glucose, Bld 90 70 - 99 mg/dL   BUN 9 6 - 23 mg/dL   Creatinine, Ser 0.78 0.40 - 1.20 mg/dL   Total Bilirubin 0.4 0.2 - 1.2 mg/dL   Alkaline Phosphatase 73 39 - 117 U/L   AST 22 0 - 37 U/L   ALT 32 0 - 35 U/L   Total Protein 6.8 6.0 - 8.3 g/dL   Albumin 3.8 3.5 - 5.2 g/dL   Calcium 9.5 8.4 - 10.5 mg/dL   GFR 98.56 >60.00 mL/min    TSH  Result Value Ref Range   TSH 0.55 0.35 - 4.50 uIU/mL  Microalbumin / creatinine urine ratio  Result Value Ref Range   Microalb, Ur <0.7 0.0 - 1.9 mg/dL   Creatinine,U 93.4 mg/dL   Microalb Creat Ratio 0.7 0.0 - 30.0 mg/g  HIV antibody  Result Value Ref Range   HIV 1&2 Ab, 4th Generation NON-REACTIVE NON-REACTI  Hepatitis C antibody  Result Value Ref Range   Hepatitis C Ab NON-REACTIVE NON-REACTI   SIGNAL TO CUT-OFF 0.01 <1.00   Assessment and Plan:   Dezyre was seen today for follow-up.  Diagnoses and all orders for this visit:  Fatigue, unspecified type -     Comprehensive metabolic panel -     TSH  Lipid screening -     Lipid panel  Encounter for hepatitis C screening test for low risk patient -     Hepatitis C antibody  Encounter for screening for HIV -     HIV antibody  Prediabetes -     Microalbumin / creatinine urine ratio  Need for immunization against influenza -     Flu Vaccine QUAD 36+ mos IM  Morbid obesity ([place]) Comments: The patient is asked to make an attempt to improve diet and exercise patterns to aid in medical management of this problem.   Other orders -     Tdap vaccine greater than or equal to 7yo IM   . Reviewed expectations re: course of current medical issues. . Discussed self-management  of symptoms. . Outlined signs and symptoms indicating need for more acute intervention. . Patient verbalized understanding and all questions were answered. Marland Kitchen Health Maintenance issues including appropriate healthy diet, exercise, and smoking avoidance were discussed with patient. . See orders for this visit as documented in the electronic medical record. . Patient received an After Visit Summary.  Briscoe Deutscher, DO [place] 10/19/2017  Future Appointments  Date Time Provider [place]  04/14/2018  1:00 PM Briscoe Deutscher, DO [facility]

## 2017-10-16 ENCOUNTER — Other Ambulatory Visit: Payer: Self-pay

## 2017-10-16 ENCOUNTER — Telehealth: Payer: Self-pay | Admitting: Physician Assistant

## 2017-10-16 LAB — HIV ANTIBODY (ROUTINE TESTING W REFLEX): HIV 1&2 Ab, 4th Generation: NONREACTIVE

## 2017-10-16 LAB — HEPATITIS C ANTIBODY
Hepatitis C Ab: NONREACTIVE
SIGNAL TO CUT-OFF: 0.01 (ref <1.00)

## 2017-10-16 MED ORDER — FUROSEMIDE 40 MG PO TABS: 40.0000 mg | ORAL_TABLET | Freq: Every day | ORAL | 2 refills | Status: DC

## 2017-10-16 NOTE — Telephone Encounter (Signed)
Refill sent to patients pharmacy. 

## 2017-10-16 NOTE — Telephone Encounter (Signed)
Copied from [place]. Topic: General - Other >> Oct 16, 2017 12:02 PM Oneta Rack wrote: Relation to pt: self  Call back number:[phone] Pharmacy: [facility], [place] [phone] ([phone] (Fax)   Reason for call:  Patient requesting furosemide (LASIX) 40 MG tablet

## 2017-10-16 NOTE — Telephone Encounter (Signed)
Pt  Requesting  A  Refill  Of lasix    It  Is  Showing  Up  As  A  Historical  Med

## 2017-11-07 ENCOUNTER — Telehealth: Payer: Self-pay

## 2017-11-07 NOTE — Telephone Encounter (Signed)
Spoke with the patient and she stated that she takes 60 MG daily of the lasix instead of 40 MG. Is it ok to send in new prescription. She stated that she has been on this dose since she was at [facility].

## 2017-11-07 NOTE — Telephone Encounter (Signed)
Copied from [place] [id number]. Topic: General - Other >> Nov 07, 2017 11:15 AM Lolita Rieger, RMA wrote: Pt called and stated that she just noticed that her lasix 40 mg was written wrong pt stated that she should be taking 1 and a half tab daily  Please call pt and advise [phone]

## 2017-11-08 NOTE — Telephone Encounter (Signed)
Okay to send 

## 2017-11-10 MED ORDER — FUROSEMIDE 40 MG PO TABS: ORAL_TABLET | ORAL | 2 refills | Status: DC

## 2017-11-10 NOTE — Telephone Encounter (Signed)
New prescription sent to the pharmacy.

## 2018-02-04 ENCOUNTER — Encounter: Payer: Self-pay | Admitting: Physician Assistant

## 2018-02-04 LAB — BASIC METABOLIC PANEL
BUN: 13 (ref 4–21)
Creatinine: 0.8 (ref 0.5–1.1)
Glucose: 86
Potassium: 4.1 (ref 3.4–5.3)
Sodium: 144 (ref 137–147)

## 2018-02-04 LAB — CBC AND DIFFERENTIAL
HCT: 42 (ref 36–46)
Hemoglobin: 13.7 (ref 12.0–16.0)
Neutrophils Absolute: 2
Platelets: 233 (ref 150–399)
WBC: 4.3

## 2018-02-04 LAB — HEPATIC FUNCTION PANEL
ALT: 26 (ref 7–35)
AST: 23 (ref 13–35)
Alkaline Phosphatase: 80 (ref 25–125)
Bilirubin, Total: 0.2

## 2018-02-23 ENCOUNTER — Other Ambulatory Visit: Payer: Self-pay | Admitting: Physician Assistant

## 2018-02-23 NOTE — Telephone Encounter (Signed)
To you 

## 2018-02-23 NOTE — Telephone Encounter (Signed)
See note

## 2018-02-23 NOTE — Telephone Encounter (Signed)
Copied from [place]. Topic: Quick Communication - Rx Refill/Question >> Feb 23, 2018 10:57 AM Yvette Rack wrote: Medication: furosemide (LASIX) 40 MG tablet Has the patient contacted their pharmacy? Yes pharmacy reached out to practice twice last week (Agent: If no, request that the patient contact the pharmacy for the refill.) Preferred Pharmacy (with phone number or street name):   [facility] - [place], [address] [place] [phone] (Phone) [phone] (Fax)     Agent: Please be advised that RX refills may take up to 3 business days. We ask that you follow-up with your pharmacy.

## 2018-02-23 NOTE — Telephone Encounter (Signed)
LOV: 10/15/17  Tammy Avery  [facility]       [address]

## 2018-02-24 MED ORDER — FUROSEMIDE 40 MG PO TABS: ORAL_TABLET | ORAL | 2 refills | Status: DC

## 2018-02-24 NOTE — Telephone Encounter (Signed)
Will refill x 3 months, needs OV for further refills.

## 2018-04-07 ENCOUNTER — Telehealth: Payer: Self-pay

## 2018-04-07 NOTE — Telephone Encounter (Signed)
   [facility] Pre-operative Risk Assessment    Request for surgical clearance:  1. What type of surgery is being performed? Multiple Teeth Extracted   2. When is this surgery scheduled? TBD   3. What type of clearance is required (medical clearance vs. Pharmacy clearance to hold med vs. Both)? medical  4. Are there any medications that need to be held prior to surgery and how long? unknown   5. Practice name and name of physician performing surgery? [facility]/ Dr Natividad Brood   6. What is your office phone [phone]    7.   What is your office fax [phone]  8.   Anesthesia type (None, local, MAC, general) ? MAC   Tammy Avery 04/07/2018, 12:30 PM  _________________________________________________________________   (provider comments below)

## 2018-04-08 NOTE — Telephone Encounter (Signed)
   Primary Cardiologist:Mark Marlou Porch, MD (Last seen 06/2016)  Chart reviewed as part of pre-operative protocol coverage. Because of Tammy Avery's past medical history and time since last visit, he/she will require a follow-up visit in order to better assess preoperative cardiovascular risk.  Pre-op covering staff: - Please schedule appointment and call patient to inform them. - Please contact requesting surgeon's office via preferred method (i.e, phone, fax) to inform them of need for appointment prior to surgery.  [place], [place]  04/08/2018, 4:08 PM

## 2018-04-09 NOTE — Telephone Encounter (Signed)
Spoke with pt and advised that she would need to be seen in the office for further evaluation for surgical clearance. Pt verbalized understanding. Appointment made for 05/13/18 at 145 pm with Richardson Dopp, PA.  Also spoke with Claiborne Billings from [facility] and informed of current status as well as provided scheduled appointment date.

## 2018-04-14 ENCOUNTER — Ambulatory Visit: Payer: 59 | Admitting: Family Medicine

## 2018-04-14 ENCOUNTER — Encounter: Payer: Self-pay | Admitting: Physician Assistant

## 2018-04-14 ENCOUNTER — Ambulatory Visit (INDEPENDENT_AMBULATORY_CARE_PROVIDER_SITE_OTHER): Payer: 59 | Admitting: Physician Assistant

## 2018-04-14 VITALS — BP 140/118 | HR 85 | Temp 98.4°F | Ht 63.25 in | Wt 234.2 lb

## 2018-04-14 DIAGNOSIS — I10 Essential (primary) hypertension: Secondary | ICD-10-CM | POA: Diagnosis not present

## 2018-04-14 DIAGNOSIS — E039 Hypothyroidism, unspecified: Secondary | ICD-10-CM

## 2018-04-14 DIAGNOSIS — G4733 Obstructive sleep apnea (adult) (pediatric): Secondary | ICD-10-CM | POA: Diagnosis not present

## 2018-04-14 DIAGNOSIS — R7303 Prediabetes: Secondary | ICD-10-CM | POA: Diagnosis not present

## 2018-04-14 LAB — TSH: TSH: 2.06 u[IU]/mL (ref 0.35–4.50)

## 2018-04-14 LAB — HEMOGLOBIN A1C: Hgb A1c MFr Bld: 5.6 % (ref 4.6–6.5)

## 2018-04-14 NOTE — Progress Notes (Signed)
Tammy Avery is a 57 y.o. female is here to discuss:  I acted as a Education administrator for [employer], PA-C Anselmo Pickler, LPN  History of Present Illness:   Chief Complaint  Patient presents with  . Hypertension    follow up    Hypertension  This is a chronic (Pt is here to follow up on blood pressure was elevated at the Dentist office on May 8th . Pt has checked blood pressure at home and is running systolic around 852 and dystolic 77-82) problem. The current episode started more than 1 year ago. The problem is uncontrolled. Associated symptoms include headaches and malaise/fatigue. Pertinent negatives include no blurred vision, chest pain, neck pain, palpitations, peripheral edema or shortness of breath. Agents associated with hypertension include thyroid hormones. Risk factors for coronary artery disease include obesity, dyslipidemia and post-menopausal state. Past treatments include diuretics. There are no compliance problems.    She is currently on Coreg 25 mg, Lasix 60 mg, Cozaar 100 mg. Dentist measured her blood pressure at beginning of May and it was 210/170. She states that she really dislikes the dentist and has significant anxiety when there -- is currently on her "7th dentist." Does have sleep apnea. Hasn't had a sleep study in >10 years. Doesn't wear her CPAP as she feels as though "air is coming out" when she uses it. She has been seen by Dr. Marlou Porch in the past (Sep 2016 most recent OV) for uncontrolled HTN. Was on [place] on one point.  Wt Readings from Last 5 Encounters:  04/14/18 234 lb 3.2 oz (106.2 kg)  10/15/17 227 lb (103 kg)  04/30/17 234 lb (106.1 kg)  06/19/16 245 lb 6.4 oz (111.3 kg)  08/16/15 245 lb (111.1 kg)   Pre-Diabetes Last HgbA1c was 5.7 in April 2017. She has been trying to follow a Mediterranean diet. Weight is overall about the same over time.  Hypothyroidism Currently on 75 mcg levothyroxine. Takes medication daily, on empty stomach. Denies any  worsening of hypothyroid symptoms such as constipation, fatigue.  There are no preventive care reminders to display for this patient.  Past Medical History:  Diagnosis Date  . Anemia   . C. difficile diarrhea    2015 -- caught from mother  . Carpal tunnel syndrome   . Chronic migraine   . Congenital cataract    left eye, blind in left eye  . Connective tissue disease ([place])   . Diverticulitis    distal colon -- confrimed on CT on 01/16/16 and colonoscopy on 08/29/15  . H/O hiatal hernia    no meds - no problems  . Headache(784.0)    cannot tolerate many NSAIDs, would do injection of depomedrol in office  . HLA B27 (HLA B27 positive)   . Hyperlipidemia   . Hypertensive retinopathy   . Hypothyroidism    well controlled  . Kidney stones   . Memory loss    associated with peudotumor cerebri  . Obesity, morbid ([place])   . Osteoarthritis   . 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 Prediabetes   . Pseudotumor cerebri syndrome    on lasix  . Radiculopathy   . Seasonal allergies   . Seizures ([place])     on meds - last one 10/18/11- meds increased -- thinks of soma  . Sinusitis   . Sleep apnea    Does use CPAP  . Spondylarthritis   . Transient alteration of awareness   . Uterine fibroid   . Vitamin D  deficiency      Social History   Socioeconomic History  . Marital status: Single    Spouse name: Not on file  . Number of children: Not on file  . Years of education: Not on file  . Highest education level: Not on file  Occupational History  . Not on file  Social Needs  . Financial resource strain: Not on file  . Food insecurity:    Worry: Not on file    Inability: Not on file  . Transportation needs:    Medical: Not on file    Non-medical: Not on file  Tobacco Use  . Smoking status: Never Smoker  . Smokeless tobacco: Never Used  Substance and Sexual Activity  . Alcohol use: No  . Drug use: No  . Sexual activity: Never    Birth control/protection: None  Lifestyle    . Physical activity:    Days per week: Not on file    Minutes per session: Not on file  . Stress: Not on file  Relationships  . Social connections:    Talks on phone: Not on file    Gets together: Not on file    Attends religious service: Not on file    Active member of club or organization: Not on file    Attends meetings of clubs or organizations: Not on file    Relationship status: Not on file  . Intimate partner violence:    Fear of current or ex partner: Not on file    Emotionally abused: Not on file    Physically abused: Not on file    Forced sexual activity: Not on file  Other Topics Concern  . Not on file  Social History Narrative   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[place]   Single   No children   Fun: fantasy football ([place], [place], Engineer, manufacturing systems)    Past Surgical History:  Procedure Laterality Date  . BACK SURGERY  09/28/2007   Degenerative Disc Disease - surgery on  L3/4, L4/5  . EYE SURGERY  1975   left eye cataract surgery  . IUD REMOVAL  01/09/2012   Procedure: INTRAUTERINE DEVICE (IUD) REMOVAL;  Surgeon: Marvene Staff, MD;  Location: [facility];  Service: Gynecology;  Laterality: N/A;  . OVARIAN CYST REMOVAL  01/09/2012   Procedure: OVARIAN CYSTECTOMY;  Surgeon: Marvene Staff, MD;  Location: [facility];  Service: Gynecology;  Laterality: Right;  . right knee arthroscopic  09/24/2006  . TONSILLECTOMY    . VESICOVAGINAL FISTULA CLOSURE W/ TAH     for fibroids    Family History  Problem Relation Age of Onset  . Hypertension Mother   . Heart disease Mother   . Diabetes Mother   . Lupus Mother   . Dementia Mother   . CAD Father   . Heart disease Father   . Hypertension Brother   . Diabetes Brother   . Hypertension Sister   . Breast cancer Sister   . Heart disease Maternal Grandmother   . CVA Maternal Grandmother   . Colon cancer Neg Hx     PMHx, SurgHx, SocialHx, FamHx, Medications, and Allergies were reviewed in the Visit Navigator and updated as appropriate.    Patient Active Problem List   Diagnosis Date Noted  . 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 Seizures ([place]) 04/30/2017  . Pseudotumor cerebri syndrome 04/30/2017  . Hypothyroidism 04/30/2017  . Chronic pain of left knee 12/09/2016  . Atypical chest pain 08/10/2015  . Essential hypertension 12/27/2013  . Hyperlipidemia 12/27/2013  .  Morbid obesity ([place]) 12/27/2013  . Left anterior fascicular block 12/27/2013  . Prediabetes 12/27/2013    Social History   Tobacco Use  . Smoking status: Never Smoker  . Smokeless tobacco: Never Used  Substance Use Topics  . Alcohol use: No  . Drug use: No    Current Medications and Allergies:    Current Outpatient Medications:  .  acetaminophen (TYLENOL) 500 MG tablet, Take 500 mg by mouth every 6 (six) hours as needed. Patient used this medication for pain., Disp: , Rfl:  .  Biotin (RA BIOTIN) 2.5 MG CAPS, Take 1 capsule by mouth daily., Disp: , Rfl:  .  Calcium Carbonate (CALCIUM 600 PO), Take 2 tablets by mouth daily., Disp: , Rfl:  .  carvedilol (COREG) 25 MG tablet, TAKE 1 TABLET BY MOUTH TWICE DAILY, Disp: 60 tablet, Rfl: 0 .  cetirizine (ZYRTEC) 10 MG tablet, Take 1 tablet (10 mg total) by mouth daily., Disp: 30 tablet, Rfl: 11 .  Cholecalciferol (VITAMIN D-3) 5000 units TABS, Takes one tablet daily., Disp: 30 tablet, Rfl:  .  Clobetasol & Clobetasol Emul 0.05 & 0.05 % MISC, Apply 1 application topically 2 (two) times daily as needed. , Disp: , Rfl:  .  CRANBERRY PO, Take 2 capsules by mouth 3 (three) times daily. , Disp: , Rfl:  .  diclofenac sodium (VOLTAREN) 1 % GEL, Apply 2 g topically 4 (four) times daily., Disp: , Rfl:  .  furosemide (LASIX) 40 MG tablet, Take 1.5 tablets daily., Disp: 45 tablet, Rfl: 2 .  gabapentin (NEURONTIN) 300 MG capsule, take one by mouth at bedtime, Disp: , Rfl: 4 .  hydroxychloroquine (PLAQUENIL) 200 MG tablet, Take 200 mg by mouth 2 (two) times daily., Disp: , Rfl:  .  levothyroxine (SYNTHROID, LEVOTHROID) 75  MCG tablet, Take 75 mcg by mouth daily., Disp: , Rfl:  .  losartan (COZAAR) 100 MG tablet, Take 1 tablet (100 mg total) by mouth daily., Disp: 90 tablet, Rfl: 3 .  Multiple Vitamin (MULTIVITAMIN) tablet, Take 2 tablets by mouth daily., Disp: , Rfl:  .  Omega-3 Fatty Acids (FISH OIL) 1000 MG CAPS, Take 3 capsules by mouth daily. , Disp: , Rfl:  .  potassium chloride (K-DUR) 10 MEQ tablet, Take 10 mEq by mouth 2 (two) times daily., Disp: , Rfl:  .  pravastatin (PRAVACHOL) 40 MG tablet, Take 40 mg by mouth daily., Disp: , Rfl:  .  Pyridoxine HCl (VITAMIN B6) 200 MG TABS, Take one tablet daily., Disp: 30 tablet, Rfl:  .  zonisamide (ZONEGRAN) 100 MG capsule, Take 300 mg by mouth daily., Disp: , Rfl:    Allergies  Allergen Reactions  . Advil [Ibuprofen]     Rapid heartbeat  . Aleve [Naproxen Sodium]     dizziness nausea  . Benadryl [Diphenhydramine] Hives and Other (See Comments)    fatigue  . Doxycycline Other (See Comments)    Kidney stones  . Lactose Intolerance (Gi) Diarrhea  . Limbrel [Flavocoxid]     Diarrhea    . Mobic [Meloxicam]     vomiting   . Neosporin [Neomycin-Bacitracin Zn-Polymyx]     Causes infection  . Soma [Carisoprodol] Other (See Comments)    seizures  . Codeine Rash    Patient states that Codeine in liquid form causes her rash reaction.  But other forms of codeine are okay for patient to take.  . Latex Rash  . Morphine And Related Itching and Rash  . Penicillins Rash  Review of Systems   Review of Systems  Constitutional: Positive for malaise/fatigue.  Eyes: Negative for blurred vision.  Respiratory: Negative for shortness of breath.   Cardiovascular: Negative for chest pain and palpitations.  Musculoskeletal: Negative for neck pain.  Neurological: Positive for headaches.    Vitals:   Vitals:   04/14/18 1313 04/14/18 1401  BP: (!) 148/104 (!) 140/118  Pulse: 87 85  Temp: 98.4 F (36.9 C)   TempSrc: Oral   SpO2: 97% 97%  Weight: 234 lb 3.2  oz (106.2 kg)   Height: 5' 3.25" (1.607 m)      Body mass index is 41.16 kg/m.   Physical Exam:    Physical Exam  Constitutional: She appears well-developed. She is cooperative.  Non-toxic appearance. She does not have a sickly appearance. She does not appear ill. No distress.  Cardiovascular: Normal rate, regular rhythm, S1 normal, S2 normal, normal heart sounds and normal pulses.  No LE edema  Pulmonary/Chest: Effort normal and breath sounds normal.  Neurological: She is alert. GCS eye subscore is 4. GCS verbal subscore is 5. GCS motor subscore is 6.  Skin: Skin is warm, dry and intact.  Psychiatric: She has a normal mood and affect. Her speech is normal and behavior is normal.  Nursing note and vitals reviewed.    Assessment and Plan:    Markitta was seen today for hypertension.  Diagnoses and all orders for this visit:  Hypothyroidism, unspecified type Continue current regimen, will re-check TSH today. -     TSH  Morbid obesity ([place]) and Prediabetes Update HgbA1c. Continue Mediterranean diet. -     Hemoglobin A1c  OSA (obstructive sleep apnea) Needs updated sleep study, will put in referral. If she is able to get this done through her current neurologist, she will let us know and we will discontinue our referral.  HTN Suspect uncontrolled OSA and weight gain are contributing. She has an appointment with cardiology next month, and per chart review has historically difficult-to-treat elevated blood pressures. Will defer to them. I also asked her to discuss with her neurologist that she is seeing on Monday of next week to see if they had any additional input.  . Reviewed expectations re: course of current medical issues. . Discussed self-management of symptoms. . Outlined signs and symptoms indicating need for more acute intervention. . Patient verbalized understanding and all questions were answered. . See orders for this visit as documented in the electronic medical  record. . Patient received an After Visit Summary.  CMA or LPN served as scribe during this visit. History, Physical, and Plan performed by medical provider. Documentation and orders reviewed and attested to.  Inda Coke, PA-C [place], [place] 04/14/2018  Follow-up: No follow-ups on file.

## 2018-04-14 NOTE — Patient Instructions (Addendum)
You will be contacted about your referral for a sleep study -- if your neurologist can arrange this, please let us know.  We will contact you with your lab results.  Please see the cardiologist and neurologist as scheduled.  Follow-up with [place] at your convenience for a physical -- we will need to complete fasting blood work at this time.

## 2018-04-28 ENCOUNTER — Encounter: Payer: Self-pay | Admitting: Physician Assistant

## 2018-04-28 LAB — ESTIMATED GFR
EGFR (Non-African Amer.): 74
EGFR (Non-African Amer.): 81

## 2018-04-28 LAB — C-REACTIVE PROTEIN, QUANT: CRP: 13.4

## 2018-05-10 ENCOUNTER — Other Ambulatory Visit: Payer: Self-pay | Admitting: Physician Assistant

## 2018-05-12 ENCOUNTER — Other Ambulatory Visit: Payer: Self-pay | Admitting: Physician Assistant

## 2018-05-12 ENCOUNTER — Encounter: Payer: Self-pay | Admitting: *Deleted

## 2018-05-12 ENCOUNTER — Telehealth: Payer: Self-pay | Admitting: Physician Assistant

## 2018-05-12 MED ORDER — LOSARTAN POTASSIUM 100 MG PO TABS: 100.0000 mg | ORAL_TABLET | Freq: Every day | ORAL | 0 refills | Status: DC

## 2018-05-12 MED ORDER — LEVOTHYROXINE SODIUM 75 MCG PO TABS: 75.0000 ug | ORAL_TABLET | Freq: Every day | ORAL | 0 refills | Status: DC

## 2018-05-12 MED ORDER — PRAVASTATIN SODIUM 40 MG PO TABS: 40.0000 mg | ORAL_TABLET | Freq: Every day | ORAL | 0 refills | Status: DC

## 2018-05-12 NOTE — Telephone Encounter (Signed)
Please call patient and let her know that I have refilled her synthroid x 3 months. I have refilled losartan and pravastatin x 1 month. She has an appt with cardiology next month, I recommend that she attend that appointment. Additionally, I would like for her to see me in 1 month for follow-up so we can check cholesterol and address any other outstanding issues.  Inda Coke PA-C 05/12/18

## 2018-05-12 NOTE — Telephone Encounter (Signed)
Please see message and advise if refills are okay.

## 2018-05-12 NOTE — Telephone Encounter (Signed)
Left message on voicemail to call office.  

## 2018-05-12 NOTE — Telephone Encounter (Signed)
Copied from [place] [phone]. Topic: General - Other >> May 12, 2018 10:23 AM [name] wrote: Pt call to say these meds were issues by her previous doctor and now she Tammy Avery and is asking for refills    levothyroxine (SYNTHROID, LEVOTHROID) 75 MCG tablet   losartan (COZAAR) 100 MG tablet   pravastatin (PRAVACHOL) 40 MG tablet     [employer]

## 2018-05-13 ENCOUNTER — Encounter

## 2018-05-13 ENCOUNTER — Telehealth: Payer: Self-pay

## 2018-05-13 ENCOUNTER — Ambulatory Visit: Payer: 59 | Admitting: Physician Assistant

## 2018-05-13 NOTE — Telephone Encounter (Signed)
   [facility] Pre-operative Risk Assessment    Request for surgical clearance:  1. What type of surgery is being performed? Multiple teeth extraction  2. When is this surgery scheduled? TBD   3. What type of clearance is required (medical clearance vs. Pharmacy clearance to hold med vs. Both)? Medical  4. Are there any medications that need to be held prior to surgery and how long? None Specified    5. Practice name and name of physician performing surgery?  [facility]  6. What is your office phone number [phone]    7.   What is your office fax number [phone]  8.   Anesthesia type (None, local, MAC, general) ? IV Sedation    Mendel Ryder 05/13/2018, 4:49 PM  _________________________________________________________________   (provider comments below)

## 2018-05-13 NOTE — Telephone Encounter (Signed)
Left message on voicemail to call office. Please see Samantha's message and schedule.

## 2018-05-14 NOTE — Telephone Encounter (Signed)
LMOVM TO CONTACT CLINIC BACK  [phone]   TO SCHEDULE  APPT FOR CLEARANCE

## 2018-05-14 NOTE — Telephone Encounter (Signed)
   Primary Cardiologist:Mark Marlou Porch, MD  Chart reviewed as part of pre-operative protocol coverage. Because of Kayleeann A Haugen's past medical history and time since last visit, he/she will require a follow-up visit in order to better assess preoperative cardiovascular risk. Although teeth extraction is considered a low risk procedure we are not really adequately able to provide remote preop clearance as patient was last seen in 2017 and is well overdue for follow-up. This was recommended 06/2017 but did not occur.  It appears we got similar clearance request on 04/07/18 and appt was made for 6/26 but canceled by patient. This was then changed to 7/12. Callback team, please call dentist office to make them aware of the above.   I'll remove this request from the preop APP box.  Charlie Pitter, PA-C  05/14/2018, 3:05 PM

## 2018-05-15 NOTE — Telephone Encounter (Signed)
Mcneil, Ja-Kwan Communication 05/15/2018 11:44 AM   Reason for CRM: Pt returning call to office. Pt requests call back. Cb# [phone]   It appears that the patient was scheduled for the 1 month f/u. Contact patient to discuss call back

## 2018-05-15 NOTE — Telephone Encounter (Signed)
Pt has appt on 7/12.   Spoke with Crystal at [facility] to make aware pt has to come to appt before we can clear.

## 2018-05-18 NOTE — Telephone Encounter (Signed)
Spoke to pt told her Aldona Bar refilled her synthroid x 3 months. Also refilled losartan and pravastatin x 1 month. She said she saw you have an appt  with cardiology next month, I recommend that she attend that appointment. Additionally, I would like for her to see me in 1 month for follow-up so we can check cholesterol and address any other outstanding issues. Told pt I saw you have an appt with Samantha on 8/4 that is fine. Pt verbalized understanding.

## 2018-05-24 ENCOUNTER — Other Ambulatory Visit: Payer: Self-pay | Admitting: Physician Assistant

## 2018-05-28 NOTE — Progress Notes (Signed)
Cardiology Office Note   Date:  05/29/2018   ID:  Tammy Avery, DOB [birth year], MRN [id number]  PCP:  Inda Coke, PA  Cardiologist:  Dr. Marlou Porch    Chief Complaint  Patient presents with  . Pre-op Exam    dental extractions      History of Present Illness: Tammy Avery is a 57 y.o. female who presents for pre op clearance.  She needs multiple teeth extracted.  With Dr. Natividad Brood.    She has a hx of HTN, obesity, HLD and last seen by Dr. Marlou Porch 06/19/16   She has premature fH of MI in mother at 27,     She had nuc study 08/17/15 with EF 56%, normal study.   Today she is being evaluated for 4 teeth removal..  She has no chest pain and no SOB unless she walks uphill then mild.  She works nights so eats fast food frequently.  We discussed importance of less salt and eating healthy.  She is aware and wants to live healthier lifestyle.  She has arthritis as well so exercise is difficult.  We discussed water aerobics.  BP here is very high.       Home BPs 143/85; 157/89; 152/81; 163/92; 152/91  Last hgb A1c 5.6 LDL 104 + sleep apnea, to follow up in august, her machine is broken.    Past Medical History:  Diagnosis Date  . Anemia   . C. difficile diarrhea    2015 -- caught from mother  . Carpal tunnel syndrome   . Chronic migraine   . Congenital cataract    left eye, blind in left eye  . Connective tissue disease ([place])   . Diverticulitis    distal colon -- confrimed on CT on 01/16/16 and colonoscopy on 08/29/15  . H/O hiatal hernia    no meds - no problems  . Headache(784.0)    cannot tolerate many NSAIDs, would do injection of depomedrol in office  . HLA B27 (HLA B27 positive)   . Hyperlipidemia   . Hypertensive retinopathy   . Hypothyroidism    well controlled  . Kidney stones   . Memory loss    associated with peudotumor cerebri  . Obesity, morbid ([place])   . Osteoarthritis   . 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 Prediabetes   . Pseudotumor cerebri  syndrome    on lasix  . Radiculopathy   . Seasonal allergies   . Seizures ([place])     on meds - last one 10/18/11- meds increased -- thinks of soma  . Sinusitis   . Sleep apnea    Does use CPAP  . Spondylarthritis   . Transient alteration of awareness   . Uterine fibroid   . Vitamin D deficiency     Past Surgical History:  Procedure Laterality Date  . BACK SURGERY  09/28/2007   Degenerative Disc Disease - surgery on  L3/4, L4/5  . EYE SURGERY  1975   left eye cataract surgery  . IUD REMOVAL  01/09/2012   Procedure: INTRAUTERINE DEVICE (IUD) REMOVAL;  Surgeon: Marvene Staff, MD;  Location: [facility];  Service: Gynecology;  Laterality: N/A;  . OVARIAN CYST REMOVAL  01/09/2012   Procedure: OVARIAN CYSTECTOMY;  Surgeon: Marvene Staff, MD;  Location: [facility];  Service: Gynecology;  Laterality: Right;  . right knee arthroscopic  09/24/2006  . TONSILLECTOMY    . VESICOVAGINAL FISTULA CLOSURE W/ TAH     for fibroids  Current Outpatient Medications  Medication Sig Dispense Refill  . acetaminophen (TYLENOL) 500 MG tablet Take 500 mg by mouth every 6 (six) hours as needed. Patient used this medication for pain.    . Biotin (RA BIOTIN) 2.5 MG CAPS Take 1 capsule by mouth daily.    . Calcium Carbonate (CALCIUM 600 PO) Take 2 tablets by mouth daily.    . carvedilol (COREG) 25 MG tablet TAKE 1 TABLET BY MOUTH TWICE DAILY 60 tablet 0  . cetirizine (ZYRTEC) 10 MG tablet Take 1 tablet (10 mg total) by mouth daily. 30 tablet 11  . Cholecalciferol (VITAMIN D-3) 5000 units TABS Takes one tablet daily. 30 tablet   . Clobetasol & Clobetasol Emul 0.05 & 0.05 % MISC Apply 1 application topically 2 (two) times daily as needed.     Marland Kitchen CRANBERRY PO Take 2 capsules by mouth 3 (three) times daily.     . diclofenac sodium (VOLTAREN) 1 % GEL Apply 2 g topically 4 (four) times daily.    . furosemide (LASIX) 40 MG tablet TAKE 1 AND 1/2 TABLETS BY MOUTH DAILY 45 tablet 0  . gabapentin (NEURONTIN) 300  MG capsule take one by mouth at bedtime  4  . hydroxychloroquine (PLAQUENIL) 200 MG tablet Take 200 mg by mouth 2 (two) times daily.    Marland Kitchen levothyroxine (SYNTHROID, LEVOTHROID) 75 MCG tablet Take 1 tablet (75 mcg total) by mouth daily. 90 tablet 0  . losartan (COZAAR) 100 MG tablet Take 1 tablet (100 mg total) by mouth daily. 30 tablet 0  . Multiple Vitamin (MULTIVITAMIN) tablet Take 2 tablets by mouth daily.    . Omega-3 Fatty Acids (FISH OIL) 1000 MG CAPS Take 3 capsules by mouth daily.     . potassium chloride (K-DUR) 10 MEQ tablet Take 10 mEq by mouth 2 (two) times daily.    . pravastatin (PRAVACHOL) 40 MG tablet Take 1 tablet (40 mg total) by mouth daily. 30 tablet 0  . Pyridoxine HCl (VITAMIN B6) 200 MG TABS Take one tablet daily. 30 tablet   . zonisamide (ZONEGRAN) 100 MG capsule Take 300 mg by mouth daily.     No current facility-administered medications for this visit.     Allergies:   Advil [ibuprofen]; Aleve [naproxen sodium]; Benadryl [diphenhydramine]; Doxycycline; Lactose intolerance (gi); Limbrel [flavocoxid]; Mobic [meloxicam]; Neosporin [neomycin-bacitracin zn-polymyx]; Soma [carisoprodol]; Codeine; Latex; Morphine and related; and Penicillins    Social History:  The patient  reports that she has never smoked. She has never used smokeless tobacco. She reports that she does not drink alcohol or use drugs.   Family History:  The patient's family history includes Breast cancer in her sister; CAD in her father; CVA in her maternal grandmother; Dementia in her mother; Diabetes in her brother and mother; Heart disease in her father, maternal grandmother, and mother; Hypertension in her brother, mother, and sister; Lupus in her mother.    ROS:  General:no colds or fevers, + weight gain Skin:no rashes or ulcers HEENT:no blurred vision, no congestion CV:see HPI PUL:see HPI GI:no diarrhea constipation or melena, no indigestion GU:no hematuria, no dysuria MS:+ joint pain, no  claudication Neuro:no syncope, no lightheadedness Endo:no diabetes more pre-diabetes, + thyroid disease  Wt Readings from Last 3 Encounters:  05/29/18 237 lb (107.5 kg)  04/14/18 234 lb 3.2 oz (106.2 kg)  10/15/17 227 lb (103 kg)     PHYSICAL EXAM: VS:  BP (!) 160/120   Pulse 76   Ht 5' 3.25" (1.607 m)  Wt 237 lb (107.5 kg)   LMP 12/13/2011   SpO2 98%   BMI 41.65 kg/m  , BMI Body mass index is 41.65 kg/m. General:Pleasant affect, NAD Skin:Warm and dry, brisk capillary refill HEENT:normocephalic, sclera clear, mucus membranes moist Neck:supple, no JVD, no bruits  Heart:S1S2 RRR without murmur, gallup, rub or click Lungs:clear without rales, rhonchi, or wheezes BHA:LPFX, non tender, + BS, do not palpate liver spleen or masses Ext:no lower ext edema, 2+ pedal pulses, 2+ radial pulses Neuro:alert and oriented X 3, MAE, follows commands, + facial symmetry    EKG:  EKG is ordered today. The ekg ordered today demonstrates SR with LAFB and TWI inf lat that are old.     Recent Labs: 02/04/2018: ALT 26; BUN 13; Creatinine 0.8; Hemoglobin 13.7; Platelets 233; Potassium 4.1; Sodium 144 04/14/2018: TSH 2.06    Lipid Panel    Component Value Date/Time   CHOL 181 10/15/2017 1148   TRIG 105.0 10/15/2017 1148   HDL 55.70 10/15/2017 1148   CHOLHDL 3 10/15/2017 1148   VLDL 21.0 10/15/2017 1148   LDLCALC 104 (H) 10/15/2017 1148       Other studies Reviewed: Additional studies/ records that were reviewed today include: . NUC study 07/2015  Notes Recorded by [name], MD on 08/18/2015 at 6:45 AM   The left ventricular ejection fraction is normal (55-65%).   Nuclear stress EF: 56%.   There was no ST segment deviation noted during stress.   The study is normal.   This is a low risk study.  No evidence of ischemia or scar.  ASSESSMENT AND PLAN:  1.  Pre-op clearance, needs better control of BP, this has always been difficult and she does have some white coat  syndrome.  Will add amlodipine 5 mg and recheck BP in a week.  If improved will clear.  If not increase amlodipine.  BP should be under better control for surgery.    2.  OSA to follow up with neuro  3.   Morbid obesity discussed importance on exercise and diet.   4.  Pre-diabetes.  Again diet and exercise.     Current medicines are reviewed with the patient today.  The patient Has no concerns regarding medicines.  The following changes have been made:  See above Labs/ tests ordered today include:see above  Disposition:   FU:  see above  Signed, Cecilie Kicks, NP  05/29/2018 9:36 AM    [address] [place], [place] Phone: [phone]; Fax: [phone]

## 2018-05-29 ENCOUNTER — Ambulatory Visit (INDEPENDENT_AMBULATORY_CARE_PROVIDER_SITE_OTHER): Payer: 59 | Admitting: Cardiology

## 2018-05-29 ENCOUNTER — Encounter: Payer: Self-pay | Admitting: Cardiology

## 2018-05-29 VITALS — BP 160/120 | HR 76 | Ht 63.25 in | Wt 237.0 lb

## 2018-05-29 DIAGNOSIS — R7303 Prediabetes: Secondary | ICD-10-CM | POA: Diagnosis not present

## 2018-05-29 DIAGNOSIS — I10 Essential (primary) hypertension: Secondary | ICD-10-CM | POA: Diagnosis not present

## 2018-05-29 DIAGNOSIS — G4733 Obstructive sleep apnea (adult) (pediatric): Secondary | ICD-10-CM | POA: Diagnosis not present

## 2018-05-29 MED ORDER — AMLODIPINE BESYLATE 5 MG PO TABS: 5.0000 mg | ORAL_TABLET | Freq: Every day | ORAL | 11 refills | Status: DC

## 2018-05-29 NOTE — Patient Instructions (Signed)
Medication Instructions:  1) START AMLODIPINE 5 mg daily today  Labwork: None  Testing/Procedures: None  Follow-Up: You have an appointment for a blood pressure check here at [facility] next Friday at 11:00AM. If your blood pressure is controlled, you will be cleared for surgery.  Your provider wants you to follow-up in: 6 months with Dr. Marlou Porch. You will receive a reminder letter in the mail two months in advance. If you don't receive a letter, please call our office to schedule the follow-up appointment.    Any Other Special Instructions Will Be Listed Below (If Applicable).     If you need a refill on your cardiac medications before your next appointment, please call your pharmacy.

## 2018-06-04 ENCOUNTER — Other Ambulatory Visit: Payer: Self-pay | Admitting: Cardiology

## 2018-06-04 ENCOUNTER — Ambulatory Visit: Payer: 59

## 2018-06-05 ENCOUNTER — Telehealth: Payer: Self-pay | Admitting: Cardiology

## 2018-06-05 ENCOUNTER — Ambulatory Visit (INDEPENDENT_AMBULATORY_CARE_PROVIDER_SITE_OTHER): Payer: 59

## 2018-06-05 VITALS — BP 146/96 | HR 72 | Ht 63.0 in | Wt 237.0 lb

## 2018-06-05 DIAGNOSIS — E785 Hyperlipidemia, unspecified: Secondary | ICD-10-CM

## 2018-06-05 DIAGNOSIS — I10 Essential (primary) hypertension: Secondary | ICD-10-CM | POA: Diagnosis not present

## 2018-06-05 MED ORDER — AMLODIPINE BESYLATE 5 MG PO TABS: 10.0000 mg | ORAL_TABLET | Freq: Every day | ORAL | 11 refills | Status: DC

## 2018-06-05 NOTE — Telephone Encounter (Signed)
Pt's BP was improved,  But still elevated so we increased the amlodipine to 10 mg,  She will return in 1 week for BP check then will be cleared for surgery.

## 2018-06-05 NOTE — Progress Notes (Signed)
Pt presents today for a BP follow up after a medication change for Pre-Op clearance. She has no c/o pain or SOB.   Per Cecilie Kicks, pt is to increase her Amlodipine to 10mg  per day with f/up RN visit next week for a BP visit. Pt's BP needs to decrease further until she can be cleared for surgery.   Pt agrees to plan and has no questions.

## 2018-06-09 ENCOUNTER — Telehealth: Payer: Self-pay | Admitting: *Deleted

## 2018-06-09 NOTE — Telephone Encounter (Signed)
   [facility] Pre-operative Risk Assessment    Request for surgical clearance:  1. What type of surgery is being performed? Multiple teeth extracted   2. When is this surgery scheduled? TBD   3. What type of clearance is required (medical clearance vs. Pharmacy clearance to hold med vs. Both)? medical  4. Are there any medications that need to be held prior to surgery and how long?n/a   5. Practice name and name of physician performing surgery? Dr Berniece Pap and Dr Joneen Car[facility]   6. What is your office phone number [phone]    7.   What is your office fax number [phone]  8.   Anesthesia type (None, local, MAC, general) ? IV sedation   Also requesting records.  I have given to MR to handle    Tammy Avery 06/09/2018, 5:05 PM  _________________________________________________________________   (provider comments below)

## 2018-06-11 NOTE — Telephone Encounter (Signed)
Per Karilyn Cota note on 05/29/18, BP needs to be better-controlled prior to tooth extraction surgery. She has an appt on 06/16/18 for BP check. Will re-address then.

## 2018-06-12 ENCOUNTER — Telehealth: Payer: Self-pay | Admitting: Physician Assistant

## 2018-06-12 NOTE — Telephone Encounter (Signed)
Copied from [place] [id number]. Topic: Quick Communication - Rx Refill/Question >> Jun 12, 2018  2:48 PM Jarold Motto, Fraser Din wrote: Medication:potassium chloride (K-DUR) 10 MEQ tablet pt called and stated that her old pcp prescribed this medication but needed Inda Coke to fill because she is now her pcp   Has the patient contacted their pharmacy? No  Preferred Pharmacy (with phone number or street name): [facility] [address] [place] [phone] (Phone) [phone] (Fax)  Agent: Please be advised that RX refills may take up to 3 business days. We ask that you follow-up with your pharmacy.

## 2018-06-13 ENCOUNTER — Other Ambulatory Visit: Payer: Self-pay | Admitting: Physician Assistant

## 2018-06-15 NOTE — Telephone Encounter (Signed)
Medication filled on 06/15/18

## 2018-06-16 ENCOUNTER — Telehealth: Payer: Self-pay | Admitting: Cardiology

## 2018-06-16 ENCOUNTER — Ambulatory Visit (INDEPENDENT_AMBULATORY_CARE_PROVIDER_SITE_OTHER): Payer: 59

## 2018-06-16 VITALS — BP 124/80 | HR 74 | Ht 63.0 in | Wt 238.0 lb

## 2018-06-16 DIAGNOSIS — I10 Essential (primary) hypertension: Secondary | ICD-10-CM | POA: Diagnosis not present

## 2018-06-16 NOTE — Patient Instructions (Signed)
Medication Instructions:  Your physician recommends that you continue on your current medications as directed. Please refer to the Current Medication list given to you today.  Labwork: NONE  Testing/Procedures: NONE  Follow-Up: Your physician recommends that you follow up as directed at your last visit.   If you need a refill on your cardiac medications before your next appointment, please call your pharmacy.

## 2018-06-16 NOTE — Progress Notes (Signed)
1.) Reason for visit: BP Check  2.) Name of MD requesting visit: Cecilie Kicks NP  3.) H&P: Patient has history of HTN, obesity and HLD.   4.) ROS related to problem: Patient's vitals BP 124/80, HR 74, O2 Sat 99% on room air, weight 238 lbs, and height 5'3". Patient stated she has felt a little drowsy. Patient has started a new diet that cuts out a lot of processed foods and fatty meats. Patient has been check her BP at home, and it was 116/72 with HR 72 this AM.  5.) Assessment and plan per MD: Cecilie Kicks NP reviewed patient's BP and has cleared her for surgery. Encouraged patient to check her BP once a daily two hours after taking her medications. Encouraged patient to call our office if her BP is too low.

## 2018-06-16 NOTE — Telephone Encounter (Signed)
   Primary Cardiologist: Candee Furbish, MD  Chart reviewed as part of pre-operative protocol coverage. Given past medical history and time since last visit, based on ACC/AHA guidelines, Tammy Avery would be at acceptable risk for the planned procedure without further cardiovascular testing.  With medication adjustments her BP is under control.     I will route this recommendation to the requesting party via Epic fax function and remove from pre-op pool.  Please call with questions.  Cecilie Kicks, NP 06/16/2018, 3:08 PM

## 2018-06-19 ENCOUNTER — Telehealth: Payer: Self-pay

## 2018-06-19 MED ORDER — AMLODIPINE BESYLATE 10 MG PO TABS: 10.0000 mg | ORAL_TABLET | Freq: Every day | ORAL | 3 refills | Status: DC

## 2018-06-19 NOTE — Addendum Note (Signed)
Addended by: Aris Georgia, Kaleia Longhi L on: 06/19/2018 03:57 PM   Modules accepted: Orders

## 2018-06-19 NOTE — Telephone Encounter (Signed)
Sent in amlodipine 10 mg by mouth daily to patient's pharmacy of choice.

## 2018-06-19 NOTE — Telephone Encounter (Signed)
Pt is requesting a refill for amlodipine 10 mg which is not listed on her medication list. Please address. Thank You.

## 2018-06-24 ENCOUNTER — Institutional Professional Consult (permissible substitution): Payer: 59 | Admitting: Neurology

## 2018-06-25 ENCOUNTER — Other Ambulatory Visit: Payer: Self-pay | Admitting: Physician Assistant

## 2018-07-01 ENCOUNTER — Ambulatory Visit (INDEPENDENT_AMBULATORY_CARE_PROVIDER_SITE_OTHER): Payer: 59 | Admitting: Physician Assistant

## 2018-07-01 ENCOUNTER — Other Ambulatory Visit: Payer: Self-pay | Admitting: Physician Assistant

## 2018-07-01 ENCOUNTER — Encounter: Payer: Self-pay | Admitting: Physician Assistant

## 2018-07-01 VITALS — BP 150/86 | HR 72 | Temp 98.1°F | Ht 63.0 in | Wt 235.0 lb

## 2018-07-01 DIAGNOSIS — I10 Essential (primary) hypertension: Secondary | ICD-10-CM

## 2018-07-01 DIAGNOSIS — M47819 Spondylosis without myelopathy or radiculopathy, site unspecified: Secondary | ICD-10-CM

## 2018-07-01 DIAGNOSIS — Z0000 Encounter for general adult medical examination without abnormal findings: Secondary | ICD-10-CM

## 2018-07-01 DIAGNOSIS — R7303 Prediabetes: Secondary | ICD-10-CM | POA: Diagnosis not present

## 2018-07-01 DIAGNOSIS — R569 Unspecified convulsions: Secondary | ICD-10-CM

## 2018-07-01 DIAGNOSIS — E039 Hypothyroidism, unspecified: Secondary | ICD-10-CM

## 2018-07-01 DIAGNOSIS — E785 Hyperlipidemia, unspecified: Secondary | ICD-10-CM | POA: Diagnosis not present

## 2018-07-01 DIAGNOSIS — G4733 Obstructive sleep apnea (adult) (pediatric): Secondary | ICD-10-CM

## 2018-07-01 DIAGNOSIS — Z1231 Encounter for screening mammogram for malignant neoplasm of breast: Secondary | ICD-10-CM

## 2018-07-01 LAB — CBC
HCT: 40.6 % (ref 36.0–46.0)
Hemoglobin: 13.6 g/dL (ref 12.0–15.0)
MCHC: 33.6 g/dL (ref 30.0–36.0)
MCV: 87.2 fl (ref 78.0–100.0)
Platelets: 247 10*3/uL (ref 150.0–400.0)
RBC: 4.66 Mil/uL (ref 3.87–5.11)
RDW: 13 % (ref 11.5–15.5)
WBC: 3.6 10*3/uL — ABNORMAL LOW (ref 4.0–10.5)

## 2018-07-01 LAB — COMPREHENSIVE METABOLIC PANEL
ALT: 21 U/L (ref 0–35)
AST: 21 U/L (ref 0–37)
Albumin: 4.1 g/dL (ref 3.5–5.2)
Alkaline Phosphatase: 68 U/L (ref 39–117)
BUN: 15 mg/dL (ref 6–23)
CO2: 31 mEq/L (ref 19–32)
Calcium: 9.7 mg/dL (ref 8.4–10.5)
Chloride: 105 mEq/L (ref 96–112)
Creatinine, Ser: 0.93 mg/dL (ref 0.40–1.20)
GFR: 80.24 mL/min (ref >60.00)
Glucose, Bld: 88 mg/dL (ref 70–99)
Potassium: 3.7 mEq/L (ref 3.5–5.1)
Sodium: 142 mEq/L (ref 135–145)
Total Bilirubin: 0.5 mg/dL (ref 0.2–1.2)
Total Protein: 7 g/dL (ref 6.0–8.3)

## 2018-07-01 LAB — LIPID PANEL
Cholesterol: 208 mg/dL — ABNORMAL HIGH (ref 0–200)
HDL: 70.9 mg/dL (ref >39.00)
LDL Cholesterol: 98 mg/dL (ref 0–99)
NonHDL: 136.86
Total CHOL/HDL Ratio: 3
Triglycerides: 192 mg/dL — ABNORMAL HIGH (ref 0.0–149.0)
VLDL: 38.4 mg/dL (ref 0.0–40.0)

## 2018-07-01 MED ORDER — LOSARTAN POTASSIUM 100 MG PO TABS: 100.0000 mg | ORAL_TABLET | Freq: Every day | ORAL | 0 refills | Status: DC

## 2018-07-01 MED ORDER — GABAPENTIN 300 MG PO CAPS: 300.0000 mg | ORAL_CAPSULE | Freq: Every day | ORAL | 0 refills | Status: DC

## 2018-07-01 NOTE — Progress Notes (Signed)
Subjective:    BARBRA MINER is a 57 y.o. female and is here for a comprehensive physical exam.   HPI  There are no preventive care reminders to display for this patient.  Acute Concerns: None  Chronic Issues: HTN -- Currently taking Amlodopine 61m,  Coreg 274m  Lasix 6058mLosartan 100m60mt home blood pressure readings are on average 120/80. Patient denies chest pain, SOB, blurred vision, dizziness, unusual headaches, lower leg swelling. Patient is compliant with medication. Denies excessive caffeine intake, stimulant usage, excessive alcohol intake, or increase in salt consumption.  Prediabetes -- most recently check in May 2019, she is working on vegetarian diet, continues to over-indulge in sweets Lab Results  Component Value Date   HGBA1C 5.6 04/14/2018  [facility] Obesity -- working on vegetarian diet Hypothyroidism -- compliant with levothyroxine, labs last checked in May 2019 HLD -- currently on pravastatin 40 mg daily, most recent lipid panel in Nov 2018 Seizures -- last saw neurology in June 2019, Zonegran/zonisamide increased at last visit Sp[facility]yl[facility] currently on Plaquenil 200 mg  Wt Readings from Last 5 Encounters:  07/01/18 235 lb (106.6 kg)  06/16/18 238 lb (108 kg)  06/05/18 237 lb (107.5 kg)  05/29/18 237 lb (107.5 kg)  04/14/18 234 lb 3.2 oz (106.2 kg)   Health Maintenance: Immunizations -- UTD Colonoscopy -- repeat in 2026 Mammogram -- scheduled Sep 2011 PAP -- n/a Diet -- vegetarian diet, still struggling with sweets Sleep habits -- OSA eval pending, works 3rd shift Exercise -- unable to do 2/2 joint pain Weight -- Weight: 235 lb (106.6 kg)  Mood -- good other than stress with work Weight history: Wt Readings from Last 10 Encounters:  07/01/18 235 lb (106.6 kg)  06/16/18 238 lb (108 kg)  06/05/18 237 lb (107.5 kg)  05/29/18 237 lb (107.5 kg)    04/14/18 234 lb 3.2 oz (106.2 kg)  10/15/17 227 lb (103 kg)  04/30/17 234 lb (106.1 kg)  06/19/16 245 lb 6.4 oz (111.3 kg)  08/16/15 245 lb (111.1 kg)  08/10/15 245 lb 6.4 oz (111.3 kg)   Patient's last menstrual period was 12/13/2011. Period characteristics: none Alcohol use: minimal Tobacco use: not smoking  Depression screen PHQ 2/9 07/01/2018  Decreased Interest 0  Down, Depressed, Hopeless 0  PHQ - 2 Score 0    Other providers/specialists: Patient Care Team: WorlInda Coke aUtahPCP - General (Physician Assistant) SkaiJerline Pain as PCP - Cardiology (Cardiology) BoggMatilde Bash as Referring Physician (Neurology) WeavSharmon ReverePhysician Assistant (Cardiology) Conner, KellDanton Sewer-C (Psychiatry) Conner, KellDanton Sewer-C (Psychiatry)   PMHx, SurgHx, SocialHx, Medications, and Allergies were reviewed in the Visit Navigator and updated as appropriate.   Past Medical History:  Diagnosis Date  . Anemia   . C. difficile diarrhea    2015 -- caught from mother  . Carpal tunnel syndrome   . Chronic migraine   . Congenital cataract    left eye, blind in left eye  . Connective tissue disease (HCC)[place]. Diverticulitis    distal colon -- confrimed on CT on 01/16/16 and colonoscopy on 08/29/15  . H/O hiatal hernia    no meds - no problems  . Headache(784.0)    cannot tolerate many NSAIDs, would do injection of depomedrol in office  . HLA B27 (HLA B27 positive)   . Hyperlipidemia   .  Hypertensive retinopathy   . Hypothyroidism    well controlled  . Kidney stones   . Memory loss    associated with peudotumor cerebri  . Obesity, morbid ([place])   . Osteoarthritis   . 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 Prediabetes   . Pseudotumor cerebri syndrome    on lasix  . Radiculopathy   . Seasonal allergies   . Seizures ([place])     on meds - last one 10/18/11- meds increased -- thinks of soma  . Sinusitis   . Sleep apnea    Does use CPAP  . Spondylarthritis   .  Transient alteration of awareness   . Uterine fibroid   . Vitamin D deficiency      Past Surgical History:  Procedure Laterality Date  . BACK SURGERY  09/28/2007   Degenerative Disc Disease - surgery on  L3/4, L4/5  . EYE SURGERY  1975   left eye cataract surgery  . IUD REMOVAL  01/09/2012   Procedure: INTRAUTERINE DEVICE (IUD) REMOVAL;  Surgeon: Marvene Staff, MD;  Location: [facility];  Service: Gynecology;  Laterality: N/A;  . OVARIAN CYST REMOVAL  01/09/2012   Procedure: OVARIAN CYSTECTOMY;  Surgeon: Marvene Staff, MD;  Location: [facility];  Service: Gynecology;  Laterality: Right;  . right knee arthroscopic  09/24/2006  . TONSILLECTOMY    . VESICOVAGINAL FISTULA CLOSURE W/ TAH     for fibroids     Family History  Problem Relation Age of Onset  . Hypertension Mother   . Heart disease Mother   . Diabetes Mother   . Lupus Mother   . Dementia Mother   . CAD Father   . Heart disease Father   . Hypertension Brother   . Diabetes Brother   . Hypertension Sister   . Breast cancer Sister   . Heart disease Maternal Grandmother   . CVA Maternal Grandmother   . Colon cancer Neg Hx     Social History   Tobacco Use  . Smoking status: Never Smoker  . Smokeless tobacco: Never Used  Substance Use Topics  . Alcohol use: Yes    Alcohol/week: 1.0 standard drinks    Types: 1 Glasses of wine per week  . Drug use: No    Review of Systems:   Review of Systems  Constitutional: Negative for chills, fever, malaise/fatigue and weight loss.  HENT: Negative for hearing loss, sinus pain and sore throat.   Respiratory: Negative for cough and hemoptysis.   Cardiovascular: Negative for chest pain, palpitations, leg swelling and PND.  Gastrointestinal: Negative for abdominal pain, constipation, diarrhea, heartburn, nausea and vomiting.  Genitourinary: Negative for dysuria, frequency and urgency.  Musculoskeletal: Negative for back pain, myalgias and neck pain.  Skin: Negative for  itching and rash.  Neurological: Negative for dizziness, tingling, seizures and headaches.  Endo/Heme/Allergies: Negative for polydipsia.  Psychiatric/Behavioral: Negative for depression. The patient is not nervous/anxious.      Objective:   BP (!) 150/86 (BP Location: Left Arm, Cuff Size: Large)   Pulse 72   Temp 98.1 F (36.7 C) (Oral)   Ht 5' 3"  (1.6 m)   Wt 235 lb (106.6 kg)   LMP 12/13/2011   SpO2 97%   BMI 41.63 kg/m  Body mass index is 41.63 kg/m.   General Appearance:    Alert, cooperative, no distress, appears stated age  Head:    Normocephalic, without obvious abnormality, atraumatic  Eyes:    PERRL, conjunctiva/corneas clear, EOM's intact, fundi  benign, both eyes  Ears:    Normal TM's and external ear canals, both ears  Nose:   Nares normal, septum midline, mucosa normal, no drainage    or sinus tenderness  Throat:   Lips, mucosa, and tongue normal; teeth and gums normal  Neck:   Supple, symmetrical, trachea midline, no adenopathy;    thyroid:  no enlargement/tenderness/nodules; no carotid   bruit or JVD  Back:     Symmetric, no curvature, ROM normal, no CVA tenderness  Lungs:     Clear to auscultation bilaterally, respirations unlabored  Chest Wall:    No tenderness or deformity   Heart:    Regular rate and rhythm, S1 and S2 normal, no murmur, rub   or gallop  Breast Exam:    No tenderness, masses, or nipple abnormality  Abdomen:     Soft, non-tender, bowel sounds active all four quadrants,    no masses, no organomegaly  Genitalia:    Normal female without lesion, discharge or tenderness  Extremities:   Extremities normal, atraumatic, no cyanosis or edema  Pulses:   2+ and symmetric all extremities  Skin:   Skin color, texture, turgor normal, no rashes or lesions  Lymph nodes:   Cervical, supraclavicular, and axillary nodes normal  Neurologic:   CNII-XII intact, normal strength, sensation and reflexes    throughout    Assessment/Plan:   Erika was seen  today for hypothyroidism.  Diagnoses and all orders for this visit:  Routine physical examination Today patient counseled on age appropriate routine health concerns for screening and prevention, each reviewed and up to date or declined. Immunizations reviewed and up to date or declined. Labs ordered and reviewed. Risk factors for depression reviewed and negative. Hearing function and visual acuity are intact. ADLs screened and addressed as needed. Functional ability and level of safety reviewed and appropriate. Education, counseling and referrals performed based on assessed risks today. Patient provided with a copy of personalized plan for preventive services.  Essential hypertension Management per cardiology. Currently well-controlled on patient's home monitor. Reviewed her readings today. -     CBC -     Comprehensive metabolic panel  Hypothyroidism, unspecified type Currently stable.  Hyperlipidemia, unspecified hyperlipidemia type Re-check. -     Lipid panel  Prediabetes Too soon for re-peat HgbA1c. Will re-check at follow-up. Encouraged ongoing weight loss and need for portion control of sugary foods. -     Comprehensive metabolic panel  Morbid obesity ([place]) Continue to work on weight loss.  OSA (obstructive sleep apnea) Sleep study appointment has been scheduled, encouraged follow-up.  Spondylarthritis Management per rheumatologist.  Seizures ([place]) Management per neurology.  Other orders -     losartan (COZAAR) 100 MG tablet; Take 1 tablet (100 mg total) by mouth daily. -     gabapentin (NEURONTIN) 300 MG capsule; Take 1 capsule (300 mg total) by mouth at bedtime.     Well Adult Exam: Labs ordered: Yes. Patient counseling was done. See below for items discussed. Discussed the patient's BMI.  The BMI BMI is not in the acceptable range; BMI management plan is completed Follow up in 3 months. Breast cancer screening: scheduled for Sep 11. Cervical cancer screening: n/a    Patient Counseling: [x]    Nutrition: Stressed importance of moderation in sodium/caffeine intake, saturated fat and cholesterol, caloric balance, sufficient intake of fresh fruits, vegetables, fiber, calcium, iron, and 1 mg of folate supplement per day (for females capable of pregnancy).  [x]    Stressed the  importance of regular exercise.   [x]    Substance Abuse: Discussed cessation/primary prevention of tobacco, alcohol, or other drug use; driving or other dangerous activities under the influence; availability of treatment for abuse.   [x]    Injury prevention: Discussed safety belts, safety helmets, smoke detector, smoking near bedding or upholstery.   [x]    Sexuality: Discussed sexually transmitted diseases, partner selection, use of condoms, avoidance of unintended pregnancy  and contraceptive alternatives.  [x]    Dental health: Discussed importance of regular tooth brushing, flossing, and dental visits.  [x]    Health maintenance and immunizations reviewed. Please refer to Health maintenance section.   Inda Coke, PA-C [place]

## 2018-07-01 NOTE — Patient Instructions (Signed)
It was great to see you!  Keep an eye on blood pressure - if consistently >140/90, please let cardiology know.  Let's follow-up in 3 months, sooner if you have concerns.  Take care,  [place] Maintenance, Female Adopting a healthy lifestyle and getting preventive care can go a long way to promote health and wellness. Talk with your health care provider about what schedule of regular examinations is right for you. This is a good chance for you to check in with your provider about disease prevention and staying healthy. In between checkups, there are plenty of things you can do on your own. Experts have done a lot of research about which lifestyle changes and preventive measures are most likely to keep you healthy. Ask your health care provider for more information. Weight and diet Eat a healthy diet  Be sure to include plenty of vegetables, fruits, low-fat dairy products, and lean protein.  Do not eat a lot of foods high in solid fats, added sugars, or salt.  Get regular exercise. This is one of the most important things you can do for your health. ? Most adults should exercise for at least 150 minutes each week. The exercise should increase your heart rate and make you sweat (moderate-intensity exercise). ? Most adults should also do strengthening exercises at least twice a week. This is in addition to the moderate-intensity exercise.  Maintain a healthy weight  Body mass index (BMI) is a measurement that can be used to identify possible weight problems. It estimates body fat based on height and weight. Your health care provider can help determine your BMI and help you achieve or maintain a healthy weight.  For females 69 years of age and older: ? A BMI below 18.5 is considered underweight. ? A BMI of 18.5 to 24.9 is normal. ? A BMI of 25 to 29.9 is considered overweight. ? A BMI of 30 and above is considered obese.  Watch levels of cholesterol and blood  lipids  You should start having your blood tested for lipids and cholesterol at 56 years of age, then have this test every 5 years.  You may need to have your cholesterol levels checked more often if: ? Your lipid or cholesterol levels are high. ? You are older than 56 years of age. ? You are at high risk for heart disease.  Cancer screening Lung Cancer  Lung cancer screening is recommended for adults 38-20 years old who are at high risk for lung cancer because of a history of smoking.  A yearly low-dose CT scan of the lungs is recommended for people who: ? Currently smoke. ? Have quit within the past 15 years. ? Have at least a 30-pack-year history of smoking. A pack year is smoking an average of one pack of cigarettes a day for 1 year.  Yearly screening should continue until it has been 15 years since you quit.  Yearly screening should stop if you develop a health problem that would prevent you from having lung cancer treatment.  Breast Cancer  Practice breast self-awareness. This means understanding how your breasts normally appear and feel.  It also means doing regular breast self-exams. Let your health care provider know about any changes, no matter how small.  If you are in your 20s or 30s, you should have a clinical breast exam (CBE) by a health care provider every 1-3 years as part of a regular health exam.  If you are 40 or older,  have a CBE every year. Also consider having a breast X-ray (mammogram) every year.  If you have a family history of breast cancer, talk to your health care provider about genetic screening.  If you are at high risk for breast cancer, talk to your health care provider about having an MRI and a mammogram every year.  Breast cancer gene (BRCA) assessment is recommended for women who have family members with BRCA-related cancers. BRCA-related cancers include: ? Breast. ? Ovarian. ? Tubal. ? Peritoneal cancers.  Results of the assessment will  determine the need for genetic counseling and BRCA1 and BRCA2 testing.  Cervical Cancer Your health care provider may recommend that you be screened regularly for cancer of the pelvic organs (ovaries, uterus, and vagina). This screening involves a pelvic examination, including checking for microscopic changes to the surface of your cervix (Pap test). You may be encouraged to have this screening done every 3 years, beginning at age 71.  For women ages 60-65, health care providers may recommend pelvic exams and Pap testing every 3 years, or they may recommend the Pap and pelvic exam, combined with testing for human papilloma virus (HPV), every 5 years. Some types of HPV increase your risk of cervical cancer. Testing for HPV may also be done on women of any age with unclear Pap test results.  Other health care providers may not recommend any screening for nonpregnant women who are considered low risk for pelvic cancer and who do not have symptoms. Ask your health care provider if a screening pelvic exam is right for you.  If you have had past treatment for cervical cancer or a condition that could lead to cancer, you need Pap tests and screening for cancer for at least 20 years after your treatment. If Pap tests have been discontinued, your risk factors (such as having a new sexual partner) need to be reassessed to determine if screening should resume. Some women have medical problems that increase the chance of getting cervical cancer. In these cases, your health care provider may recommend more frequent screening and Pap tests.  Colorectal Cancer  This type of cancer can be detected and often prevented.  Routine colorectal cancer screening usually begins at 56 years of age and continues through 56 years of age.  Your health care provider may recommend screening at an earlier age if you have risk factors for colon cancer.  Your health care provider may also recommend using home test kits to check  for hidden blood in the stool.  A small camera at the end of a tube can be used to examine your colon directly (sigmoidoscopy or colonoscopy). This is done to check for the earliest forms of colorectal cancer.  Routine screening usually begins at age 15.  Direct examination of the colon should be repeated every 5-10 years through 56 years of age. However, you may need to be screened more often if early forms of precancerous polyps or small growths are found.  Skin Cancer  Check your skin from head to toe regularly.  Tell your health care provider about any new moles or changes in moles, especially if there is a change in a mole's shape or color.  Also tell your health care provider if you have a mole that is larger than the size of a pencil eraser.  Always use sunscreen. Apply sunscreen liberally and repeatedly throughout the day.  Protect yourself by wearing long sleeves, pants, a wide-brimmed hat, and sunglasses whenever you are outside.  Heart disease, diabetes, and high blood pressure  High blood pressure causes heart disease and increases the risk of stroke. High blood pressure is more likely to develop in: ? People who have blood pressure in the high end of the normal range (130-139/85-89 mm Hg). ? People who are overweight or obese. ? People who are African American.  If you are 3-21 years of age, have your blood pressure checked every 3-5 years. If you are 28 years of age or older, have your blood pressure checked every year. You should have your blood pressure measured twice-once when you are at a hospital or clinic, and once when you are not at a hospital or clinic. Record the average of the two measurements. To check your blood pressure when you are not at a hospital or clinic, you can use: ? An automated blood pressure machine at a pharmacy. ? A home blood pressure monitor.  If you are between 62 years and 77 years old, ask your health care provider if you should take  aspirin to prevent strokes.  Have regular diabetes screenings. This involves taking a blood sample to check your fasting blood sugar level. ? If you are at a normal weight and have a low risk for diabetes, have this test once every three years after 56 years of age. ? If you are overweight and have a high risk for diabetes, consider being tested at a younger age or more often. Preventing infection Hepatitis B  If you have a higher risk for hepatitis B, you should be screened for this virus. You are considered at high risk for hepatitis B if: ? You were born in a country where hepatitis B is common. Ask your health care provider which countries are considered high risk. ? Your parents were born in a high-risk country, and you have not been immunized against hepatitis B (hepatitis B vaccine). ? You have HIV or AIDS. ? You use needles to inject street drugs. ? You live with someone who has hepatitis B. ? You have had sex with someone who has hepatitis B. ? You get hemodialysis treatment. ? You take certain medicines for conditions, including cancer, organ transplantation, and autoimmune conditions.  Hepatitis C  Blood testing is recommended for: ? Everyone born from [birth year] through 1964. ? Anyone with known risk factors for hepatitis C.  Sexually transmitted infections (STIs)  You should be screened for sexually transmitted infections (STIs) including gonorrhea and chlamydia if: ? You are sexually active and are younger than 56 years of age. ? You are older than 56 years of age and your health care provider tells you that you are at risk for this type of infection. ? Your sexual activity has changed since you were last screened and you are at an increased risk for chlamydia or gonorrhea. Ask your health care provider if you are at risk.  If you do not have HIV, but are at risk, it may be recommended that you take a prescription medicine daily to prevent HIV infection. This is called  pre-exposure prophylaxis (PrEP). You are considered at risk if: ? You are sexually active and do not regularly use condoms or know the HIV status of your partner(s). ? You take drugs by injection. ? You are sexually active with a partner who has HIV.  Talk with your health care provider about whether you are at high risk of being infected with HIV. If you choose to begin PrEP, you should first be tested for HIV.  You should then be tested every 3 months for as long as you are taking PrEP. Pregnancy  If you are premenopausal and you may become pregnant, ask your health care provider about preconception counseling.  If you may become pregnant, take 400 to 800 micrograms (mcg) of folic acid every day.  If you want to prevent pregnancy, talk to your health care provider about birth control (contraception). Osteoporosis and menopause  Osteoporosis is a disease in which the bones lose minerals and strength with aging. This can result in serious bone fractures. Your risk for osteoporosis can be identified using a bone density scan.  If you are 72 years of age or older, or if you are at risk for osteoporosis and fractures, ask your health care provider if you should be screened.  Ask your health care provider whether you should take a calcium or vitamin D supplement to lower your risk for osteoporosis.  Menopause may have certain physical symptoms and risks.  Hormone replacement therapy may reduce some of these symptoms and risks. Talk to your health care provider about whether hormone replacement therapy is right for you. Follow these instructions at home:  Schedule regular health, dental, and eye exams.  Stay current with your immunizations.  Do not use any tobacco products including cigarettes, chewing tobacco, or electronic cigarettes.  If you are pregnant, do not drink alcohol.  If you are breastfeeding, limit how much and how often you drink alcohol.  Limit alcohol intake to no more  than 1 drink per day for nonpregnant women. One drink equals 12 ounces of beer, 5 ounces of wine, or 1 ounces of hard liquor.  Do not use street drugs.  Do not share needles.  Ask your health care provider for help if you need support or information about quitting drugs.  Tell your health care provider if you often feel depressed.  Tell your health care provider if you have ever been abused or do not feel safe at home. This information is not intended to replace advice given to you by your health care provider. Make sure you discuss any questions you have with your health care provider. Document Released: 05/20/2011 Document Revised: 04/11/2016 Document Reviewed: 08/08/2015 Elsevier Interactive Patient Education  [employer].

## 2018-07-02 ENCOUNTER — Other Ambulatory Visit: Payer: Self-pay | Admitting: Physician Assistant

## 2018-07-02 MED ORDER — PRAVASTATIN SODIUM 40 MG PO TABS: 40.0000 mg | ORAL_TABLET | Freq: Every day | ORAL | 0 refills | Status: DC

## 2018-07-08 ENCOUNTER — Other Ambulatory Visit: Payer: Self-pay | Admitting: Physician Assistant

## 2018-07-29 ENCOUNTER — Ambulatory Visit
Admission: RE | Admit: 2018-07-29 | Discharge: 2018-07-29 | Disposition: A | Discharge status: Home / Self Care | Payer: 59 | Source: Ambulatory Visit | Attending: Physician Assistant | Admitting: Physician Assistant

## 2018-07-29 DIAGNOSIS — Z1231 Encounter for screening mammogram for malignant neoplasm of breast: Secondary | ICD-10-CM

## 2018-07-30 ENCOUNTER — Other Ambulatory Visit: Payer: Self-pay | Admitting: Physician Assistant

## 2018-08-25 ENCOUNTER — Other Ambulatory Visit: Payer: Self-pay | Admitting: Physician Assistant

## 2018-08-27 ENCOUNTER — Other Ambulatory Visit: Payer: Self-pay | Admitting: Physician Assistant

## 2018-09-08 ENCOUNTER — Other Ambulatory Visit: Payer: Self-pay | Admitting: Neurosurgery

## 2018-09-08 DIAGNOSIS — M5441 Lumbago with sciatica, right side: Principal | ICD-10-CM

## 2018-09-08 DIAGNOSIS — G8929 Other chronic pain: Secondary | ICD-10-CM

## 2018-09-08 DIAGNOSIS — M5442 Lumbago with sciatica, left side: Principal | ICD-10-CM

## 2018-09-26 ENCOUNTER — Other Ambulatory Visit: Payer: Self-pay | Admitting: Physician Assistant

## 2018-10-02 ENCOUNTER — Other Ambulatory Visit: Payer: Self-pay | Admitting: Physician Assistant

## 2018-10-19 ENCOUNTER — Ambulatory Visit: Payer: 59 | Admitting: Physician Assistant

## 2018-10-20 ENCOUNTER — Ambulatory Visit (INDEPENDENT_AMBULATORY_CARE_PROVIDER_SITE_OTHER): Payer: 59 | Admitting: Physician Assistant

## 2018-10-20 ENCOUNTER — Encounter: Payer: Self-pay | Admitting: Physician Assistant

## 2018-10-20 VITALS — BP 142/80 | HR 81 | Temp 98.5°F | Ht 63.0 in | Wt 242.0 lb

## 2018-10-20 DIAGNOSIS — R7303 Prediabetes: Secondary | ICD-10-CM | POA: Diagnosis not present

## 2018-10-20 DIAGNOSIS — R3589 Other polyuria: Secondary | ICD-10-CM

## 2018-10-20 DIAGNOSIS — I10 Essential (primary) hypertension: Secondary | ICD-10-CM | POA: Diagnosis not present

## 2018-10-20 DIAGNOSIS — R358 Other polyuria: Secondary | ICD-10-CM

## 2018-10-20 LAB — BASIC METABOLIC PANEL
BUN: 13 mg/dL (ref 6–23)
CO2: 27 mEq/L (ref 19–32)
Calcium: 9.7 mg/dL (ref 8.4–10.5)
Chloride: 108 mEq/L (ref 96–112)
Creatinine, Ser: 0.86 mg/dL (ref 0.40–1.20)
GFR: 87.73 mL/min (ref >60.00)
Glucose, Bld: 104 mg/dL — ABNORMAL HIGH (ref 70–99)
Potassium: 4 mEq/L (ref 3.5–5.1)
Sodium: 143 mEq/L (ref 135–145)

## 2018-10-20 LAB — URINALYSIS, ROUTINE W REFLEX MICROSCOPIC
Bilirubin Urine: NEGATIVE
Hgb urine dipstick: NEGATIVE
Ketones, ur: NEGATIVE
Leukocytes, UA: NEGATIVE
Nitrite: NEGATIVE
RBC / HPF: NONE SEEN (ref 0–?)
Specific Gravity, Urine: 1.01 (ref 1.000–1.030)
Total Protein, Urine: NEGATIVE
Urine Glucose: NEGATIVE
Urobilinogen, UA: 0.2 (ref 0.0–1.0)
pH: 5.5 (ref 5.0–8.0)

## 2018-10-20 LAB — HEMOGLOBIN A1C: Hgb A1c MFr Bld: 5.6 % (ref 4.6–6.5)

## 2018-10-20 MED ORDER — PRAVASTATIN SODIUM 40 MG PO TABS: 40.0000 mg | ORAL_TABLET | Freq: Every day | ORAL | 0 refills | Status: DC

## 2018-10-20 MED ORDER — FUROSEMIDE 40 MG PO TABS: 60.0000 mg | ORAL_TABLET | Freq: Every day | ORAL | 2 refills | Status: DC

## 2018-10-20 MED ORDER — POTASSIUM CHLORIDE CRYS ER 10 MEQ PO TBCR: 20.0000 meq | EXTENDED_RELEASE_TABLET | Freq: Every day | ORAL | 0 refills | Status: DC

## 2018-10-20 MED ORDER — AMLODIPINE BESYLATE 10 MG PO TABS: 10.0000 mg | ORAL_TABLET | Freq: Every day | ORAL | 3 refills | Status: DC

## 2018-10-20 MED ORDER — LOSARTAN POTASSIUM 100 MG PO TABS: 100.0000 mg | ORAL_TABLET | Freq: Every day | ORAL | 0 refills | Status: DC

## 2018-10-20 MED ORDER — CARVEDILOL 25 MG PO TABS: 25.0000 mg | ORAL_TABLET | Freq: Two times a day (BID) | ORAL | 3 refills | Status: DC

## 2018-10-20 NOTE — Patient Instructions (Signed)
It was great to see you!  Return at your convenience for a nutrition visit.  Please call neurology if your headaches get worse.  Take care,  Inda Coke PA-C

## 2018-10-20 NOTE — Progress Notes (Signed)
Tammy Avery is a 57 y.o. female is here to discuss: Hypertension  I acted as a Education administrator for [employer], PA-C Anselmo Pickler, LPN  History of Present Illness:   Chief Complaint  Patient presents with  . Hypertension    Hypertension  This is a chronic problem. The current episode started more than 1 year ago (Pt here for a follow up for blood pressure. Pt checking Bp once a week since here last Bp ranging high 846'N to 629'B Systolic and 28'U-13'K Dystolic. Pt has been off her Losartan x 3 weeks or Pravastatin.). The problem is uncontrolled. Associated symptoms include headaches. Pertinent negatives include no blurred vision, chest pain, palpitations, peripheral edema or shortness of breath. Agents associated with hypertension include thyroid hormones. Risk factors for coronary artery disease include dyslipidemia, obesity and post-menopausal state. The current treatment provides mild improvement.   Currently taking Amlodopine 79m,  Coreg 271mBID,  Lasix 6028mLosartan 100m63mily.  She has been without her pravastatin and losartan for about 2 to 3 weeks.  She states that she has been on all of her other medications and has been compliant with these.  Patient also reports that she is having some urinary frequency as we enter visit today.  She denies poly polyphagia or polydipsia.  She denies any odor.  There are no preventive care reminders to display for this patient.  Past Medical History:  Diagnosis Date  . Anemia   . C. difficile diarrhea    2015 -- caught from mother  . Carpal tunnel syndrome   . Chronic migraine   . Congenital cataract    left eye, blind in left eye  . Connective tissue disease (HCC)[place]. Diverticulitis    distal colon -- confrimed on CT on 01/16/16 and colonoscopy on 08/29/15  . H/O hiatal hernia    no meds - no problems  . Headache(784.0)    cannot tolerate many NSAIDs, would do injection of depomedrol in office  . HLA B27 (HLA B27 positive)   .  Hyperlipidemia   . Hypertensive retinopathy   . Hypothyroidism    well controlled  . Kidney stones   . Memory loss    associated with peudotumor cerebri  . Obesity, morbid (HCC)[place]. Osteoarthritis   . 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 Prediabetes   . Pseudotumor cerebri syndrome    on lasix  . Radiculopathy   . Seasonal allergies   . Seizures (HCC)[place]  on meds - last one 10/18/11- meds increased -- thinks of soma  . Sinusitis   . Sleep apnea    Does use CPAP  . Spondylarthritis   . Transient alteration of awareness   . Uterine fibroid   . Vitamin D deficiency      Social History   Socioeconomic History  . Marital status: Single    Spouse name: Not on file  . Number of children: Not on file  . Years of education: Not on file  . Highest education level: Not on file  Occupational History  . Not on file  Social Needs  . Financial resource strain: Not on file  . Food insecurity:    Worry: Not on file    Inability: Not on file  . Transportation needs:    Medical: Not on file    Non-medical: Not on file  Tobacco Use  . Smoking status: Never Smoker  . Smokeless tobacco: Never Used  Substance and Sexual  Activity  . Alcohol use: Yes    Alcohol/week: 1.0 standard drinks    Types: 1 Glasses of wine per week  . Drug use: No  . Sexual activity: Never    Birth control/protection: None  Lifestyle  . Physical activity:    Days per week: Not on file    Minutes per session: Not on file  . Stress: Not on file  Relationships  . Social connections:    Talks on phone: Not on file    Gets together: Not on file    Attends religious service: Not on file    Active member of club or organization: Not on file    Attends meetings of clubs or organizations: Not on file    Relationship status: Not on file  . Intimate partner violence:    Fear of current or ex partner: Not on file    Emotionally abused: Not on file    Physically abused: Not on file    Forced sexual activity:  Not on file  Other Topics Concern  . 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 BACK SURGERY  09/28/2007   Degenerative Disc Disease - surgery on  L3/4, L4/5  . EYE SURGERY  1975   left eye cataract surgery  . IUD REMOVAL  01/09/2012   Procedure: INTRAUTERINE DEVICE (IUD) REMOVAL;  Surgeon: Marvene Staff, MD;  Location: [facility];  Service: Gynecology;  Laterality: N/A;  . OVARIAN CYST REMOVAL  01/09/2012   Procedure: OVARIAN CYSTECTOMY;  Surgeon: Marvene Staff, MD;  Location: [facility];  Service: Gynecology;  Laterality: Right;  . right knee arthroscopic  09/24/2006  . TONSILLECTOMY    . VESICOVAGINAL FISTULA CLOSURE W/ TAH     for fibroids    Family History  Problem Relation Age of Onset  . Hypertension Mother   . Heart disease Mother   . Diabetes Mother   . Lupus Mother   . Dementia Mother   . CAD Father   . Heart disease Father   . Hypertension Brother   . Diabetes Brother   . Hypertension Sister   . Breast cancer Sister   . Heart disease Maternal Grandmother   . CVA Maternal Grandmother   . Colon cancer Neg Hx     PMHx, SurgHx, SocialHx, FamHx, Medications, and Allergies were reviewed in the Visit Navigator and updated as appropriate.   Patient Active Problem List   Diagnosis Date Noted  . 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 Seizures ([place]) 04/30/2017  . Pseudotumor cerebri syndrome 04/30/2017  . Hypothyroidism 04/30/2017  . Chronic pain of left knee 12/09/2016  . Atypical chest pain 08/10/2015  . Essential hypertension 12/27/2013  . Hyperlipidemia 12/27/2013  . Morbid obesity ([place]) 12/27/2013  . Left anterior fascicular block 12/27/2013  . Prediabetes 12/27/2013    Social History   Tobacco Use  . Smoking status: Never Smoker  . Smokeless tobacco: Never Used  Substance Use Topics  . Alcohol use: Yes      Alcohol/week: 1.0 standard drinks    Types: 1 Glasses of wine per week  . Drug use: No    Current Medications and Allergies:    Current Outpatient Medications:  .  acetaminophen (TYLENOL) 500 MG tablet, Take 500 mg by mouth every 6 (six) hours as needed. Patient used this medication for  pain., Disp: , Rfl:  .  amLODipine (NORVASC) 10 MG tablet, Take 1 tablet (10 mg total) by mouth daily., Disp: 90 tablet, Rfl: 3 .  Biotin (RA BIOTIN) 2.5 MG CAPS, Take 1 capsule by mouth daily., Disp: , Rfl:  .  Calcium Carbonate (CALCIUM 600 PO), Take 2 tablets by mouth daily., Disp: , Rfl:  .  carvedilol (COREG) 25 MG tablet, Take 1 tablet (25 mg total) by mouth 2 (two) times daily., Disp: 180 tablet, Rfl: 3 .  cetirizine (ZYRTEC) 10 MG tablet, Take 1 tablet (10 mg total) by mouth daily., Disp: 30 tablet, Rfl: 11 .  Cholecalciferol (VITAMIN D-3) 5000 units TABS, Takes one tablet daily., Disp: 30 tablet, Rfl:  .  Clobetasol & Clobetasol Emul 0.05 & 0.05 % MISC, Apply 1 application topically 2 (two) times daily as needed. , Disp: , Rfl:  .  CRANBERRY PO, Take 2 capsules by mouth 3 (three) times daily. , Disp: , Rfl:  .  diclofenac sodium (VOLTAREN) 1 % GEL, Apply 2 g topically 4 (four) times daily., Disp: , Rfl:  .  furosemide (LASIX) 40 MG tablet, Take 1.5 tablets (60 mg total) by mouth daily., Disp: 45 tablet, Rfl: 2 .  gabapentin (NEURONTIN) 300 MG capsule, TAKE 1 CAPSULE BY MOUTH AT BEDTIME, Disp: 90 capsule, Rfl: 0 .  hydroxychloroquine (PLAQUENIL) 200 MG tablet, Take 200 mg by mouth 2 (two) times daily., Disp: , Rfl:  .  levothyroxine (SYNTHROID, LEVOTHROID) 75 MCG tablet, TAKE 1 TABLET(75 MCG) BY MOUTH DAILY, Disp: 90 tablet, Rfl: 1 .  Multiple Vitamin (MULTIVITAMIN) tablet, Take 2 tablets by mouth daily., Disp: , Rfl:  .  Omega-3 Fatty Acids (FISH OIL) 1000 MG CAPS, Take 1 capsule by mouth daily. , Disp: , Rfl:  .  potassium chloride (K-DUR,KLOR-CON) 10 MEQ tablet, Take 2 tablets (20 mEq total) by  mouth daily., Disp: 180 tablet, Rfl: 0 .  Pyridoxine HCl (VITAMIN B6) 200 MG TABS, Take one tablet daily., Disp: 30 tablet, Rfl:  .  zonisamide (ZONEGRAN) 100 MG capsule, Take 400 mg by mouth daily. , Disp: , Rfl:  .  losartan (COZAAR) 100 MG tablet, Take 1 tablet (100 mg total) by mouth daily., Disp: 90 tablet, Rfl: 0 .  pravastatin (PRAVACHOL) 40 MG tablet, Take 1 tablet (40 mg total) by mouth daily., Disp: 90 tablet, Rfl: 0   Allergies  Allergen Reactions  . Advil [Ibuprofen]     Rapid heartbeat  . Aleve [Naproxen Sodium]     dizziness nausea  . Benadryl [Diphenhydramine] Hives and Other (See Comments)    fatigue  . Doxycycline Other (See Comments)    Kidney stones  . Lactose Intolerance (Gi) Diarrhea  . Limbrel [Flavocoxid]     Diarrhea    . Mobic [Meloxicam]     vomiting   . Neosporin [Neomycin-Bacitracin Zn-Polymyx]     Causes infection  . Soma [Carisoprodol] Other (See Comments)    seizures  . Codeine Rash    Patient states that Codeine in liquid form causes her rash reaction.  But other forms of codeine are okay for patient to take.  . Latex Rash  . Morphine And Related Itching and Rash  . Penicillins Rash    Review of Systems   Review of Systems  Eyes: Negative for blurred vision.  Respiratory: Negative for shortness of breath.   Cardiovascular: Negative for chest pain and palpitations.  Neurological: Positive for headaches.    Vitals:   Vitals:   10/20/18 6378  BP: (!) 142/80  Pulse: 81  Temp: 98.5 F (36.9 C)  TempSrc: Oral  SpO2: 98%  Weight: 242 lb (109.8 kg)  Height: _0  (1.6 m)     Body mass index is 42.87 kg/m.   Physical Exam:    Physical Exam  Constitutional: She appears well-developed. She is cooperative.  Non-toxic appearance. She does not have a sickly appearance. She does not appear ill. No distress.  Cardiovascular: Normal rate, regular rhythm, S1 normal, S2 normal, normal heart sounds and normal pulses.  No LE edema    Pulmonary/Chest: Effort normal and breath sounds normal.  Neurological: She is alert. GCS eye subscore is 4. GCS verbal subscore is 5. GCS motor subscore is 6.  Skin: Skin is warm, dry and intact.  Psychiatric: She has a normal mood and affect. Her speech is normal and behavior is normal.  Nursing note and vitals reviewed.     Assessment and Plan:    Meili was seen today for hypertension.  Diagnoses and all orders for this visit:  Essential hypertension Recheck labs today.  Uncontrolled but I do feel as though it will be controlled when she resumes her losartan.  Recheck blood pressure at home and keep [place] posted.  I refilled all of her blood pressure medicines today.  Follow-up in 3 months, sooner if issues. -     Basic metabolic panel  Polyuria Unsure etiology.  She suspects that it could be because of her diuretic.  I will check a urinalysis and also recheck a hemoglobin A1c.  Follow-up if symptoms persist. -     Urinalysis, Routine w reflex microscopic  Prediabetes -     Hemoglobin A1c  Other orders -     pravastatin (PRAVACHOL) 40 MG tablet; Take 1 tablet (40 mg total) by mouth daily. -     potassium chloride (K-DUR,KLOR-CON) 10 MEQ tablet; Take 2 tablets (20 mEq total) by mouth daily. -     losartan (COZAAR) 100 MG tablet; Take 1 tablet (100 mg total) by mouth daily. -     furosemide (LASIX) 40 MG tablet; Take 1.5 tablets (60 mg total) by mouth daily. -     carvedilol (COREG) 25 MG tablet; Take 1 tablet (25 mg total) by mouth 2 (two) times daily. -     amLODipine (NORVASC) 10 MG tablet; Take 1 tablet (10 mg total) by mouth daily.   . Reviewed expectations re: course of current medical issues. . Discussed self-management of symptoms. . Outlined signs and symptoms indicating need for more acute intervention. . Patient verbalized understanding and all questions were answered. . See orders for this visit as documented in the electronic medical record. . Patient received an  After Visit Summary.  CMA or LPN served as scribe during this visit. History, Physical, and Plan performed by medical provider. The above documentation has been reviewed and is accurate and complete.  Inda Coke, PA-C , [place] [facility] 10/20/2018  Follow-up: Return in about 2 weeks (around 11/03/2018) for nutrition.

## 2018-11-02 ENCOUNTER — Encounter: Payer: Self-pay | Admitting: Physician Assistant

## 2018-11-02 ENCOUNTER — Ambulatory Visit (INDEPENDENT_AMBULATORY_CARE_PROVIDER_SITE_OTHER): Payer: 59 | Admitting: Physician Assistant

## 2018-11-02 VITALS — BP 136/78 | HR 72 | Temp 98.8°F | Ht 63.0 in | Wt 241.4 lb

## 2018-11-02 DIAGNOSIS — G8929 Other chronic pain: Secondary | ICD-10-CM | POA: Diagnosis not present

## 2018-11-02 DIAGNOSIS — M25562 Pain in left knee: Secondary | ICD-10-CM | POA: Diagnosis not present

## 2018-11-02 DIAGNOSIS — Z713 Dietary counseling and surveillance: Secondary | ICD-10-CM

## 2018-11-02 NOTE — Patient Instructions (Signed)
It was great to see you!  Continue to work on getting more non-starchy vegetables in your diet and limiting your carbohydrates. You are doing great!  Take care,  Inda Coke PA-C

## 2018-11-02 NOTE — Progress Notes (Signed)
Tammy Avery is a 57 y.o. female here for a new problem  History of Present Illness:   Chief Complaint  Patient presents with  . Nutrition Counseling    HPI  Patient is here to discuss weight loss.  Dietary recall: Breakfast -- Toast (honey wheat or whole wheat), applesauce, greek yogurt (vanllla or strawberry) Lunch -- restaurant take out OR [employer] fish filet with fries Dinner -- tuna sub, chips, drink Snacks -- popcorn or potato chips Beverages  -- water  Weight: Wt Readings from Last 3 Encounters:  11/02/18 241 lb 6.1 oz (109.5 kg)  10/20/18 242 lb (109.8 kg)  07/01/18 235 lb (106.6 kg)    Exercise: No regular exercise. Hx of knee pain and requiring injections. Overdue for knee injection and has appointment with ortho in a few weeks for this.  Support system: Sister  Sleep: Working with the neurologist on her shift sleep  Goals: 1- understand food groups 2- incorporate exercises that will help with weight loss 3- choose more balanced foods  Estimated daily energy needs: Calories: 1600-1750 kcal Protein: 60-70 g Fluid: 2000 ml  Past Medical History:  Diagnosis Date  . Anemia   . C. difficile diarrhea    2015 -- caught from mother  . Carpal tunnel syndrome   . Chronic migraine   . Congenital cataract    left eye, blind in left eye  . Connective tissue disease ([place])   . Diverticulitis    distal colon -- confrimed on CT on 01/16/16 and colonoscopy on 08/29/15  . H/O hiatal hernia    no meds - no problems  . Headache(784.0)    cannot tolerate many NSAIDs, would do injection of depomedrol in office  . HLA B27 (HLA B27 positive)   . Hyperlipidemia   . Hypertensive retinopathy   . Hypothyroidism    well controlled  . Kidney stones   . Memory loss    associated with peudotumor cerebri  . Obesity, morbid ([place])   . Osteoarthritis   . Polyarthritis, 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[place])   . Prediabetes   . Pseudotumor cerebri syndrome    on lasix  . Radiculopathy    . Seasonal allergies   . Seizures ([place])     on meds - last one 10/18/11- meds increased -- thinks of soma  . Sinusitis   . Sleep apnea    Does use CPAP  . Spondylarthritis   . Transient alteration of awareness   . Uterine fibroid   . Vitamin D deficiency      Social History   Socioeconomic History  . Marital status: Single    Spouse name: Not on file  . Number of children: Not on file  . Years of education: Not on file  . Highest education level: Not on file  Occupational History  . Not on file  Social Needs  . Financial resource strain: Not on file  . Food insecurity:    Worry: Not on file    Inability: Not on file  . Transportation needs:    Medical: Not on file    Non-medical: Not on file  Tobacco Use  . Smoking status: Never Smoker  . Smokeless tobacco: Never Used  Substance and Sexual Activity  . Alcohol use: Yes    Alcohol/week: 1.0 standard drinks    Types: 1 Glasses of wine per week  . Drug use: No  . Sexual activity: Never    Birth control/protection: None  Lifestyle  . Physical activity:  Days per week: Not on file    Minutes per session: Not on file  . Stress: Not on file  Relationships  . Social connections:    Talks on phone: Not on file    Gets together: Not on file    Attends religious service: Not on file    Active member of club or organization: Not on file    Attends meetings of clubs or organizations: Not on file    Relationship status: Not on file  . Intimate partner violence:    Fear of current or ex partner: Not on file    Emotionally abused: Not on file    Physically abused: Not on file    Forced sexual activity: Not on file  Other Topics Concern  . 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 BACK SURGERY  09/28/2007   Degenerative Disc Disease - surgery on  L3/4, L4/5  .  EYE SURGERY  1975   left eye cataract surgery  . IUD REMOVAL  01/09/2012   Procedure: INTRAUTERINE DEVICE (IUD) REMOVAL;  Surgeon: Marvene Staff, MD;  Location: [facility];  Service: Gynecology;  Laterality: N/A;  . OVARIAN CYST REMOVAL  01/09/2012   Procedure: OVARIAN CYSTECTOMY;  Surgeon: Marvene Staff, MD;  Location: [facility];  Service: Gynecology;  Laterality: Right;  . right knee arthroscopic  09/24/2006  . TONSILLECTOMY    . VESICOVAGINAL FISTULA CLOSURE W/ TAH     for fibroids    Family History  Problem Relation Age of Onset  . Hypertension Mother   . Heart disease Mother   . Diabetes Mother   . Lupus Mother   . Dementia Mother   . CAD Father   . Heart disease Father   . Hypertension Brother   . Diabetes Brother   . Hypertension Sister   . Breast cancer Sister   . Heart disease Maternal Grandmother   . CVA Maternal Grandmother   . Colon cancer Neg Hx     Allergies  Allergen Reactions  . Advil [Ibuprofen]     Rapid heartbeat  . Aleve [Naproxen Sodium]     dizziness nausea  . Benadryl [Diphenhydramine] Hives and Other (See Comments)    fatigue  . Doxycycline Other (See Comments)    Kidney stones  . Lactose Intolerance (Gi) Diarrhea  . Limbrel [Flavocoxid]     Diarrhea    . Mobic [Meloxicam]     vomiting   . Neosporin [Neomycin-Bacitracin Zn-Polymyx]     Causes infection  . Soma [Carisoprodol] Other (See Comments)    seizures  . Codeine Rash    Patient states that Codeine in liquid form causes her rash reaction.  But other forms of codeine are okay for patient to take.  . Latex Rash  . Morphine And Related Itching and Rash  . Penicillins Rash    Current Medications:   Current Outpatient Medications:  .  acetaminophen (TYLENOL) 500 MG tablet, Take 500 mg by mouth every 6 (six) hours as needed. Patient used this medication for pain., Disp: , Rfl:  .  amLODipine (NORVASC) 10 MG tablet, Take 1 tablet (10 mg total) by mouth daily., Disp: 90 tablet,  Rfl: 3 .  Biotin (RA BIOTIN) 2.5 MG CAPS, Take 1 capsule by mouth daily., Disp: , Rfl:  .  Calcium  Carbonate (CALCIUM 600 PO), Take 2 tablets by mouth daily., Disp: , Rfl:  .  carvedilol (COREG) 25 MG tablet, Take 1 tablet (25 mg total) by mouth 2 (two) times daily., Disp: 180 tablet, Rfl: 3 .  cetirizine (ZYRTEC) 10 MG tablet, Take 1 tablet (10 mg total) by mouth daily., Disp: 30 tablet, Rfl: 11 .  Cholecalciferol (VITAMIN D-3) 5000 units TABS, Takes one tablet daily., Disp: 30 tablet, Rfl:  .  Clobetasol & Clobetasol Emul 0.05 & 0.05 % MISC, Apply 1 application topically 2 (two) times daily as needed. , Disp: , Rfl:  .  CRANBERRY PO, Take 2 capsules by mouth 3 (three) times daily. , Disp: , Rfl:  .  diclofenac sodium (VOLTAREN) 1 % GEL, Apply 2 g topically 4 (four) times daily., Disp: , Rfl:  .  furosemide (LASIX) 40 MG tablet, Take 1.5 tablets (60 mg total) by mouth daily., Disp: 45 tablet, Rfl: 2 .  gabapentin (NEURONTIN) 300 MG capsule, TAKE 1 CAPSULE BY MOUTH AT BEDTIME, Disp: 90 capsule, Rfl: 0 .  hydroxychloroquine (PLAQUENIL) 200 MG tablet, Take 200 mg by mouth 2 (two) times daily., Disp: , Rfl:  .  levothyroxine (SYNTHROID, LEVOTHROID) 75 MCG tablet, TAKE 1 TABLET(75 MCG) BY MOUTH DAILY, Disp: 90 tablet, Rfl: 1 .  losartan (COZAAR) 100 MG tablet, Take 1 tablet (100 mg total) by mouth daily., Disp: 90 tablet, Rfl: 0 .  Multiple Vitamin (MULTIVITAMIN) tablet, Take 2 tablets by mouth daily., Disp: , Rfl:  .  Omega-3 Fatty Acids (FISH OIL) 1000 MG CAPS, Take 1 capsule by mouth daily. , Disp: , Rfl:  .  potassium chloride (K-DUR,KLOR-CON) 10 MEQ tablet, Take 2 tablets (20 mEq total) by mouth daily., Disp: 180 tablet, Rfl: 0 .  pravastatin (PRAVACHOL) 40 MG tablet, Take 1 tablet (40 mg total) by mouth daily., Disp: 90 tablet, Rfl: 0 .  Pyridoxine HCl (VITAMIN B6) 200 MG TABS, Take one tablet daily., Disp: 30 tablet, Rfl:  .  zonisamide (ZONEGRAN) 100 MG capsule, Take 400 mg by mouth daily. ,  Disp: , Rfl:    Review of Systems:   ROS  Negative unless otherwise specified per HPI.  Vitals:   Vitals:   11/02/18 0853  BP: 136/78  Pulse: 72  Temp: 98.8 F (37.1 C)  TempSrc: Oral  Weight: 241 lb 6.1 oz (109.5 kg)  Height: 5' 3"  (1.6 m)     Body mass index is 42.76 kg/m.  Physical Exam:   Physical Exam Constitutional:      Appearance: She is well-developed.  HENT:     Head: Normocephalic and atraumatic.  Eyes:     Conjunctiva/sclera: Conjunctivae normal.  Neck:     Musculoskeletal: Normal range of motion and neck supple.  Pulmonary:     Effort: Pulmonary effort is normal.  Musculoskeletal: Normal range of motion.  Skin:    General: Skin is warm and dry.  Neurological:     Mental Status: She is alert and oriented to person, place, and time.  Psychiatric:        Behavior: Behavior normal.        Thought Content: Thought content normal.        Judgment: Judgment normal.       Assessment and Plan:    Kayelee was seen today for nutrition counseling.  Diagnoses and all orders for this visit:  Encounter for nutritional counseling; Morbid obesity ([place]) Discussed specific, individualized recommendations regarding nutrition including decreasing sugary beverages, limiting portions, balancing out  meals, and eating regularly throughout the day. Handouts provided included: Balanced Plate, Balanced Snack List, Balanced Breakfast, 1800 Calorie Sample Menus. Provided emotional support and encouraged slow, steady weight loss. Patient's questions answered throughout encounter. Follow-up with me prn.  Chronic pain of left knee Paulla Fore for knee injections or other intervention per his discretion. -     Ambulatory referral to Sports Medicine   . Reviewed expectations re: course of current medical issues. . Discussed self-management of symptoms. . Outlined signs and symptoms indicating need for more acute intervention. . Patient verbalized understanding and all questions  were answered. . See orders for this visit as documented in the electronic medical record. . Patient received an After-Visit Summary.   Inda Coke, PA-C

## 2018-11-09 ENCOUNTER — Ambulatory Visit
Admission: RE | Admit: 2018-11-09 | Discharge: 2018-11-09 | Disposition: A | Discharge status: Home / Self Care | Payer: 59 | Source: Ambulatory Visit | Attending: Neurosurgery | Admitting: Neurosurgery

## 2018-11-09 DIAGNOSIS — M5442 Lumbago with sciatica, left side: Principal | ICD-10-CM

## 2018-11-09 DIAGNOSIS — M5441 Lumbago with sciatica, right side: Principal | ICD-10-CM

## 2018-11-09 DIAGNOSIS — G8929 Other chronic pain: Secondary | ICD-10-CM

## 2018-11-09 MED ORDER — GADOBENATE DIMEGLUMINE 529 MG/ML IV SOLN
20.0000 mL | Freq: Once | INTRAVENOUS | Status: AC | PRN
  Administered 2018-11-09: 20 mL via INTRAVENOUS

## 2018-11-10 ENCOUNTER — Ambulatory Visit (INDEPENDENT_AMBULATORY_CARE_PROVIDER_SITE_OTHER): Payer: 59 | Admitting: Sports Medicine

## 2018-11-10 ENCOUNTER — Ambulatory Visit: Payer: Self-pay

## 2018-11-10 ENCOUNTER — Ambulatory Visit (INDEPENDENT_AMBULATORY_CARE_PROVIDER_SITE_OTHER): Payer: 59 | Admitting: Orthopaedic Surgery

## 2018-11-10 ENCOUNTER — Encounter: Payer: Self-pay | Admitting: Sports Medicine

## 2018-11-10 ENCOUNTER — Ambulatory Visit (INDEPENDENT_AMBULATORY_CARE_PROVIDER_SITE_OTHER): Payer: 59

## 2018-11-10 VITALS — BP 130/92 | HR 86 | Ht 63.0 in | Wt 240.8 lb

## 2018-11-10 DIAGNOSIS — M47819 Spondylosis without myelopathy or radiculopathy, site unspecified: Secondary | ICD-10-CM

## 2018-11-10 DIAGNOSIS — G8929 Other chronic pain: Secondary | ICD-10-CM | POA: Diagnosis not present

## 2018-11-10 DIAGNOSIS — M25562 Pain in left knee: Secondary | ICD-10-CM

## 2018-11-10 DIAGNOSIS — R590 Localized enlarged lymph nodes: Secondary | ICD-10-CM | POA: Insufficient documentation

## 2018-11-10 NOTE — Patient Instructions (Signed)

## 2018-11-10 NOTE — Assessment & Plan Note (Signed)
Ultrasound-guided injection performed today.  Moderate amount of synovitis appreciated.

## 2018-11-10 NOTE — Progress Notes (Signed)
PROCEDURE NOTE:  Ultrasound Guided: Injection: Left knee Images were obtained and interpreted by myself, Teresa Coombs, DO  Images have been saved and stored to PACS system. Images obtained on: GE S7 Ultrasound machine    ULTRASOUND FINDINGS:  Moderate synovitis with small effusion.  DESCRIPTION OF PROCEDURE:  The patient's clinical condition is marked by substantial pain and/or significant functional disability. Other conservative therapy has not provided relief, is contraindicated, or not appropriate. There is a reasonable likelihood that injection will significantly improve the patient's pain and/or functional impairment.   After discussing the risks, benefits and expected outcomes of the injection and all questions were reviewed and answered, the patient wished to undergo the above named procedure.  Verbal consent was obtained.  The ultrasound was used to identify the target structure and adjacent neurovascular structures. The skin was then prepped in sterile fashion and the target structure was injected under direct visualization using sterile technique as below:  Single injection performed as below: PREP: Alcohol and Ethel Chloride APPROACH:superiolateral, single injection, 21g 2 in. INJECTATE: 2 cc 0.5% Marcaine and 2 cc 40mg /mL DepoMedrol ASPIRATE: None DRESSING: Band-Aid  Post procedural instructions including recommending icing and warning signs for infection were reviewed.    This procedure was well tolerated and there were no complications.   IMPRESSION: Succesful Ultrasound Guided: Injection

## 2018-11-10 NOTE — Assessment & Plan Note (Signed)
Patient was called by Dr. Arnoldo Morale prior to yesterday but was unable to reach the patient.  I did discuss the results of her MRI with her and with Chuck Hint her PCP.  I will defer to Sam and Dr. Arnoldo Morale regarding the work-up for the lymphadenopathy.  Of note she is on Plaquenil which does have some association with this finding however obvious only further evaluation to rule out other Malignant possibilities should be pursued.  She is up-to-date on her mammogram and her colonoscopy.

## 2018-11-10 NOTE — Assessment & Plan Note (Signed)
Being followed by critical rheumatology.  She is on Plaquenil which does have some association with adenopathy however in talking with Len Blalock further evaluation with CT abdomen pelvis will be pursued.  I will defer to her further management of this.  She has recently undergone a normal mammography in September of this year.

## 2018-11-10 NOTE — Progress Notes (Signed)
Tammy Avery. [name], [place] at [facility] [phone]  Tammy Avery - 57 y.o. female MRN [id number]  Date of birth: [birth year]  Visit Date:11/10/2018   PCP: Inda Coke, PA   Referred by: Inda Coke, [place]   SUBJECTIVE:  Chief Complaint  Patient presents with  . Initial Assessment    Referred by Inda Coke, PA  . L knee pain    HPI: "Tammy Avery" presents today for evaluation of left knee pain.  She has had issues with her left knee intermittently over the past several years.  She reports occasional posterior pain in the left knee.  Is worsened with cold weather.  Improves with rest heat and ice.  She has had an injection with Dr. Ninfa Linden in June 2018 with good improvement.  She uses topical analgesics with some improvement in her symptoms.  Compression has been helpful as well as Aleve.  REVIEW OF SYSTEMS: Denies night time disturbances. Denies fevers, chills, or night sweats. Denies unexplained weight loss. Denies personal history of cancer. Denies changes in bowel or bladder habits. Denies recent unreported falls. Denies new or worsening dyspnea or wheezing. Reports occasional headaches, no dizziness.  Denies numbness, tingling or weakness  In the extremities.  Denies dizziness or presyncopal episodes Reports, Occasional lower extremity edema   HISTORY:  Prior history reviewed and updated per electronic medical record.  Social History   Occupational History  . Not on file  Tobacco Use  . Smoking status: Never Smoker  . Smokeless tobacco: Never Used  Substance and Sexual Activity  . Alcohol use: Yes    Alcohol/week: 1.0 standard drinks    Types: 1 Glasses of wine per week  . Drug use: No  . Sexual activity: Never    Birth control/protection: None   Social History   Social History Narrative   Psychologist, 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 football ([employer], Doctor, general practice, Engineer, manufacturing systems)    Past  Medical History:  Diagnosis Date  . Anemia   . C. difficile diarrhea    2015 -- caught from mother  . Carpal tunnel syndrome   . Chronic migraine   . Congenital cataract    left eye, blind in left eye  . Connective tissue disease ([place])   . Diverticulitis    distal colon -- confrimed on CT on 01/16/16 and colonoscopy on 08/29/15  . H/O hiatal hernia    no meds - no problems  . Headache(784.0)    cannot tolerate many NSAIDs, would do injection of depomedrol in office  . HLA B27 (HLA B27 positive)   . Hyperlipidemia   . Hypertensive retinopathy   . Hypothyroidism    well controlled  . Kidney stones   . Memory loss    associated with peudotumor cerebri  . Obesity, morbid ([place])   . Osteoarthritis   . 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 Prediabetes   . Pseudotumor cerebri syndrome    on lasix  . Radiculopathy   . Seasonal allergies   . Seizures ([place])     on meds - last one 10/18/11- meds increased -- thinks of soma  . Sinusitis   . Sleep apnea    Does use CPAP  . Spondylarthritis   . Transient alteration of awareness   . Uterine fibroid   . Vitamin D deficiency    Past Surgical History:  Procedure Laterality Date  . BACK SURGERY  09/28/2007   Degenerative Disc  Disease - surgery on  L3/4, L4/5  . EYE SURGERY  1975   left eye cataract surgery  . IUD REMOVAL  01/09/2012   Procedure: INTRAUTERINE DEVICE (IUD) REMOVAL;  Surgeon: Marvene Staff, MD;  Location: [facility];  Service: Gynecology;  Laterality: N/A;  . OVARIAN CYST REMOVAL  01/09/2012   Procedure: OVARIAN CYSTECTOMY;  Surgeon: Marvene Staff, MD;  Location: [facility];  Service: Gynecology;  Laterality: Right;  . right knee arthroscopic  09/24/2006  . TONSILLECTOMY    . VESICOVAGINAL FISTULA CLOSURE W/ TAH     for fibroids   family history includes Breast cancer in her sister; CAD in her father; CVA in her maternal grandmother; Dementia in her mother; Diabetes in her brother and mother; Heart disease in her  father, maternal grandmother, and mother; Hypertension in her brother, mother, and sister; Lupus in her mother. There is no history of Colon cancer.  DATA OBTAINED & REVIEWED:  Recent Labs    02/04/18 04/14/18 1352 07/01/18 1329 10/20/18 0922  HGBA1C  --  5.6  --  5.6  CALCIUM  --   --  9.7 9.7  AST 23  --  21  --   ALT 26  --  21  --   TSH  --  2.06  --   --    Problem  Lymphadenopathy, Abdominal  Spondylarthritis   HLA-B27 positive -- followed by [name], PA-C with [facility]  MRI Lumbar Spine: 11/02/18 1. Prominent retroperitoneal adenopathy of the abdomen. This raises high concern for malignancy. Recommend CT of the abdomen and pelvis for further evaluation. ???Prostate metastases should also be considered??? 2. Previous lumbar fusion at L3-4 and L4-5 without residual recurrent stenosis at these levels. 3. Leftward disc protrusion and asymmetric facet disease at L5-S1 leads to mild left subarticular narrowing and bilateral foraminal stenosis, left greater than right. 4. Adjacent level disease at L2-3 with mild central and bilateral foraminal narrowing, right greater than left. 5. Broad-based disc protrusion and moderate facet hypertrophy at L5 to leads to mild bilateral foraminal narrowing, right greater than left. 6. Cholelithiasis without evidence for cholecystitis.  These results will be called to the ordering clinician or representative by the Radiologist Assistant, and communication documented in the PACS or zVision Dashboard.    Chronic Pain of Left Knee   11/10/2018: X-rays left knee: Lateral tilt of the patella with moderate degenerative changes tricompartmental.    The 10-year ASCVD risk score Tammy Avery DC Jr., et al., 2013) is: 6%   Values used to calculate the score:     Age: 83 years     Sex: Female     Is Non-Hispanic African American: Yes     Diabetic: No     Tobacco smoker: No     Systolic Blood Pressure: 503 mmHg     Is BP treated: Yes     HDL  Cholesterol: 55.7 mg/dL     Total Cholesterol: 181 mg/dL  OBJECTIVE:  VS:  HT:5' 3"  (160 cm)   WT:240 lb 12.8 oz (109.2 kg)  BMI:42.67    BP:(!) 130/92  HR:86bpm  TEMP: ( )  RESP:98 %   PHYSICAL EXAM: CONSTITUTIONAL: Well-developed, Well-nourished and In no acute distress PSYCHIATRIC : Alert & appropriately interactive. and Not depressed or anxious appearing. RESPIRATORY : No increased work of breathing and Trachea Midline EYES : Pupils are equal., EOM intact without nystagmus. and No scleral icterus.  VASCULAR EXAM : Warm and well perfused NEURO: unremarkable  MSK  Exam:  Left knee  No significant deformity. No overlying skin changes TTP over: Medial and lateral joint lines more so over the medial. Mild swelling Mild effusion Moderate synovitis Ligamentously stable to varus and valgus strain and anterior/posterior drawer.  Pain with McMurray's. Slight lateral tilt of the patella on x-rays obtained today.  Mild degenerative changes otherwise.    ASSESSMENT  1. Chronic pain of left knee   2. Spondylarthritis   3. Lymphadenopathy, abdominal     PLAN:  Pertinent additional documentation may be included in corresponding procedure notes, imaging studies, problem based documentation and patient instructions.  Procedures:  US Guided Injection per procedure note  Medications:  No orders of the defined types were placed in this encounter.   Discussion/Instructions: Spondylarthritis Being followed by critical rheumatology.  She is on Plaquenil which does have some association with adenopathy however in talking with Len Blalock further evaluation with CT abdomen pelvis will be pursued.  I will defer to her further management of this.  She has recently undergone a normal mammography in September of this year.  Lymphadenopathy, abdominal Patient was called by Dr. Arnoldo Morale prior to yesterday but was unable to reach the patient.  I did discuss the results of her MRI with her and  with Chuck Hint her PCP.  I will defer to Sam and Dr. Arnoldo Morale regarding the work-up for the lymphadenopathy.  Of note she is on Plaquenil which does have some association with this finding however obvious only further evaluation to rule out other Malignant possibilities should be pursued.  She is up-to-date on her mammogram and her colonoscopy.   Chronic pain of left knee Ultrasound-guided injection performed today.  Moderate amount of synovitis appreciated. RICE (Rest, ICE, Compression, Elevation) principles reviewed with the patient. Discussed red flag symptoms that warrant earlier emergent evaluation and patient voices understanding. Activity modifications and the importance of avoiding exacerbating activities (limiting pain to no more than a 4 / 10 during or following activity) recommended and discussed. >50% of this 45 minutes minute visit spent in direct patient counseling and/or coordination of care. Discussion was focused on education regarding the in discussing the pathoetiology and anticipated clinical course of the above condition.  If any lack of improvement: consider further diagnostic evaluation with MRI of the knee   At follow up will plan: to consider repeat corticosteroid injections and to consider Visco-supplementation And or Zilretta Return in about 6 weeks (around 12/22/2018).          Gerda Diss, [place] [facility] Physician

## 2018-11-12 ENCOUNTER — Ambulatory Visit: Payer: 59 | Admitting: Sports Medicine

## 2018-11-17 ENCOUNTER — Other Ambulatory Visit: Payer: Self-pay | Admitting: Neurosurgery

## 2018-11-17 DIAGNOSIS — R590 Localized enlarged lymph nodes: Secondary | ICD-10-CM

## 2018-11-19 ENCOUNTER — Ambulatory Visit (INDEPENDENT_AMBULATORY_CARE_PROVIDER_SITE_OTHER): Payer: 59 | Admitting: Orthopaedic Surgery

## 2018-11-23 ENCOUNTER — Ambulatory Visit
Admission: RE | Admit: 2018-11-23 | Discharge: 2018-11-23 | Disposition: A | Discharge status: Home / Self Care | Payer: 59 | Source: Ambulatory Visit | Attending: Neurosurgery | Admitting: Neurosurgery

## 2018-11-23 DIAGNOSIS — R590 Localized enlarged lymph nodes: Secondary | ICD-10-CM

## 2018-11-23 MED ORDER — IOPAMIDOL (ISOVUE-300) INJECTION 61%
125.0000 mL | Freq: Once | INTRAVENOUS | Status: AC | PRN
  Administered 2018-11-23: 125 mL via INTRAVENOUS

## 2018-12-01 ENCOUNTER — Other Ambulatory Visit: Payer: Self-pay | Admitting: Physician Assistant

## 2018-12-03 ENCOUNTER — Other Ambulatory Visit: Payer: Self-pay | Admitting: Neurosurgery

## 2018-12-03 DIAGNOSIS — E278 Other specified disorders of adrenal gland: Secondary | ICD-10-CM

## 2018-12-04 ENCOUNTER — Ambulatory Visit
Admission: RE | Admit: 2018-12-04 | Discharge: 2018-12-04 | Disposition: A | Discharge status: Home / Self Care | Payer: 59 | Source: Ambulatory Visit | Attending: Neurosurgery | Admitting: Neurosurgery

## 2018-12-04 DIAGNOSIS — E278 Other specified disorders of adrenal gland: Secondary | ICD-10-CM

## 2018-12-04 MED ORDER — GADOBENATE DIMEGLUMINE 529 MG/ML IV SOLN
20.0000 mL | Freq: Once | INTRAVENOUS | Status: AC | PRN
  Administered 2018-12-04: 20 mL via INTRAVENOUS

## 2018-12-21 ENCOUNTER — Ambulatory Visit: Payer: 59 | Admitting: Sports Medicine

## 2019-01-04 ENCOUNTER — Ambulatory Visit (INDEPENDENT_AMBULATORY_CARE_PROVIDER_SITE_OTHER): Payer: 59 | Admitting: Sports Medicine

## 2019-01-04 ENCOUNTER — Encounter: Payer: Self-pay | Admitting: Sports Medicine

## 2019-01-04 ENCOUNTER — Ambulatory Visit: Payer: Self-pay

## 2019-01-04 VITALS — BP 122/80 | HR 78 | Ht 63.0 in | Wt 239.4 lb

## 2019-01-04 DIAGNOSIS — M1712 Unilateral primary osteoarthritis, left knee: Secondary | ICD-10-CM

## 2019-01-04 DIAGNOSIS — M25562 Pain in left knee: Secondary | ICD-10-CM | POA: Diagnosis not present

## 2019-01-04 DIAGNOSIS — G8929 Other chronic pain: Secondary | ICD-10-CM

## 2019-01-04 NOTE — Progress Notes (Signed)
Tammy Avery. Tammy Avery, [place] at [place] - 58 y.o. female MRN [id number]  Date of birth: [birth year]  Visit Date: January 04, 2019  PCP: Inda Coke, [place]   Referred by: Inda Coke, [place]  SUBJECTIVE:  Chief Complaint  Patient presents with  . Left Knee - Follow-up    XR L knee 11/10/18. Corticosteroid inj 11/10/18. Has tried Aleve, topical analgesic, compression.     HPI: Patient is here for ongoing left knee pain.  She responded well to the last injection is repeat questing repeat injection today.  Symptoms have returned over the past several days.  She is having pain within the posterior aspect of the knee and describes as a stiffness.  She is having occasional swelling.  She has been using topical pain relieving rub.  Using compression as well as previously fabricated orthotics.  REVIEW OF SYSTEMS: No significant nighttime awakenings due to this issue. Denies fevers, chills, recent weight gain or weight loss.  No night sweats.  Pt denies any change in bowel or bladder habits, muscle weakness, numbness or falls associated with this pain.  HISTORY:  Prior history reviewed and updated per electronic medical record.  Patient Active Problem List   Diagnosis Date Noted  . Lymphadenopathy, abdominal 11/10/2018    Initially found on CT abd pelvis 11/23/18. Follow-up MR of abd on 12/04/18 shows "benign adrenal adenoma"   . Spondylarthritis 04/30/2017    HLA-B27 positive -- followed by [name], PA-C with [facility] Rhe[facility]  MRI Lumbar Spine: 11/02/18 1. Prominent retroperitoneal adenopathy of the abdomen. This raises high concern for malignancy. Recommend CT of the abdomen and pelvis for further evaluation. ???Prostate metastases should also be considered??? 2. Previous lumbar fusion at L3-4 and L4-5 without residual recurrent stenosis at these levels. 3. Leftward disc protrusion and asymmetric facet disease  at L5-S1 leads to mild left subarticular narrowing and bilateral foraminal stenosis, left greater than right. 4. Adjacent level disease at L2-3 with mild central and bilateral foraminal narrowing, right greater than left. 5. Broad-based disc protrusion and moderate facet hypertrophy at L5 to leads to mild bilateral foraminal narrowing, right greater than left. 6. Cholelithiasis without evidence for cholecystitis.  These results will be called to the ordering clinician or representative by the Radiologist Assistant, and communication documented in the PACS or zVision Dashboard.    . Seizures ([place]) 04/30/2017     on meds - last one 10/18/11- meds increased -- thinks of soma   . Pseudotumor cerebri syndrome 04/30/2017    on lasix   . Hypothyroidism 04/30/2017    well controlled   . Chronic pain of left knee 12/09/2016    11/10/2018: X-rays left knee: Lateral tilt of the patella with moderate degenerative changes tricompartmental.  Corticosteroid injection 11/10/2018, 04/2017   . Atypical chest pain 08/10/2015  . Essential hypertension 12/27/2013  . Hyperlipidemia 12/27/2013  . Morbid obesity ([place]) 12/27/2013  . Left anterior fascicular block 12/27/2013  . Prediabetes 12/27/2013   Social History   Occupational History  . Not on file  Tobacco Use  . Smoking status: Never Smoker  . Smokeless tobacco: Never Used  Substance and Sexual Activity  . Alcohol use: Yes    Alcohol/week: 1.0 standard drinks    Types: 1 Glasses of wine per week  . Drug use: No  . Sexual activity: Never    Birth control/protection: None   Social History   Social History Narrative  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Clerk   Single   No children   Fun: fantasy football ([employer], Doctor, general practice, Engineer, manufacturing systems)    OBJECTIVE:  VS:  HT:5' 3"  (160 cm)   WT:239 lb 6.4 oz (108.6 kg)  BMI:42.42    BP:122/80  HR:78bpm  TEMP: ( )  RESP:96 %   PHYSICAL EXAM: Adult female. No acute distress.  Alert and appropriate. Left knee has  moderate amount of swelling.  She has a non-tense effusion.  Positive Baker's cyst.  Pain over the medial and lateral joint lines.   ASSESSMENT:  1. Chronic pain of left knee   2. Primary osteoarthritis of left knee     PROCEDURES:  US Guided Injection per procedure note      PLAN:  Pertinent additional documentation may be included in corresponding procedure notes, imaging studies, problem based documentation and patient instructions.  No problem-specific Assessment & Plan notes found for this encounter.   Repeat injection of the knee performed today.  She will benefit from longer acting Zilretta injections going forward and she will be preapproved for this today.  If any lack of improvement or worsening symptoms total knee arthroplasty could be considered.  Continue previously prescribed home exercise program.   Activity modifications and the importance of avoiding exacerbating activities (limiting pain to no more than a 4 / 10 during or following activity) recommended and discussed.  Discussed red flag symptoms that warrant earlier emergent evaluation and patient voices understanding.   No orders of the defined types were placed in this encounter.  Lab Orders  No laboratory test(s) ordered today    Imaging Orders     [place] [facility] POCT ULTRASOUND Referral Orders  No referral(s) requested today    Return in about 10 weeks (around 03/15/2019).          Gerda Diss, [place] [facility] Physician

## 2019-01-04 NOTE — Patient Instructions (Addendum)

## 2019-01-04 NOTE — Procedures (Signed)
PROCEDURE NOTE:  Ultrasound Guided: Injection: Left knee Images were obtained and interpreted by myself, Teresa Coombs, DO  Images have been saved and stored to PACS system. Images obtained on: GE S7 Ultrasound machine    ULTRASOUND FINDINGS:  Moderate effusion with generalized marked synovitis.  She has a small Baker's cyst.  Tricompartmental degenerative changes.  DESCRIPTION OF PROCEDURE:  The patient's clinical condition is marked by substantial pain and/or significant functional disability. Other conservative therapy has not provided relief, is contraindicated, or not appropriate. There is a reasonable likelihood that injection will significantly improve the patient's pain and/or functional impairment.   After discussing the risks, benefits and expected outcomes of the injection and all questions were reviewed and answered, the patient wished to undergo the above named procedure.  Verbal consent was obtained.  The ultrasound was used to identify the target structure and adjacent neurovascular structures. The skin was then prepped in sterile fashion and the target structure was injected under direct visualization using sterile technique as below:  Single injection performed as below: PREP: Alcohol and Ethel Chloride APPROACH:superiolateral, single injection, 21g 2 in. INJECTATE: 2 cc 0.5% Marcaine and 2 cc 40mg /mL DepoMedrol ASPIRATE: None DRESSING: Band-Aid  Post procedural instructions including recommending icing and warning signs for infection were reviewed.    This procedure was well tolerated and there were no complications.   IMPRESSION: Succesful Ultrasound Guided: Injection

## 2019-01-18 ENCOUNTER — Encounter: Payer: Self-pay | Admitting: Physician Assistant

## 2019-01-18 ENCOUNTER — Ambulatory Visit (INDEPENDENT_AMBULATORY_CARE_PROVIDER_SITE_OTHER): Payer: 59 | Admitting: Physician Assistant

## 2019-01-18 ENCOUNTER — Telehealth: Payer: Self-pay | Admitting: *Deleted

## 2019-01-18 VITALS — BP 160/88 | HR 77 | Temp 98.8°F | Ht 63.0 in | Wt 239.0 lb

## 2019-01-18 DIAGNOSIS — N281 Cyst of kidney, acquired: Secondary | ICD-10-CM | POA: Diagnosis not present

## 2019-01-18 DIAGNOSIS — D3502 Benign neoplasm of left adrenal gland: Secondary | ICD-10-CM

## 2019-01-18 DIAGNOSIS — R590 Localized enlarged lymph nodes: Secondary | ICD-10-CM

## 2019-01-18 NOTE — Patient Instructions (Signed)
It was great to see you!  You will be contacted about your oncology referral.  Take care,  Inda Coke PA-C

## 2019-01-18 NOTE — Telephone Encounter (Signed)
Left message on voicemail to call office.  

## 2019-01-18 NOTE — Telephone Encounter (Signed)
Pt called back asked her who is prescribing her Plaquenil? Pt said [facility] Rheumatology Dr. Trudie Reed. Asked her if she had a follow up appt with her? Pt said yes end of March. Told pt due to Rheumatology prescribing your medication for your back pain [employer] will not be able to fill out FMLA paperwork cause Dr. Trudie Reed is the one treating your back pain. Pt verbalized understanding and said she will need the top page of the FMLA papers. Told pt okay I will put at the front desk for you to pick up.Pt verbalized understanding.

## 2019-01-18 NOTE — Progress Notes (Signed)
Tammy Avery is a 58 y.o. female here for a follow up of a pre-existing problem.   History of Present Illness:   Chief Complaint  Patient presents with  . MRI f/u    HPI   MRI follow-up on 12/04/2018 -->  "On image 72/15 there is a 0.6 by 0.5 cm focus of arterial phase enhancement with low conspicuity on all other sequences, probably a small area of focal nodular hyperplasia although technically nonspecific due to its small size."  IMPRESSION: 1. The left adrenal mass represents a benign adrenal adenoma. 2. Cholelithiasis. 3. Small Bosniak category 2 cyst of the left kidney upper pole, benign  She had an MRI for an adrenal lesion follow-up and is here to review this today.  Started a pesco-vegetarian diet about 6-8 months ago. Has RUQ pain that radiates to her back occasionally.  Feels like it is associated with lifting.  She also states that the pain has decreased now that she is on a lower fat diet.  The pain is variable. The pain comes a few times a week. Denies: nausea    Past Medical History:  Diagnosis Date  . Anemia   . C. difficile diarrhea    2015 -- caught from mother  . Carpal tunnel syndrome   . Chronic migraine   . Congenital cataract    left eye, blind in left eye  . Connective tissue disease ([place])   . Diverticulitis    distal colon -- confrimed on CT on 01/16/16 and colonoscopy on 08/29/15  . H/O hiatal hernia    no meds - no problems  . Headache(784.0)    cannot tolerate many NSAIDs, would do injection of depomedrol in office  . HLA B27 (HLA B27 positive)   . Hyperlipidemia   . Hypertensive retinopathy   . Hypothyroidism    well controlled  . Kidney stones   . Memory loss    associated with peudotumor cerebri  . Obesity, morbid ([place])   . Osteoarthritis   . 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 Prediabetes   . Pseudotumor cerebri syndrome    on lasix  . Radiculopathy   . Seasonal allergies   . Seizures ([place])     on meds - last one  10/18/11- meds increased -- thinks of soma  . Sinusitis   . Sleep apnea    Does use CPAP  . Spondylarthritis   . Transient alteration of awareness   . Uterine fibroid   . Vitamin D deficiency      Social History   Socioeconomic History  . Marital status: Single    Spouse name: Not on file  . Number of children: Not on file  . Years of education: Not on file  . Highest education level: Not on file  Occupational History  . Not on file  Social Needs  . Financial resource strain: Not on file  . Food insecurity:    Worry: Not on file    Inability: Not on file  . Transportation needs:    Medical: Not on file    Non-medical: Not on file  Tobacco Use  . Smoking status: Never Smoker  . Smokeless tobacco: Never Used  Substance and Sexual Activity  . Alcohol use: Yes    Alcohol/week: 1.0 standard drinks    Types: 1 Glasses of wine per week  . Drug use: No  . Sexual activity: Never    Birth control/protection: None  Lifestyle  . Physical activity:    Days  per week: Not on file    Minutes per session: Not on file  . Stress: Not on file  Relationships  . Social connections:    Talks on phone: Not on file    Gets together: Not on file    Attends religious service: Not on file    Active member of club or organization: Not on file    Attends meetings of clubs or organizations: Not on file    Relationship status: Not on file  . Intimate partner violence:    Fear of current or ex partner: Not on file    Emotionally abused: Not on file    Physically abused: Not on file    Forced sexual activity: Not on file  Other Topics Concern  . Not on file  Social History Narrative   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[place]   Single   No children   Fun: fantasy football ([place], [place], Engineer, manufacturing systems)    Past Surgical History:  Procedure Laterality Date  . BACK SURGERY  09/28/2007   Degenerative Disc Disease - surgery on  L3/4, L4/5  . EYE SURGERY  1975   left eye cataract surgery  . IUD REMOVAL   01/09/2012   Procedure: INTRAUTERINE DEVICE (IUD) REMOVAL;  Surgeon: Marvene Staff, MD;  Location: [facility];  Service: Gynecology;  Laterality: N/A;  . OVARIAN CYST REMOVAL  01/09/2012   Procedure: OVARIAN CYSTECTOMY;  Surgeon: Marvene Staff, MD;  Location: [facility];  Service: Gynecology;  Laterality: Right;  . right knee arthroscopic  09/24/2006  . TONSILLECTOMY    . VESICOVAGINAL FISTULA CLOSURE W/ TAH     for fibroids    Family History  Problem Relation Age of Onset  . Hypertension Mother   . Heart disease Mother   . Diabetes Mother   . Lupus Mother   . Dementia Mother   . CAD Father   . Heart disease Father   . Hypertension Brother   . Diabetes Brother   . Hypertension Sister   . Breast cancer Sister   . Heart disease Maternal Grandmother   . CVA Maternal Grandmother   . Colon cancer Neg Hx     Allergies  Allergen Reactions  . Advil [Ibuprofen]     Rapid heartbeat  . Aleve [Naproxen Sodium]     dizziness nausea  . Benadryl [Diphenhydramine] Hives and Other (See Comments)    fatigue  . Doxycycline Other (See Comments)    Kidney stones  . Lactose Intolerance (Gi) Diarrhea  . Limbrel [Flavocoxid]     Diarrhea    . Mobic [Meloxicam]     vomiting   . Neosporin [Neomycin-Bacitracin Zn-Polymyx]     Causes infection  . Soma [Carisoprodol] Other (See Comments)    seizures  . Codeine Rash    Patient states that Codeine in liquid form causes her rash reaction.  But other forms of codeine are okay for patient to take.  . Latex Rash  . Morphine And Related Itching and Rash  . Penicillins Rash    Current Medications:   Current Outpatient Medications:  .  acetaminophen (TYLENOL) 500 MG tablet, Take 500 mg by mouth every 6 (six) hours as needed. Patient used this medication for pain., Disp: , Rfl:  .  amLODipine (NORVASC) 10 MG tablet, Take 1 tablet (10 mg total) by mouth daily., Disp: 90 tablet, Rfl: 3 .  Biotin (RA BIOTIN) 2.5 MG CAPS, Take 1 capsule by  mouth daily., Disp: , Rfl:  .  Calcium Carbonate (  CALCIUM 600 PO), Take 2 tablets by mouth daily., Disp: , Rfl:  .  carvedilol (COREG) 25 MG tablet, Take 1 tablet (25 mg total) by mouth 2 (two) times daily., Disp: 180 tablet, Rfl: 3 .  cetirizine (ZYRTEC) 10 MG tablet, Take 1 tablet (10 mg total) by mouth daily., Disp: 30 tablet, Rfl: 11 .  Cholecalciferol (VITAMIN D-3) 5000 units TABS, Takes one tablet daily., Disp: 30 tablet, Rfl:  .  Clobetasol & Clobetasol Emul 0.05 & 0.05 % MISC, Apply 1 application topically 2 (two) times daily as needed. , Disp: , Rfl:  .  CRANBERRY PO, Take 2 capsules by mouth 3 (three) times daily. , Disp: , Rfl:  .  diclofenac sodium (VOLTAREN) 1 % GEL, Apply 2 g topically 4 (four) times daily., Disp: , Rfl:  .  furosemide (LASIX) 40 MG tablet, Take 1.5 tablets (60 mg total) by mouth daily., Disp: 45 tablet, Rfl: 2 .  gabapentin (NEURONTIN) 300 MG capsule, TAKE 1 CAPSULE BY MOUTH AT BEDTIME, Disp: 90 capsule, Rfl: 0 .  hydroxychloroquine (PLAQUENIL) 200 MG tablet, Take 200 mg by mouth 2 (two) times daily., Disp: , Rfl:  .  levothyroxine (SYNTHROID, LEVOTHROID) 75 MCG tablet, TAKE 1 TABLET(75 MCG) BY MOUTH DAILY, Disp: 90 tablet, Rfl: 1 .  losartan (COZAAR) 100 MG tablet, TAKE 1 TABLET(100 MG) BY MOUTH DAILY, Disp: 90 tablet, Rfl: 0 .  Multiple Vitamin (MULTIVITAMIN) tablet, Take 2 tablets by mouth daily., Disp: , Rfl:  .  Omega-3 Fatty Acids (FISH OIL) 1000 MG CAPS, Take 1 capsule by mouth daily. , Disp: , Rfl:  .  potassium chloride (K-DUR,KLOR-CON) 10 MEQ tablet, TAKE 2 TABLETS(20 MEQ) BY MOUTH DAILY, Disp: 180 tablet, Rfl: 0 .  pravastatin (PRAVACHOL) 40 MG tablet, TAKE 1 TABLET(40 MG) BY MOUTH DAILY, Disp: 90 tablet, Rfl: 0 .  Pyridoxine HCl (VITAMIN B6) 200 MG TABS, Take one tablet daily., Disp: 30 tablet, Rfl:  .  zonisamide (ZONEGRAN) 100 MG capsule, Take 400 mg by mouth daily. , Disp: , Rfl:    Review of Systems:   Review of Systems  Constitutional: Negative  for chills, fever, malaise/fatigue and weight loss.  Respiratory: Negative for shortness of breath.   Cardiovascular: Negative for chest pain, orthopnea, claudication and leg swelling.  Gastrointestinal: Negative for heartburn, nausea and vomiting.  Neurological: Negative for dizziness, tingling and headaches.      Vitals:   Vitals:   01/18/19 0835  BP: (!) 160/88  Pulse: 77  Temp: 98.8 F (37.1 C)  TempSrc: Oral  SpO2: 98%  Weight: 239 lb (108.4 kg)  Height: 5' 3"  (1.6 m)     Body mass index is 42.34 kg/m.  Physical Exam:   Physical Exam Vitals signs and nursing note reviewed.  Constitutional:      General: She is not in acute distress.    Appearance: She is well-developed. She is not ill-appearing or toxic-appearing.  Cardiovascular:     Rate and Rhythm: Normal rate and regular rhythm.     Pulses: Normal pulses.     Heart sounds: Normal heart sounds, S1 normal and S2 normal.     Comments: No LE edema Pulmonary:     Effort: Pulmonary effort is normal.     Breath sounds: Normal breath sounds.  Skin:    General: Skin is warm and dry.  Neurological:     Mental Status: She is alert.     GCS: GCS eye subscore is 4. GCS verbal subscore is 5. GCS  motor subscore is 6.  Psychiatric:        Speech: Speech normal.        Behavior: Behavior normal. Behavior is cooperative.      Assessment and Plan:   Katelyn was seen today for mri f/u.  Diagnoses and all orders for this visit:  Lymphadenopathy, abdominal -     Ambulatory referral to Hematology / Oncology  Adenoma of left adrenal gland -     Ambulatory referral to Hematology / Oncology  Cyst of left kidney -     Ambulatory referral to Hematology / Oncology   Reviewed MRI results with patient today.  I would like to refer her to oncology to weigh in on this and to help [place] decide how frequently we need to reassess and get further imaging.  Patient is agreeable to plan.  . Reviewed expectations re: course of  current medical issues. . Discussed self-management of symptoms. . Outlined signs and symptoms indicating need for more acute intervention. . Patient verbalized understanding and all questions were answered. . See orders for this visit as documented in the electronic medical record. . Patient received an After-Visit Summary.     Inda Coke, PA-C

## 2019-03-01 ENCOUNTER — Other Ambulatory Visit: Payer: Self-pay | Admitting: Physician Assistant

## 2019-03-05 ENCOUNTER — Other Ambulatory Visit: Payer: Self-pay | Admitting: Physician Assistant

## 2019-03-15 ENCOUNTER — Ambulatory Visit: Payer: 59 | Admitting: Sports Medicine

## 2019-03-19 ENCOUNTER — Ambulatory Visit: Payer: 59 | Admitting: Sports Medicine

## 2019-03-22 ENCOUNTER — Encounter: Payer: Self-pay | Admitting: Physician Assistant

## 2019-03-22 ENCOUNTER — Ambulatory Visit (INDEPENDENT_AMBULATORY_CARE_PROVIDER_SITE_OTHER): Payer: 59 | Admitting: Physician Assistant

## 2019-03-22 VITALS — BP 127/74 | Ht 63.0 in | Wt 235.0 lb

## 2019-03-22 DIAGNOSIS — I10 Essential (primary) hypertension: Secondary | ICD-10-CM | POA: Diagnosis not present

## 2019-03-22 DIAGNOSIS — R070 Pain in throat: Secondary | ICD-10-CM | POA: Diagnosis not present

## 2019-03-22 DIAGNOSIS — K8020 Calculus of gallbladder without cholecystitis without obstruction: Secondary | ICD-10-CM

## 2019-03-22 NOTE — Progress Notes (Signed)
Virtual Visit via Video   I connected with Tammy Avery on 03/22/19 at  9:00 AM EDT by a video enabled telemedicine application and verified that I am speaking with the correct person using two identifiers. Location patient: Home Location provider: [facility], Office Persons participating in the virtual visit: CAITLIN AINLEY, Inda Coke, [place]   I discussed the limitations of evaluation and management by telemedicine and the availability of in person appointments. The patient expressed understanding and agreed to proceed.  Subjective:   HPI:   Hypertension Pt following up today. Pt checks blood pressure at home regularly. She is currently on: Norvasc 10 mg, Losartan 100 mg, Lasix 60 mg daily. The average has been in the 120's/70's. Had one time high of 145/90 and a low of 102/59. Pt presently taking Amlodipine and Losartan. Pt denies headaches, dizziness, blurred vision, SOB, chest pain or LLE. Pt has been having slight headaches when it is going to rain and when the rain starts they go away.  Throat discomfort She states that she is having intermittent throat discomfort, wonders if her "thyroid is acting up again". She also has been having some intermittent hot flashes over the past week. Would like her thyroid labs checked.  Cholelithiasis/Back pain She has documented cholelithiasis from MRI of abdomen on 12/04/2018 and she states that she is now having daily abdominal and back pain and is interested in getting her gallbladder out.  ROS: See pertinent positives and negatives per HPI.  Patient Active Problem List   Diagnosis Date Noted  . Lymphadenopathy, abdominal 11/10/2018  . 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 Seizures ([place]) 04/30/2017  . Pseudotumor cerebri syndrome 04/30/2017  . Hypothyroidism 04/30/2017  . Chronic pain of left knee 12/09/2016  . Atypical chest pain 08/10/2015  . Essential hypertension 12/27/2013  . Hyperlipidemia 12/27/2013  . Morbid obesity ([place])  12/27/2013  . Left anterior fascicular block 12/27/2013  . Prediabetes 12/27/2013    Social History   Tobacco Use  . Smoking status: Never Smoker  . Smokeless tobacco: Never Used  Substance Use Topics  . Alcohol use: Yes    Alcohol/week: 1.0 standard drinks    Types: 1 Glasses of wine per week    Current Outpatient Medications:  .  acetaminophen (TYLENOL) 500 MG tablet, Take 500 mg by mouth every 6 (six) hours as needed. Patient used this medication for pain., Disp: , Rfl:  .  amLODipine (NORVASC) 10 MG tablet, Take 1 tablet (10 mg total) by mouth daily., Disp: 90 tablet, Rfl: 3 .  Biotin (RA BIOTIN) 2.5 MG CAPS, Take 1 capsule by mouth daily., Disp: , Rfl:  .  Calcium Carbonate (CALCIUM 600 PO), Take 2 tablets by mouth daily., Disp: , Rfl:  .  carvedilol (COREG) 25 MG tablet, Take 1 tablet (25 mg total) by mouth 2 (two) times daily., Disp: 180 tablet, Rfl: 3 .  cetirizine (ZYRTEC) 10 MG tablet, Take 1 tablet (10 mg total) by mouth daily., Disp: 30 tablet, Rfl: 11 .  Cholecalciferol (VITAMIN D-3) 5000 units TABS, Takes one tablet daily., Disp: 30 tablet, Rfl:  .  Clobetasol & Clobetasol Emul 0.05 & 0.05 % MISC, Apply 1 application topically 2 (two) times daily as needed. , Disp: , Rfl:  .  CRANBERRY PO, Take 2 capsules by mouth 3 (three) times daily. , Disp: , Rfl:  .  diclofenac sodium (VOLTAREN) 1 % GEL, Apply 2 g topically 4 (four) times daily., Disp: , Rfl:  .  furosemide (LASIX) 40  MG tablet, TAKE 1 AND 1/2 TABLETS(60 MG) BY MOUTH DAILY, Disp: 45 tablet, Rfl: 0 .  gabapentin (NEURONTIN) 300 MG capsule, TAKE 1 CAPSULE BY MOUTH AT BEDTIME, Disp: 90 capsule, Rfl: 0 .  hydroxychloroquine (PLAQUENIL) 200 MG tablet, Take 200 mg by mouth 2 (two) times daily., Disp: , Rfl:  .  levothyroxine (SYNTHROID, LEVOTHROID) 75 MCG tablet, TAKE 1 TABLET(75 MCG) BY MOUTH DAILY, Disp: 90 tablet, Rfl: 1 .  losartan (COZAAR) 100 MG tablet, TAKE 1 TABLET(100 MG) BY MOUTH DAILY, Disp: 90 tablet, Rfl: 0 .   Multiple Vitamin (MULTIVITAMIN) tablet, Take 2 tablets by mouth daily., Disp: , Rfl:  .  Omega-3 Fatty Acids (FISH OIL) 1000 MG CAPS, Take 1 capsule by mouth daily. , Disp: , Rfl:  .  potassium chloride (K-DUR,KLOR-CON) 10 MEQ tablet, TAKE 2 TABLETS(20 MEQ) BY MOUTH DAILY, Disp: 180 tablet, Rfl: 0 .  pravastatin (PRAVACHOL) 40 MG tablet, TAKE 1 TABLET(40 MG) BY MOUTH DAILY, Disp: 90 tablet, Rfl: 0 .  Probiotic Product (PROBIOTIC DAILY PO), Take 1 tablet by mouth daily., Disp: , Rfl:  .  Pyridoxine HCl (VITAMIN B6) 200 MG TABS, Take one tablet daily., Disp: 30 tablet, Rfl:  .  zonisamide (ZONEGRAN) 100 MG capsule, Take 400 mg by mouth daily. , Disp: , Rfl:   Allergies  Allergen Reactions  . Advil [Ibuprofen]     Rapid heartbeat  . Aleve [Naproxen Sodium]     dizziness nausea  . Benadryl [Diphenhydramine] Hives and Other (See Comments)    fatigue  . Doxycycline Other (See Comments)    Kidney stones  . Lactose Intolerance (Gi) Diarrhea  . Limbrel [Flavocoxid]     Diarrhea    . Mobic [Meloxicam]     vomiting   . Neosporin [Neomycin-Bacitracin Zn-Polymyx]     Causes infection  . Soma [Carisoprodol] Other (See Comments)    seizures  . Codeine Rash    Patient states that Codeine in liquid form causes her rash reaction.  But other forms of codeine are okay for patient to take.  . Latex Rash  . Morphine And Related Itching and Rash  . Penicillins Rash    Objective:   VITALS: Per patient if applicable, see vitals. GENERAL: Alert, appears well and in no acute distress. HEENT: Atraumatic, conjunctiva clear, no obvious abnormalities on inspection of external nose and ears. NECK: Normal movements of the head and neck. CARDIOPULMONARY: No increased WOB. Speaking in clear sentences. I:E ratio WNL.  MS: Moves all visible extremities without noticeable abnormality. PSYCH: Pleasant and cooperative, well-groomed. Speech normal rate and rhythm. Affect is appropriate. Insight and judgement are  appropriate. Attention is focused, linear, and appropriate.  NEURO: CN grossly intact. Oriented as arrived to appointment on time with no prompting. Moves both UE equally.  SKIN: No obvious lesions, wounds, erythema, or cyanosis noted on face or hands.  Assessment and Plan:   Tanazia was seen today for hypertension.  Diagnoses and all orders for this visit:  Essential hypertension Currently well controlled. Continue current plan. Will recheck BMP when she returns in about 1 month for labs.  Throat discomfort Will check TSH and free T4 per patient's request.  Gallstones Surgery consult. ER precautions advised.   . Reviewed expectations re: course of current medical issues. . Discussed self-management of symptoms. . Outlined signs and symptoms indicating need for more acute intervention. . Patient verbalized understanding and all questions were answered. Marland Kitchen Health Maintenance issues including appropriate healthy diet, exercise, and smoking avoidance were  discussed with patient. . See orders for this visit as documented in the electronic medical record.  I discussed the assessment and treatment plan with the patient. The patient was provided an opportunity to ask questions and all were answered. The patient agreed with the plan and demonstrated an understanding of the instructions.   The patient was advised to call back or seek an in-person evaluation if the symptoms worsen or if the condition fails to improve as anticipated.    [place], [place] 03/22/2019

## 2019-04-15 ENCOUNTER — Telehealth: Payer: Self-pay | Admitting: *Deleted

## 2019-04-15 ENCOUNTER — Telehealth: Payer: Self-pay

## 2019-04-15 NOTE — Telephone Encounter (Signed)
YOUR CARDIOLOGY TEAM HAS ARRANGED FOR AN E-VISIT FOR YOUR APPOINTMENT - PLEASE REVIEW IMPORTANT INFORMATION BELOW SEVERAL DAYS PRIOR TO YOUR APPOINTMENT  Due to the recent COVID-19 pandemic, we are transitioning in-person office visits to tele-medicine visits in an effort to decrease unnecessary exposure to our patients, their families, and staff. These visits are billed to your insurance just like a normal visit is. We also encourage you to sign up for MyChart if you have not already done so. You will need a smartphone if possible. For patients that do not have this, we can still complete the visit using a regular telephone but do prefer a smartphone to enable video when possible. You may have a family member that lives with you that can help. If possible, we also ask that you have a blood pressure cuff and scale at home to measure your blood pressure, heart rate and weight prior to your scheduled appointment. Patients with clinical needs that need an in-person evaluation and testing will still be able to come to the office if absolutely necessary. If you have any questions, feel free to call our office.     YOUR PROVIDER WILL BE USING THE FOLLOWING PLATFORM TO COMPLETE YOUR VISIT: Doxy.Me  . IF USING MYCHART - How to Download the MyChart App to Your SmartPhone   - If Apple, go to App Store and type in MyChart in the search bar and download the app. If Android, ask patient to go to Google Play Store and type in MyChart in the search bar and download the app. The app is free but as with any other app downloads, your phone may require you to verify saved payment information or Apple/Android password.  - You will need to then log into the app with your MyChart username and password, and select [facility] as your healthcare provider to link the account.  - When it is time for your visit, go to the MyChart app, find appointments, and click Begin Video Visit. Be sure to Select Allow for your device to  access the Microphone and Camera for your visit. You will then be connected, and your provider will be with you shortly.  **If you have any issues connecting or need assistance, please contact MyChart service desk [phone] ([phone])**  **If using a computer, in order to ensure the best quality for your visit, you will need to use either of the following Internet Browsers: Google Chrome or Microsoft Edge**  . IF USING DOXIMITY or [email or web address].ME - The staff will give you instructions on receiving your link to join the meeting the day of your visit.      2-3 DAYS BEFORE YOUR APPOINTMENT  You will receive a telephone call from one of our [facility] team members - your caller ID may say "Unknown caller." If this is a video visit, we will walk you through how to get the video launched on your phone. We will remind you check your blood pressure, heart rate and weight prior to your scheduled appointment. If you have an Apple Watch or Kardia, please upload any pertinent ECG strips the day before or morning of your appointment to MyChart. Our staff will also make sure you have reviewed the consent and agree to move forward with your scheduled tele-health visit.     THE DAY OF YOUR APPOINTMENT  Approximately 15 minutes prior to your scheduled appointment, you will receive a telephone call from one of [facility] team - your caller ID may say "Unknown caller."    Our staff will confirm medications, vital signs for the day and any symptoms you may be experiencing. Please have this information available prior to the time of visit start. It may also be helpful for you to have a pad of paper and pen handy for any instructions given during your visit. They will also walk you through joining the smartphone meeting if this is a video visit.    CONSENT FOR TELE-HEALTH VISIT - PLEASE REVIEW  I hereby voluntarily request, consent and authorize [facility] and its employed or contracted physicians, physician  assistants, nurse practitioners or other licensed health care professionals (the Practitioner), to provide me with telemedicine health care services (the "Services") as deemed necessary by the treating Practitioner. I acknowledge and consent to receive the Services by the Practitioner via telemedicine. I understand that the telemedicine visit will involve communicating with the Practitioner through live audiovisual communication technology and the disclosure of certain medical information by electronic transmission. I acknowledge that I have been given the opportunity to request an in-person assessment or other available alternative prior to the telemedicine visit and am voluntarily participating in the telemedicine visit.  I understand that I have the right to withhold or withdraw my consent to the use of telemedicine in the course of my care at any time, without affecting my right to future care or treatment, and that the Practitioner or I may terminate the telemedicine visit at any time. I understand that I have the right to inspect all information obtained and/or recorded in the course of the telemedicine visit and may receive copies of available information for a reasonable fee.  I understand that some of the potential risks of receiving the Services via telemedicine include:  . Delay or interruption in medical evaluation due to technological equipment failure or disruption; . Information transmitted may not be sufficient (e.g. poor resolution of images) to allow for appropriate medical decision making by the Practitioner; and/or  . In rare instances, security protocols could fail, causing a breach of personal health information.  Furthermore, I acknowledge that it is my responsibility to provide information about my medical history, conditions and care that is complete and accurate to the best of my ability. I acknowledge that Practitioner's advice, recommendations, and/or decision may be based on  factors not within their control, such as incomplete or inaccurate data provided by me or distortions of diagnostic images or specimens that may result from electronic transmissions. I understand that the practice of medicine is not an exact science and that Practitioner makes no warranties or guarantees regarding treatment outcomes. I acknowledge that I will receive a copy of this consent concurrently upon execution via email to the email address I last provided but may also request a printed copy by calling the office of [facility].    I understand that my insurance will be billed for this visit.   I have read or had this consent read to me. . I understand the contents of this consent, which adequately explains the benefits and risks of the Services being provided via telemedicine.  . I have been provided ample opportunity to ask questions regarding this consent and the Services and have had my questions answered to my satisfaction. . I give my informed consent for the services to be provided through the use of telemedicine in my medical care  By participating in this telemedicine visit I agree to the above.  

## 2019-04-15 NOTE — Telephone Encounter (Signed)
Left message on voicemail to call office. Needs to schedule lab appt after 6/4 orders are in Epic.

## 2019-04-16 NOTE — Telephone Encounter (Signed)
Patient requesting vitamin D level labs be added to upcoming lab visit.

## 2019-04-19 ENCOUNTER — Encounter: Payer: Self-pay | Admitting: Cardiology

## 2019-04-19 ENCOUNTER — Other Ambulatory Visit: Payer: Self-pay

## 2019-04-19 ENCOUNTER — Telehealth (INDEPENDENT_AMBULATORY_CARE_PROVIDER_SITE_OTHER): Payer: 59 | Admitting: Cardiology

## 2019-04-19 VITALS — BP 139/80 | HR 78 | Ht 63.0 in | Wt 230.0 lb

## 2019-04-19 DIAGNOSIS — I10 Essential (primary) hypertension: Secondary | ICD-10-CM

## 2019-04-19 DIAGNOSIS — G4733 Obstructive sleep apnea (adult) (pediatric): Secondary | ICD-10-CM

## 2019-04-19 DIAGNOSIS — E785 Hyperlipidemia, unspecified: Secondary | ICD-10-CM

## 2019-04-19 NOTE — Telephone Encounter (Signed)
May use Obesity.

## 2019-04-19 NOTE — Telephone Encounter (Signed)
Lab pending, please confirm dx code, thanks!

## 2019-04-19 NOTE — Progress Notes (Signed)
Virtual Visit via Video Note   This visit type was conducted due to national recommendations for restrictions regarding the COVID-19 Pandemic (e.g. social distancing) in an effort to limit this patient's exposure and mitigate transmission in our community.  Due to her co-morbid illnesses, this patient is at least at moderate risk for complications without adequate follow up.  This format is felt to be most appropriate for this patient at this time.  All issues noted in this document were discussed and addressed.  A limited physical exam was performed with this format.  Please refer to the patient's chart for her consent to telehealth for [facility].   Date:  04/19/2019   ID:  Tammy Avery, DOB 24-May-1961, MRN [id number]  Patient Location: Home Provider Location: Home  PCP:  Inda Coke, [place]  Cardiologist:  Candee Furbish, MD  Electrophysiologist:  None   Evaluation Performed:  Follow-Up Visit  Chief Complaint: Hypertension follow-up family history of coronary disease follow-up  History of Present Illness:    Tammy Avery is a 58 y.o. female with hypertension hyperlipidemia here for follow-up.  Previously seen on 05/29/2018 by Cecilie Kicks, NP.  Premature family history of MI in mother at age 50  Nuclear stress test 2016 normal  Had some shortness of breath when walking uphill otherwise no chest pain.  Arthritis.  Blood pressure much better controlled on Norvasc losartan and Lasix.  Averages of been 120/70.  No side effects.  Has had some recent back pain and cholelithiasis from MRI in January 2020.  She is interested in cholecystectomy.  She works at the [employer].  She is a little concerned as there have been some COVID-19 positive employees.  She wears gloves.  Washes her hands frequently.  Overall she has not been having any symptoms.  Prior hemoglobin A1c 5.6, LDL 98. The patient does not have symptoms concerning for COVID-19 infection (fever, chills, cough, or new  shortness of breath).    Past Medical History:  Diagnosis Date  . Anemia   . C. difficile diarrhea    2015 -- caught from mother  . Carpal tunnel syndrome   . Chronic migraine   . Congenital cataract    left eye, blind in left eye  . Connective tissue disease ([place])   . Diverticulitis    distal colon -- confrimed on CT on 01/16/16 and colonoscopy on 08/29/15  . H/O hiatal hernia    no meds - no problems  . Headache(784.0)    cannot tolerate many NSAIDs, would do injection of depomedrol in office  . HLA B27 (HLA B27 positive)   . Hyperlipidemia   . Hypertensive retinopathy   . Hypothyroidism    well controlled  . Kidney stones   . Memory loss    associated with peudotumor cerebri  . Obesity, morbid ([place])   . Osteoarthritis   . 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 Prediabetes   . Pseudotumor cerebri syndrome    on lasix  . Radiculopathy   . Seasonal allergies   . Seizures ([place])     on meds - last one 10/18/11- meds increased -- thinks of soma  . Sinusitis   . Sleep apnea    Does use CPAP  . Spondylarthritis   . Transient alteration of awareness   . Uterine fibroid   . Vitamin D deficiency    Past Surgical History:  Procedure Laterality Date  . BACK SURGERY  09/28/2007   Degenerative Disc Disease - surgery on  L3/4, L4/5  . EYE SURGERY  1975   left eye cataract surgery  . IUD REMOVAL  01/09/2012   Procedure: INTRAUTERINE DEVICE (IUD) REMOVAL;  Surgeon: Marvene Staff, MD;  Location: [facility];  Service: Gynecology;  Laterality: N/A;  . OVARIAN CYST REMOVAL  01/09/2012   Procedure: OVARIAN CYSTECTOMY;  Surgeon: Marvene Staff, MD;  Location: [facility];  Service: Gynecology;  Laterality: Right;  . right knee arthroscopic  09/24/2006  . TONSILLECTOMY    . VESICOVAGINAL FISTULA CLOSURE W/ TAH     for fibroids     Current Meds  Medication Sig  . acetaminophen (TYLENOL) 500 MG tablet Take 500 mg by mouth every 6 (six) hours as needed. Patient used this  medication for pain.  Marland Kitchen amLODipine (NORVASC) 10 MG tablet Take 1 tablet (10 mg total) by mouth daily.  . Biotin (RA BIOTIN) 2.5 MG CAPS Take 1 capsule by mouth daily.  . Calcium Carbonate (CALCIUM 600 PO) Take 2 tablets by mouth daily.  . carvedilol (COREG) 25 MG tablet Take 1 tablet (25 mg total) by mouth 2 (two) times daily.  . cetirizine (ZYRTEC) 10 MG tablet Take 1 tablet (10 mg total) by mouth daily.  . Cholecalciferol (VITAMIN D-3) 5000 units TABS Takes one tablet daily.  . Clobetasol & Clobetasol Emul 0.05 & 0.05 % MISC Apply 1 application topically 2 (two) times daily as needed.   Marland Kitchen CRANBERRY PO Take 2 capsules by mouth 3 (three) times daily.   . diclofenac sodium (VOLTAREN) 1 % GEL Apply 2 g topically 4 (four) times daily.  . furosemide (LASIX) 40 MG tablet TAKE 1 AND 1/2 TABLETS(60 MG) BY MOUTH DAILY  . gabapentin (NEURONTIN) 300 MG capsule TAKE 1 CAPSULE BY MOUTH AT BEDTIME  . hydroxychloroquine (PLAQUENIL) 200 MG tablet Take 200 mg by mouth 2 (two) times daily.  Marland Kitchen levothyroxine (SYNTHROID, LEVOTHROID) 75 MCG tablet TAKE 1 TABLET(75 MCG) BY MOUTH DAILY  . losartan (COZAAR) 100 MG tablet TAKE 1 TABLET(100 MG) BY MOUTH DAILY  . Multiple Vitamin (MULTIVITAMIN) tablet Take 2 tablets by mouth daily.  . Omega-3 Fatty Acids (FISH OIL) 1000 MG CAPS Take 1 capsule by mouth daily.   . potassium chloride (K-DUR,KLOR-CON) 10 MEQ tablet TAKE 2 TABLETS(20 MEQ) BY MOUTH DAILY  . pravastatin (PRAVACHOL) 40 MG tablet TAKE 1 TABLET(40 MG) BY MOUTH DAILY  . Probiotic Product (PROBIOTIC DAILY PO) Take 1 tablet by mouth daily.  . Pyridoxine HCl (VITAMIN B6) 200 MG TABS Take one tablet daily.  Marland Kitchen zonisamide (ZONEGRAN) 100 MG capsule Take 400 mg by mouth daily.      Allergies:   Advil [ibuprofen]; Aleve [naproxen sodium]; Benadryl [diphenhydramine]; Doxycycline; Lactose intolerance (gi); Limbrel [flavocoxid]; Mobic [meloxicam]; Neosporin [neomycin-bacitracin zn-polymyx]; Soma [carisoprodol]; Codeine;  Latex; Morphine and related; and Penicillins   Social History   Tobacco Use  . Smoking status: Never Smoker  . Smokeless tobacco: Never Used  Substance Use Topics  . Alcohol use: Yes    Alcohol/week: 1.0 standard drinks    Types: 1 Glasses of wine per week  . Drug use: No     Family Hx: The patient's family history includes Breast cancer in her sister; CAD in her father; CVA in her maternal grandmother; Dementia in her mother; Diabetes in her brother and mother; Heart disease in her father, maternal grandmother, and mother; Hypertension in her brother, mother, and sister; Lupus in her mother. There is no history of Colon cancer.  ROS:   Please see the  history of present illness.    Denies of fevers chills nausea vomiting syncope bleeding All other systems reviewed and are negative.   Prior CV studies:   The following studies were reviewed today:  Studies reviewed as above  Labs/Other Tests and Data Reviewed:    EKG:  An ECG dated 04/19/19 was personally reviewed today and demonstrated:  Sinus rhythm 68 LAFB with nonspecific ST-T wave changes  Recent Labs: 07/01/2018: ALT 21; Hemoglobin 13.6; Platelets 247.0 10/20/2018: BUN 13; Creatinine, Ser 0.86; Potassium 4.0; Sodium 143   Recent Lipid Panel Lab Results  Component Value Date/Time   CHOL 208 (H) 07/01/2018 01:29 PM   TRIG 192.0 (H) 07/01/2018 01:29 PM   HDL 70.90 07/01/2018 01:29 PM   CHOLHDL 3 07/01/2018 01:29 PM   LDLCALC 98 07/01/2018 01:29 PM    Wt Readings from Last 3 Encounters:  04/19/19 230 lb (104.3 kg)  03/22/19 235 lb (106.6 kg)  01/18/19 239 lb (108.4 kg)     Objective:    Vital Signs:  BP 139/80   Pulse 78   Ht 5' 3" (1.6 m)   Wt 230 lb (104.3 kg)   LMP 12/13/2011   BMI 40.74 kg/m    VITAL SIGNS:  reviewed GEN:  no acute distress EYES:  sclerae anicteric, EOMI - Extraocular Movements Intact RESPIRATORY:  normal respiratory effort, symmetric expansion SKIN:  no rash, lesions or ulcers.  MUSCULOSKELETAL:  no obvious deformities. NEURO:  alert and oriented x 3, no obvious focal deficit PSYCH:  normal affect  ASSESSMENT & PLAN:    Essential hypertension - Much better control.  Medications reviewed as above.  Continue with weight loss.  Obstructive sleep apnea - Neurology following. Wearing mask. Doing well.  She thinks that wearing her mask is helping her.  Morbid obesity -Continue to encourage weight loss. Seafood diet.   Possible pre-op gall bladder  - OK to proceed from cardiac perspective if cholecystectomy is needed.  She would be of low cardiac risk.  COVID-19 Education: The signs and symptoms of COVID-19 were discussed with the patient and how to seek care for testing (follow up with PCP or arrange E-visit).  The importance of social distancing was discussed today.  Time:   Today, I have spent 15 minutes with the patient with telehealth technology discussing the above problems.     Medication Adjustments/Labs and Tests Ordered: Current medicines are reviewed at length with the patient today.  Concerns regarding medicines are outlined above.   Tests Ordered: No orders of the defined types were placed in this encounter.   Medication Changes: No orders of the defined types were placed in this encounter.   Disposition:  Follow up in 1 month(s)  Signed, Candee Furbish, MD  04/19/2019 8:30 AM    [facility]

## 2019-04-19 NOTE — Telephone Encounter (Signed)
Order has been placed. Called pt and advised.

## 2019-04-19 NOTE — Patient Instructions (Signed)
  Medication Instructions:  The current medical regimen is effective;  continue present plan and medications.  If you need a refill on your cardiac medications before your next appointment, please call your pharmacy.   Follow-Up: Follow up in 1 year with Dr. Skains.  You will receive a letter in the mail 2 months before you are due.  Please call us when you receive this letter to schedule your follow up appointment.  Thank you for choosing [facility]!!     

## 2019-04-20 ENCOUNTER — Other Ambulatory Visit: Payer: Self-pay | Admitting: Physician Assistant

## 2019-04-21 ENCOUNTER — Other Ambulatory Visit: Payer: Self-pay | Admitting: Physician Assistant

## 2019-04-21 DIAGNOSIS — E785 Hyperlipidemia, unspecified: Secondary | ICD-10-CM

## 2019-04-23 ENCOUNTER — Ambulatory Visit (INDEPENDENT_AMBULATORY_CARE_PROVIDER_SITE_OTHER): Payer: 59 | Admitting: Physician Assistant

## 2019-04-23 ENCOUNTER — Encounter: Payer: Self-pay | Admitting: Physician Assistant

## 2019-04-23 VITALS — BP 125/72 | HR 79 | Temp 97.5°F | Ht 63.0 in | Wt 230.0 lb

## 2019-04-23 DIAGNOSIS — R21 Rash and other nonspecific skin eruption: Secondary | ICD-10-CM | POA: Diagnosis not present

## 2019-04-23 MED ORDER — TRIAMCINOLONE ACETONIDE 0.5 % EX OINT: TOPICAL_OINTMENT | CUTANEOUS | 0 refills | Status: DC

## 2019-04-23 NOTE — Progress Notes (Signed)
Virtual Visit via Video   I connected with Tammy Avery on 04/23/19 at  3:40 PM EDT by a video enabled telemedicine application and verified that I am speaking with the correct person using two identifiers. Location patient: Home Location provider: [facility], Office Persons participating in the virtual visit: SHERNELL SALDIERNA, Inda Coke PA-C  I discussed the limitations of evaluation and management by telemedicine and the availability of in person appointments. The patient expressed understanding and agreed to proceed.  I, Tammy Avery ,acting as a Education administrator for [employer], PA.,have documented all relevant documentation on the behalf of Inda Coke, PA,as directed by  Inda Coke, PA while in the presence of Inda Coke, [place].   Subjective:   HPI:  Rash Endorses rash on left lower leg. Appeared yesterday. No pain or itching. No drainage. The rash is flat and red. Denies fever, chills, discharge from area. She has not tried anything for her rash. She denies trauma to the area.  ROS: See pertinent positives and negatives per HPI.  Patient Active Problem List   Diagnosis Date Noted  . Lymphadenopathy, abdominal 11/10/2018  . 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 Seizures ([place]) 04/30/2017  . Pseudotumor cerebri syndrome 04/30/2017  . Hypothyroidism 04/30/2017  . Chronic pain of left knee 12/09/2016  . Atypical chest pain 08/10/2015  . Essential hypertension 12/27/2013  . Hyperlipidemia 12/27/2013  . Morbid obesity ([place]) 12/27/2013  . Left anterior fascicular block 12/27/2013  . Prediabetes 12/27/2013    Social History   Tobacco Use  . Smoking status: Never Smoker  . Smokeless tobacco: Never Used  Substance Use Topics  . Alcohol use: Yes    Alcohol/week: 1.0 standard drinks    Types: 1 Glasses of wine per week    Current Outpatient Medications:  .  acetaminophen (TYLENOL) 500 MG tablet, Take 500 mg by mouth every 6 (six) hours as needed. Patient used  this medication for pain., Disp: , Rfl:  .  amLODipine (NORVASC) 10 MG tablet, Take 1 tablet (10 mg total) by mouth daily., Disp: 90 tablet, Rfl: 3 .  Biotin (RA BIOTIN) 2.5 MG CAPS, Take 1 capsule by mouth daily., Disp: , Rfl:  .  Calcium Carbonate (CALCIUM 600 PO), Take 2 tablets by mouth daily., Disp: , Rfl:  .  carvedilol (COREG) 25 MG tablet, Take 1 tablet (25 mg total) by mouth 2 (two) times daily., Disp: 180 tablet, Rfl: 3 .  cetirizine (ZYRTEC) 10 MG tablet, Take 1 tablet (10 mg total) by mouth daily., Disp: 30 tablet, Rfl: 11 .  Cholecalciferol (VITAMIN D-3) 5000 units TABS, Takes one tablet daily., Disp: 30 tablet, Rfl:  .  Clobetasol & Clobetasol Emul 0.05 & 0.05 % MISC, Apply 1 application topically 2 (two) times daily as needed. , Disp: , Rfl:  .  CRANBERRY PO, Take 2 capsules by mouth 3 (three) times daily. , Disp: , Rfl:  .  diclofenac sodium (VOLTAREN) 1 % GEL, Apply 2 g topically 4 (four) times daily., Disp: , Rfl:  .  furosemide (LASIX) 40 MG tablet, TAKE 1 AND 1/2 TABLETS(60 MG) BY MOUTH DAILY, Disp: 45 tablet, Rfl: 0 .  gabapentin (NEURONTIN) 300 MG capsule, TAKE 1 CAPSULE BY MOUTH AT BEDTIME, Disp: 90 capsule, Rfl: 0 .  hydroxychloroquine (PLAQUENIL) 200 MG tablet, Take 200 mg by mouth 2 (two) times daily., Disp: , Rfl:  .  levothyroxine (SYNTHROID, LEVOTHROID) 75 MCG tablet, TAKE 1 TABLET(75 MCG) BY MOUTH DAILY, Disp: 90 tablet, Rfl: 1 .  losartan (COZAAR) 100 MG tablet, TAKE 1 TABLET(100 MG) BY MOUTH DAILY, Disp: 30 tablet, Rfl: 0 .  Multiple Vitamin (MULTIVITAMIN) tablet, Take 2 tablets by mouth daily., Disp: , Rfl:  .  Omega-3 Fatty Acids (FISH OIL) 1000 MG CAPS, Take 1 capsule by mouth daily. , Disp: , Rfl:  .  potassium chloride (K-DUR,KLOR-CON) 10 MEQ tablet, TAKE 2 TABLETS(20 MEQ) BY MOUTH DAILY, Disp: 180 tablet, Rfl: 0 .  pravastatin (PRAVACHOL) 40 MG tablet, TAKE 1 TABLET(40 MG) BY MOUTH DAILY, Disp: 30 tablet, Rfl: 0 .  Probiotic Product (PROBIOTIC DAILY PO), Take  1 tablet by mouth daily., Disp: , Rfl:  .  Pyridoxine HCl (VITAMIN B6) 200 MG TABS, Take one tablet daily., Disp: 30 tablet, Rfl:  .  zonisamide (ZONEGRAN) 100 MG capsule, Take 400 mg by mouth daily. , Disp: , Rfl:  .  triamcinolone ointment (KENALOG) 0.5 %, Apply to affected area 1-2 x daily, Disp: 30 g, Rfl: 0  Allergies  Allergen Reactions  . Advil [Ibuprofen]     Rapid heartbeat  . Aleve [Naproxen Sodium]     dizziness nausea  . Benadryl [Diphenhydramine] Hives and Other (See Comments)    fatigue  . Doxycycline Other (See Comments)    Kidney stones  . Lactose Intolerance (Gi) Diarrhea  . Limbrel [Flavocoxid]     Diarrhea    . Mobic [Meloxicam]     vomiting   . Neosporin [Neomycin-Bacitracin Zn-Polymyx]     Causes infection  . Soma [Carisoprodol] Other (See Comments)    seizures  . Codeine Rash    Patient states that Codeine in liquid form causes her rash reaction.  But other forms of codeine are okay for patient to take.  . Latex Rash  . Morphine And Related Itching and Rash  . Penicillins Rash    Objective:   VITALS: Per patient if applicable, see vitals. GENERAL: Alert, appears well and in no acute distress. HEENT: Atraumatic, conjunctiva clear, no obvious abnormalities on inspection of external nose and ears. NECK: Normal movements of the head and neck. CARDIOPULMONARY: No increased WOB. Speaking in clear sentences. I:E ratio WNL.  MS: Moves all visible extremities without noticeable abnormality. PSYCH: Pleasant and cooperative, well-groomed. Speech normal rate and rhythm. Affect is appropriate. Insight and judgement are appropriate. Attention is focused, linear, and appropriate.  NEURO: CN grossly intact. Oriented as arrived to appointment on time with no prompting. Moves both UE equally.  SKIN: *Erythematous linear horizontal area to lower L shin; no obvious drainage or swelling  Assessment and Plan:   Airika was seen today for acute visit and rash.   Diagnoses and all orders for this visit:  Rash and nonspecific skin eruption Unclear etiology. Possible dermatitis? Will trial triamcinolone ointment. Follow-up if no improvement or worsening.  Other orders -     triamcinolone ointment (KENALOG) 0.5 %; Apply to affected area 1-2 x daily    . Reviewed expectations re: course of current medical issues. . Discussed self-management of symptoms. . Outlined signs and symptoms indicating need for more acute intervention. . Patient verbalized understanding and all questions were answered. Marland Kitchen Health Maintenance issues including appropriate healthy diet, exercise, and smoking avoidance were discussed with patient. . See orders for this visit as documented in the electronic medical record.  I discussed the assessment and treatment plan with the patient. The patient was provided an opportunity to ask questions and all were answered. The patient agreed with the plan and demonstrated an understanding of the instructions.  The patient was advised to call back or seek an in-person evaluation if the symptoms worsen or if the condition fails to improve as anticipated.   CMA or LPN served as scribe during this visit. History, Physical, and Plan performed by medical provider. The above documentation has been reviewed and is accurate and complete.  [place], [place] 04/23/2019

## 2019-04-26 ENCOUNTER — Other Ambulatory Visit (INDEPENDENT_AMBULATORY_CARE_PROVIDER_SITE_OTHER): Payer: 59

## 2019-04-26 ENCOUNTER — Other Ambulatory Visit: Payer: Self-pay

## 2019-04-26 DIAGNOSIS — R070 Pain in throat: Secondary | ICD-10-CM | POA: Diagnosis not present

## 2019-04-26 DIAGNOSIS — E785 Hyperlipidemia, unspecified: Secondary | ICD-10-CM | POA: Diagnosis not present

## 2019-04-26 DIAGNOSIS — I10 Essential (primary) hypertension: Secondary | ICD-10-CM

## 2019-04-26 LAB — TSH: TSH: 1.17 u[IU]/mL (ref 0.35–4.50)

## 2019-04-26 LAB — BASIC METABOLIC PANEL
BUN: 14 mg/dL (ref 6–23)
CO2: 28 mEq/L (ref 19–32)
Calcium: 9.3 mg/dL (ref 8.4–10.5)
Chloride: 108 mEq/L (ref 96–112)
Creatinine, Ser: 0.84 mg/dL (ref 0.40–1.20)
GFR: 84.66 mL/min (ref >60.00)
Glucose, Bld: 86 mg/dL (ref 70–99)
Potassium: 3.6 mEq/L (ref 3.5–5.1)
Sodium: 143 mEq/L (ref 135–145)

## 2019-04-26 LAB — LIPID PANEL
Cholesterol: 182 mg/dL (ref 0–200)
HDL: 73.8 mg/dL (ref >39.00)
LDL Cholesterol: 92 mg/dL (ref 0–99)
NonHDL: 108.28
Total CHOL/HDL Ratio: 2
Triglycerides: 80 mg/dL (ref 0.0–149.0)
VLDL: 16 mg/dL (ref 0.0–40.0)

## 2019-04-26 LAB — VITAMIN D 25 HYDROXY (VIT D DEFICIENCY, FRACTURES): VITD: 57.5 ng/mL (ref 30.00–100.00)

## 2019-04-27 LAB — T4: T4, Total: 9 ug/dL (ref 5.1–11.9)

## 2019-05-12 ENCOUNTER — Other Ambulatory Visit (HOSPITAL_COMMUNITY): Payer: Self-pay | Admitting: General Surgery

## 2019-05-12 ENCOUNTER — Other Ambulatory Visit: Payer: Self-pay | Admitting: General Surgery

## 2019-05-12 DIAGNOSIS — K8020 Calculus of gallbladder without cholecystitis without obstruction: Secondary | ICD-10-CM

## 2019-05-18 ENCOUNTER — Telehealth: Payer: Self-pay | Admitting: Physician Assistant

## 2019-05-18 NOTE — Telephone Encounter (Signed)
See note

## 2019-05-18 NOTE — Telephone Encounter (Signed)
Cindy from [facility] called and would like most recent lab results for medication refill. Please advise   [phone]

## 2019-05-24 NOTE — Telephone Encounter (Signed)
Labs faxed to [facility]ology as requested.

## 2019-05-25 ENCOUNTER — Other Ambulatory Visit: Payer: Self-pay | Admitting: Physician Assistant

## 2019-06-03 ENCOUNTER — Ambulatory Visit: Payer: 59 | Admitting: Family Medicine

## 2019-06-04 ENCOUNTER — Other Ambulatory Visit: Payer: Self-pay | Admitting: Physician Assistant

## 2019-06-04 ENCOUNTER — Other Ambulatory Visit: Payer: Self-pay

## 2019-06-04 DIAGNOSIS — Z20822 Contact with and (suspected) exposure to covid-19: Secondary | ICD-10-CM

## 2019-06-06 LAB — NOVEL CORONAVIRUS, NAA: SARS-CoV-2, NAA: NOT DETECTED

## 2019-06-08 ENCOUNTER — Encounter: Payer: Self-pay | Admitting: Physician Assistant

## 2019-06-08 ENCOUNTER — Telehealth: Payer: Self-pay | Admitting: Physical Therapy

## 2019-06-08 NOTE — Telephone Encounter (Signed)
Pt called back told her RTW note is done and she can access it through My Chart. Pt verbalized understanding and said she already got it.

## 2019-06-08 NOTE — Telephone Encounter (Signed)
Left message on voicemail to call office. RTW Letter is in My Chart for pt to access.

## 2019-06-08 NOTE — Telephone Encounter (Signed)
Copied from [facility] [id number]. Topic: General - Inquiry >> Jun 08, 2019  7:04 AM [name] wrote: Reason for CRM: pt states she was told to quarantine until she got her covid results from being exposed.  Pt got her negative results and needs a note stating she was told to stay home while waiting for results. (pt states one of the nursing assts is the one that told her) Pt would like put on mychart for her to access and return to work tonight.

## 2019-06-11 ENCOUNTER — Other Ambulatory Visit: Payer: Self-pay | Admitting: Physician Assistant

## 2019-06-29 ENCOUNTER — Other Ambulatory Visit: Payer: Self-pay | Admitting: Physician Assistant

## 2019-07-14 ENCOUNTER — Other Ambulatory Visit: Payer: Self-pay | Admitting: Physician Assistant

## 2019-07-23 ENCOUNTER — Other Ambulatory Visit: Payer: Self-pay

## 2019-07-23 ENCOUNTER — Encounter (HOSPITAL_COMMUNITY)
Admission: RE | Admit: 2019-07-23 | Discharge: 2019-07-23 | Disposition: A | Discharge status: Home / Self Care | Payer: 59 | Source: Ambulatory Visit | Attending: General Surgery | Admitting: General Surgery

## 2019-07-23 DIAGNOSIS — K8020 Calculus of gallbladder without cholecystitis without obstruction: Secondary | ICD-10-CM | POA: Diagnosis present

## 2019-07-23 MED ORDER — TECHNETIUM TC 99M MEBROFENIN IV KIT
5.5000 | PACK | Freq: Once | INTRAVENOUS | Status: AC | PRN
  Administered 2019-07-23: 5.5 via INTRAVENOUS

## 2019-07-29 IMAGING — DX DG KNEE AP/LAT W/ SUNRISE*L*
3 series · 3 of 3 positions shown · non-contrast
Comparison: 07/15/2016

CLINICAL DATA: Chronic left knee pain

EXAM:
LEFT KNEE 3 VIEWS

[knee standing ap]
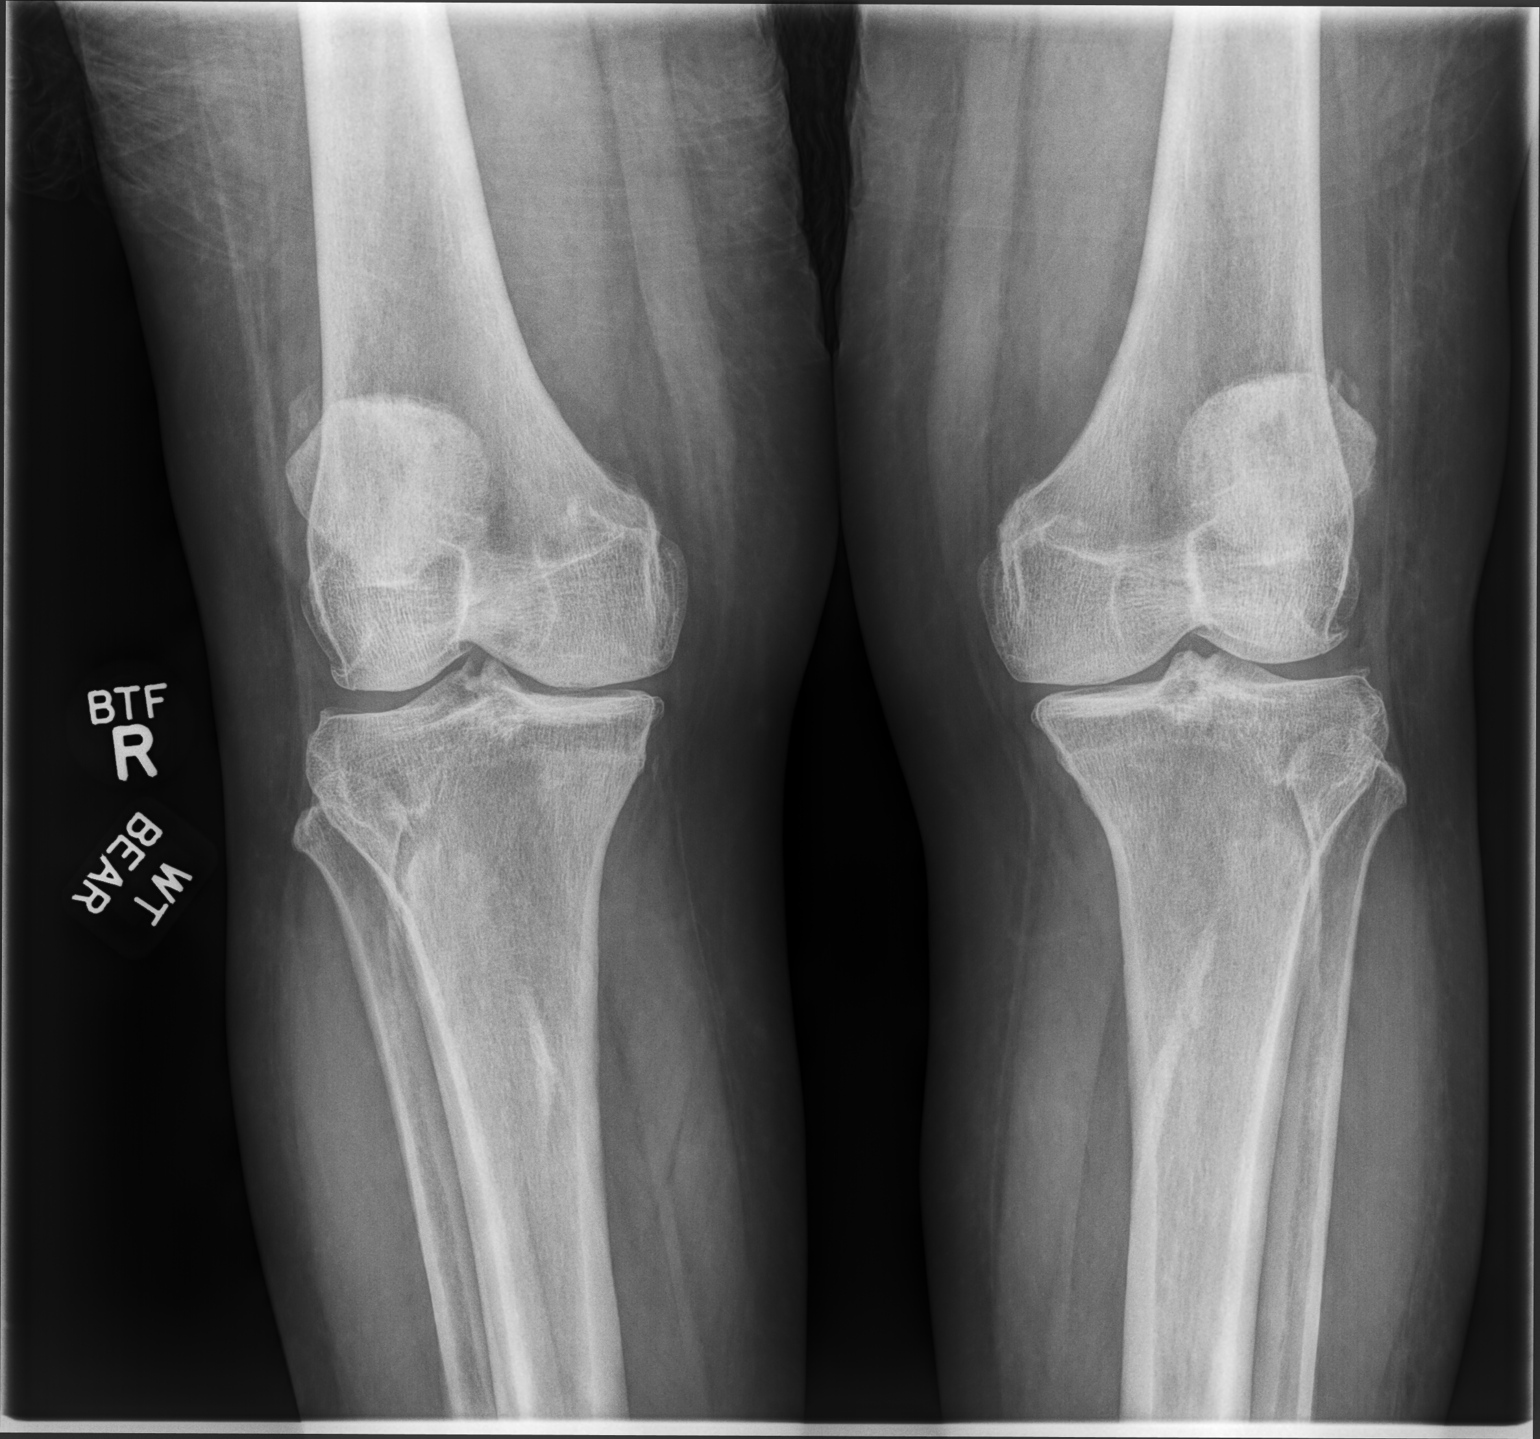

[knee standing lat]
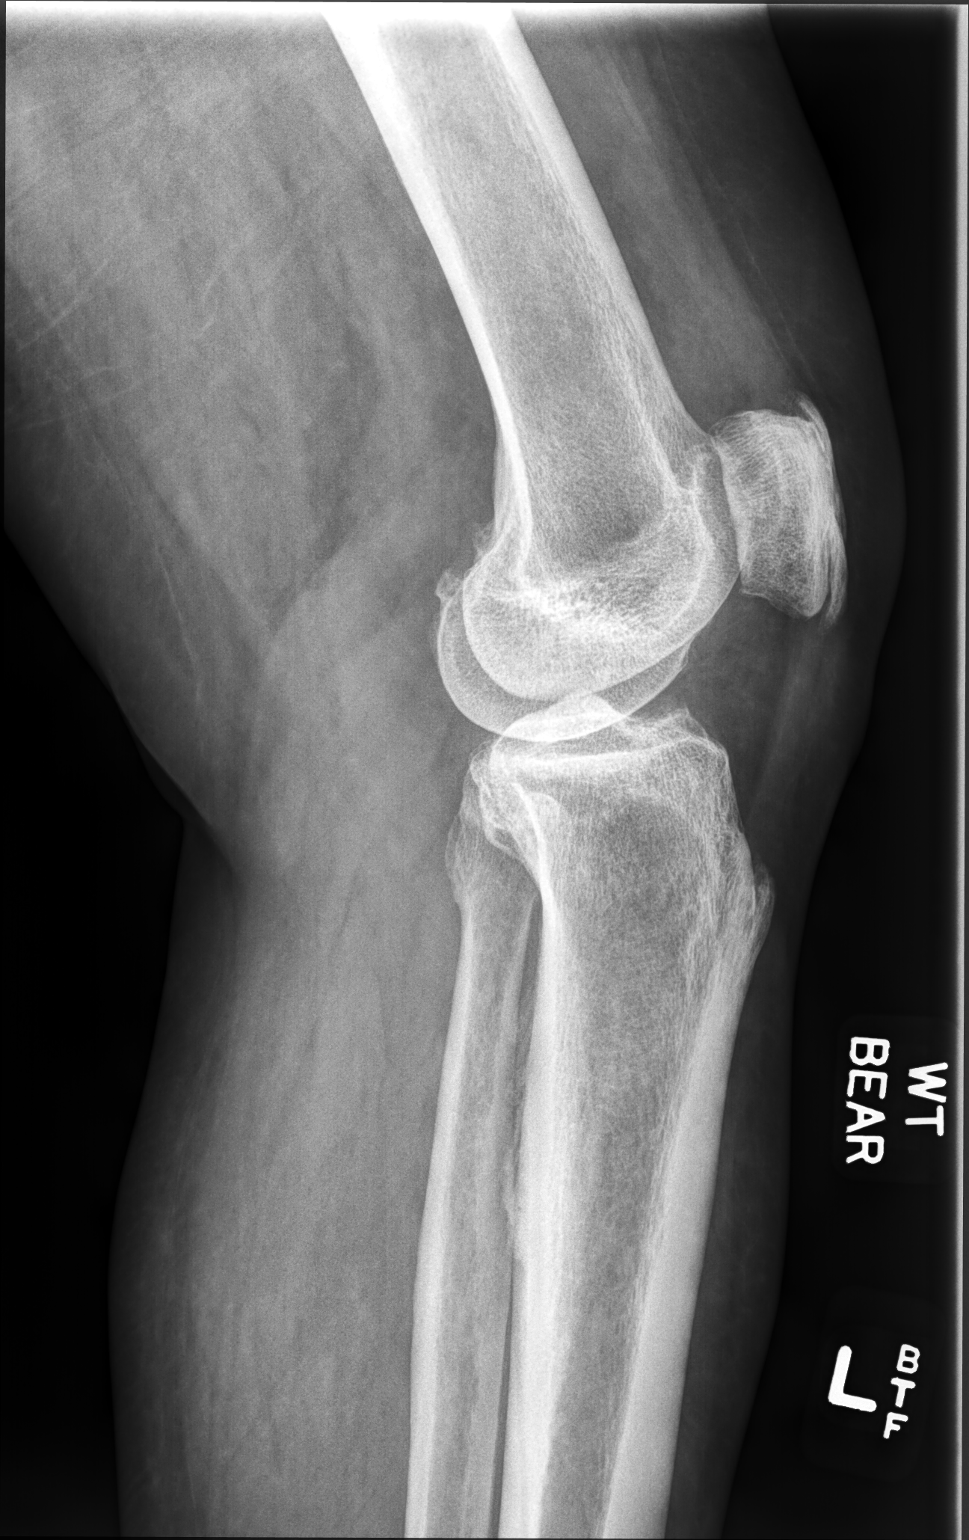

[sunrise]
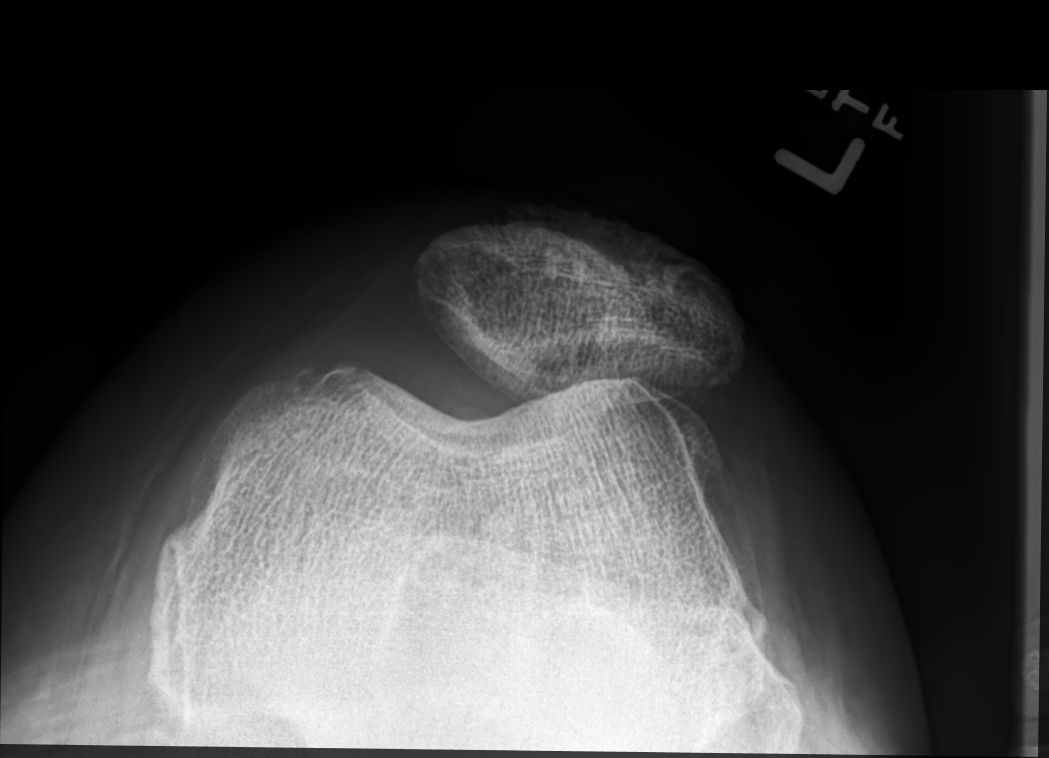

[3 of 3 positions shown; findings below may reference images not displayed]

FINDINGS: Moderate tricompartment osteoarthritic change. No acute fracture or
dislocation. There is preferential narrowing of the medial
compartment while standing. There is spurring of the superior and
inferior patella. There is marked lateral subluxation of the patella
in the trochlear groove on the sunrise view.
IMPRESSION: No acute bony pathology.  Chronic changes.

## 2019-08-03 ENCOUNTER — Other Ambulatory Visit: Payer: Self-pay | Admitting: *Deleted

## 2019-08-03 MED ORDER — LOSARTAN POTASSIUM 100 MG PO TABS: ORAL_TABLET | ORAL | 0 refills | Status: DC

## 2019-08-03 MED ORDER — PRAVASTATIN SODIUM 40 MG PO TABS: ORAL_TABLET | ORAL | 0 refills | Status: DC

## 2019-08-16 ENCOUNTER — Other Ambulatory Visit: Payer: Self-pay | Admitting: Physician Assistant

## 2019-08-21 ENCOUNTER — Ambulatory Visit (INDEPENDENT_AMBULATORY_CARE_PROVIDER_SITE_OTHER): Payer: 59

## 2019-08-21 ENCOUNTER — Other Ambulatory Visit: Payer: Self-pay

## 2019-08-21 DIAGNOSIS — Z23 Encounter for immunization: Secondary | ICD-10-CM

## 2019-08-27 ENCOUNTER — Other Ambulatory Visit: Payer: Self-pay | Admitting: Physician Assistant

## 2019-08-27 ENCOUNTER — Other Ambulatory Visit: Payer: Self-pay

## 2019-08-27 MED ORDER — POTASSIUM CHLORIDE CRYS ER 10 MEQ PO TBCR: EXTENDED_RELEASE_TABLET | ORAL | 0 refills | Status: DC

## 2019-08-31 ENCOUNTER — Other Ambulatory Visit: Payer: Self-pay | Admitting: Physician Assistant

## 2019-09-17 ENCOUNTER — Other Ambulatory Visit: Payer: Self-pay | Admitting: Physician Assistant

## 2019-09-19 ENCOUNTER — Other Ambulatory Visit: Payer: Self-pay | Admitting: Physician Assistant

## 2019-09-22 ENCOUNTER — Encounter: Payer: Self-pay | Admitting: Physician Assistant

## 2019-09-22 DIAGNOSIS — K8020 Calculus of gallbladder without cholecystitis without obstruction: Secondary | ICD-10-CM | POA: Insufficient documentation

## 2019-10-04 ENCOUNTER — Other Ambulatory Visit: Payer: Self-pay | Admitting: Physician Assistant

## 2019-10-15 ENCOUNTER — Other Ambulatory Visit: Payer: Self-pay | Admitting: Physician Assistant

## 2019-10-20 ENCOUNTER — Other Ambulatory Visit: Payer: Self-pay | Admitting: Physician Assistant

## 2019-10-20 MED ORDER — FUROSEMIDE 40 MG PO TABS: ORAL_TABLET | ORAL | 0 refills | Status: DC

## 2019-11-17 ENCOUNTER — Other Ambulatory Visit: Payer: Self-pay | Admitting: Physician Assistant

## 2019-11-17 LAB — BASIC METABOLIC PANEL
BUN: 11 (ref 4–21)
CO2: 23 — AB (ref 13–22)
Chloride: 107 (ref 99–108)
Creatinine: 0.9 (ref 0.5–1.1)
Glucose: 95
Potassium: 4.3 (ref 3.4–5.3)
Sodium: 146 (ref 137–147)

## 2019-11-17 LAB — COMPREHENSIVE METABOLIC PANEL
Calcium: 10.1 (ref 8.7–10.7)
GFR calc Af Amer: 84

## 2019-11-17 LAB — CBC AND DIFFERENTIAL
HCT: 44 (ref 36–46)
Hemoglobin: 14.4 (ref 12.0–16.0)
Neutrophils Absolute: 3
Platelets: 288 (ref 150–399)
WBC: 4.4

## 2019-11-17 LAB — HEPATIC FUNCTION PANEL
ALT: 25 (ref 7–35)
AST: 26 (ref 13–35)
Alkaline Phosphatase: 105 (ref 25–125)
Bilirubin, Total: 0.3

## 2019-11-25 ENCOUNTER — Other Ambulatory Visit: Payer: Self-pay | Admitting: Physician Assistant

## 2019-12-04 ENCOUNTER — Encounter: Payer: Self-pay | Admitting: Physician Assistant

## 2019-12-10 ENCOUNTER — Other Ambulatory Visit: Payer: Self-pay | Admitting: Physician Assistant

## 2019-12-17 ENCOUNTER — Other Ambulatory Visit: Payer: Self-pay | Admitting: Physician Assistant

## 2019-12-27 ENCOUNTER — Other Ambulatory Visit: Payer: Self-pay | Admitting: Physician Assistant

## 2020-01-03 ENCOUNTER — Other Ambulatory Visit: Payer: Self-pay | Admitting: Physician Assistant

## 2020-01-10 ENCOUNTER — Other Ambulatory Visit: Payer: Self-pay | Admitting: Physician Assistant

## 2020-01-14 ENCOUNTER — Other Ambulatory Visit: Payer: Self-pay | Admitting: Physician Assistant

## 2020-02-07 ENCOUNTER — Other Ambulatory Visit: Payer: Self-pay | Admitting: Physician Assistant

## 2020-02-13 ENCOUNTER — Other Ambulatory Visit: Payer: Self-pay | Admitting: Physician Assistant

## 2020-02-17 ENCOUNTER — Encounter: Payer: Self-pay | Admitting: Family Medicine

## 2020-02-17 ENCOUNTER — Telehealth: Payer: Self-pay | Admitting: Physician Assistant

## 2020-02-17 ENCOUNTER — Telehealth (INDEPENDENT_AMBULATORY_CARE_PROVIDER_SITE_OTHER): Payer: 59 | Admitting: Family Medicine

## 2020-02-17 ENCOUNTER — Telehealth: Payer: Self-pay

## 2020-02-17 VITALS — BP 120/70 | HR 64 | Temp 97.6°F | Wt 229.0 lb

## 2020-02-17 DIAGNOSIS — Z889 Allergy status to unspecified drugs, medicaments and biological substances status: Secondary | ICD-10-CM

## 2020-02-17 DIAGNOSIS — R0981 Nasal congestion: Secondary | ICD-10-CM | POA: Diagnosis not present

## 2020-02-17 DIAGNOSIS — J309 Allergic rhinitis, unspecified: Secondary | ICD-10-CM | POA: Diagnosis not present

## 2020-02-17 DIAGNOSIS — R519 Headache, unspecified: Secondary | ICD-10-CM | POA: Diagnosis not present

## 2020-02-17 MED ORDER — CEFDINIR 300 MG PO CAPS: 300.0000 mg | ORAL_CAPSULE | Freq: Two times a day (BID) | ORAL | 0 refills | Status: DC

## 2020-02-17 NOTE — Telephone Encounter (Signed)
FYI..triage note. Pt has a virtual appt 02/17/20 with Dr.Kim. Thanks

## 2020-02-17 NOTE — Telephone Encounter (Signed)
Noted, pt has appt today.

## 2020-02-17 NOTE — Telephone Encounter (Signed)
Nurse Assessment Nurse: Sherrell Puller, RN, Amy Date/Time [name] Time): 02/17/2020 9:45:47 AM Confirm and document reason for call. If symptomatic, describe symptoms. ---Caller states she's having tremors on her right arm for a few days, weakness, headache, eye pain, nausea, intermittent wheezing. Has seizure disorder and has not had any but says she was worried because she started smelling a funny smell that no one else could smell. Temp is 97.6. Received the Covid vaccine on 01/25/20 and next is due on 02/22/20. Has the patient had close contact with a person known or suspected to have the novel coronavirus illness OR traveled / lives in area with major community spread (including international travel) in the last 14 days from the onset of symptoms? * If Asymptomatic, screen for exposure and travel within the last 14 days. ---No Does the patient have any new or worsening symptoms? ---Yes Will a triage be completed? ---Yes Related visit to physician within the last 2 weeks? ---No Does the PT have any chronic conditions? (i.e. diabetes, asthma, this includes High risk factors for pregnancy, etc.) ---Yes List chronic conditions. ---Seizure, HTN, OA, sleep apnea, hypothyroidism, cataract left eye Is this a behavioral health or substance abuse call? ---NoPLEASE NOTE: All timestamps contained within this report are represented as [place] Standard Time. CONFIDENTIALTY NOTICE: This fax transmission is intended only for the addressee. It contains information that is legally privileged, confidential or otherwise protected from use or disclosure. If you are not the intended recipient, you are strictly prohibited from reviewing, disclosing, copying using or disseminating any of this information or taking any action in reliance on or regarding this information. If you have received this fax in error, please notify us immediately by telephone so that we can arrange for its return to [place]. Phone: [phone],  Toll-Free: [phone], Fax: [phone] Page: 2 of 2 Call Id: [id number] Guidelines Guideline Title Affirmed Question Affirmed Notes Nurse Date/Time [name] Time) Headache [1] MODERATE headache (e.g., interferes with normal activities) AND [2] present > 24 hours AND [3] unexplained (Exceptions: analgesics not tried, typical migraine, or headache part of viral illness) Sherrell Puller, RN, Amy 02/17/2020 9:55:27 AM Disp. Time [name] Time) Disposition Final User 02/17/2020 10:01:32 AM See PCP within 24 Hours Yes Sherrell Puller, RN, Amy Caller Disagree/Comply Comply Caller Understands Yes PreDisposition Did not know what to do Care Advice Given Per Guideline SEE PCP WITHIN 24 HOURS: * IF OFFICE WILL BE OPEN: You need to be seen within the next 24 hours. Call your doctor (or NP/PA) when the office opens and make an appointment. PAIN MEDICINES: * For pain relief, you can take either acetaminophen, ibuprofen, or naproxen. REST: * Lie down in a dark quiet place and relax until feeling better. LOCAL COLD: * Apply a cold wet washcloth or cold pack to the forehead for 20 minutes. CALL BACK IF: * You become worse. CARE ADVICE given per Headache (Adult) guideline. This nurse also went over the Covid vaccine guideline as well, disposition was home care. Care advice given and patient verbalized understanding.

## 2020-02-17 NOTE — Progress Notes (Addendum)
Virtual Visit via Video Note  I connected with [name]  on 02/17/20 at 12:00 PM EDT by a video enabled telemedicine application and verified that I am speaking with the correct person using two identifiers.  Location patient: home Location provider:work or home office Persons participating in the virtual visit: patient, provider  I discussed the limitations of evaluation and management by telemedicine and the availability of in person appointments. The patient expressed understanding and agreed to proceed.   HPI:  Acute visit for Sinus Congestion: -started a few weeks ago -symptoms include sinus congestion, sinus pain and pressure on L face, intermittent smells in nose -reports sinus pain improves with tylenol, then returns the next day -takes zyrtec for her allergies, chronic congestion with this -no fevers, SOB, loss of taste or smell, GI symptoms or known sick exposures -has allergy to doxycycline, rash with penicillin listed -has covid19 vaccine scheduled for 5 days from now, second dose   ROS: See pertinent positives and negatives per HPI.  Past Medical History:  Diagnosis Date  . Anemia   . C. difficile diarrhea    2015 -- caught from mother  . Carpal tunnel syndrome   . Chronic migraine   . Congenital cataract    left eye, blind in left eye  . Connective tissue disease ([place])   . Diverticulitis    distal colon -- confrimed on CT on 01/16/16 and colonoscopy on 08/29/15  . H/O hiatal hernia    no meds - no problems  . Headache(784.0)    cannot tolerate many NSAIDs, would do injection of depomedrol in office  . HLA B27 (HLA B27 positive)   . Hyperlipidemia   . Hypertensive retinopathy   . Hypothyroidism    well controlled  . Kidney stones   . Memory loss    associated with peudotumor cerebri  . Obesity, morbid ([place])   . Osteoarthritis   . 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 Prediabetes   . Pseudotumor cerebri syndrome    on lasix  . Radiculopathy   .  Seasonal allergies   . Seizures ([place])     on meds - last one 10/18/11- meds increased -- thinks of soma  . Sinusitis   . Sleep apnea    Does use CPAP  . Spondylarthritis   . Transient alteration of awareness   . Uterine fibroid   . Vitamin D deficiency     Past Surgical History:  Procedure Laterality Date  . BACK SURGERY  09/28/2007   Degenerative Disc Disease - surgery on  L3/4, L4/5  . EYE SURGERY  1975   left eye cataract surgery  . IUD REMOVAL  01/09/2012   Procedure: INTRAUTERINE DEVICE (IUD) REMOVAL;  Surgeon: Marvene Staff, MD;  Location: [facility];  Service: Gynecology;  Laterality: N/A;  . OVARIAN CYST REMOVAL  01/09/2012   Procedure: OVARIAN CYSTECTOMY;  Surgeon: Marvene Staff, MD;  Location: [facility];  Service: Gynecology;  Laterality: Right;  . right knee arthroscopic  09/24/2006  . TONSILLECTOMY    . VESICOVAGINAL FISTULA CLOSURE W/ TAH     for fibroids    Family History  Problem Relation Age of Onset  . Hypertension Mother   . Heart disease Mother   . Diabetes Mother   . Lupus Mother   . Dementia Mother   . CAD Father   . Heart disease Father   . Hypertension Brother   . Diabetes Brother   . Hypertension Sister   . Breast cancer Sister   .  Heart disease Maternal Grandmother   . CVA Maternal Grandmother   . Colon cancer Neg Hx     SOCIAL HX: see hpi   Current Outpatient Medications:  .  acetaminophen (TYLENOL) 500 MG tablet, Take 500 mg by mouth every 6 (six) hours as needed. Patient used this medication for pain., Disp: , Rfl:  .  amLODipine (NORVASC) 10 MG tablet, TAKE 1 TABLET(10 MG) BY MOUTH DAILY, Disp: 90 tablet, Rfl: 0 .  Biotin (RA BIOTIN) 2.5 MG CAPS, Take 1 capsule by mouth daily., Disp: , Rfl:  .  Calcium Carbonate (CALCIUM 600 PO), Take 2 tablets by mouth daily., Disp: , Rfl:  .  carvedilol (COREG) 25 MG tablet, TAKE 1 TABLET(25 MG) BY MOUTH TWICE DAILY, Disp: 180 tablet, Rfl: 0 .  cetirizine (ZYRTEC) 10 MG tablet, Take 1 tablet  (10 mg total) by mouth daily., Disp: 30 tablet, Rfl: 11 .  Cholecalciferol (VITAMIN D-3) 5000 units TABS, Takes one tablet daily., Disp: 30 tablet, Rfl:  .  Clobetasol & Clobetasol Emul 0.05 & 0.05 % MISC, Apply 1 application topically 2 (two) times daily as needed. , Disp: , Rfl:  .  CRANBERRY PO, Take 2 capsules by mouth 3 (three) times daily. , Disp: , Rfl:  .  diclofenac sodium (VOLTAREN) 1 % GEL, Apply 2 g topically 4 (four) times daily., Disp: , Rfl:  .  furosemide (LASIX) 40 MG tablet, TAKE 1 AND 1/2 TABLETS(60 MG) BY MOUTH DAILY, Disp: 45 tablet, Rfl: 0 .  gabapentin (NEURONTIN) 300 MG capsule, TAKE 1 CAPSULE BY MOUTH AT BEDTIME, Disp: 90 capsule, Rfl: 0 .  hydroxychloroquine (PLAQUENIL) 200 MG tablet, Take 200 mg by mouth 2 (two) times daily., Disp: , Rfl:  .  levothyroxine (SYNTHROID) 75 MCG tablet, TAKE 1 TABLET(75 MCG) BY MOUTH DAILY, Disp: 90 tablet, Rfl: 1 .  losartan (COZAAR) 100 MG tablet, TAKE 1 TABLET(100 MG) BY MOUTH DAILY, Disp: 30 tablet, Rfl: 0 .  Multiple Vitamin (MULTIVITAMIN) tablet, Take 2 tablets by mouth daily., Disp: , Rfl:  .  Omega-3 Fatty Acids (FISH OIL) 1000 MG CAPS, Take 1 capsule by mouth daily. , Disp: , Rfl:  .  potassium chloride (KLOR-CON) 10 MEQ tablet, TAKE 2 TABLETS(20 MEQ) BY MOUTH DAILY, Disp: 180 tablet, Rfl: 0 .  pravastatin (PRAVACHOL) 40 MG tablet, TAKE 1 TABLET(40 MG) BY MOUTH DAILY, Disp: 30 tablet, Rfl: 0 .  Probiotic Product (PROBIOTIC DAILY PO), Take 1 tablet by mouth daily., Disp: , Rfl:  .  Pyridoxine HCl (VITAMIN B6) 200 MG TABS, Take one tablet daily., Disp: 30 tablet, Rfl:  .  triamcinolone ointment (KENALOG) 0.5 %, Apply to affected area 1-2 x daily, Disp: 30 g, Rfl: 0 .  zonisamide (ZONEGRAN) 100 MG capsule, Take 400 mg by mouth daily. , Disp: , Rfl:  .  cefdinir (OMNICEF) 300 MG capsule, Take 1 capsule (300 mg total) by mouth 2 (two) times daily., Disp: 20 capsule, Rfl: 0  EXAM:  VITALS per patient if applicable:  GENERAL: alert,  oriented, appears well and in no acute distress  HEENT: atraumatic, conjunttiva clear, no obvious abnormalities on inspection of external nose and ears, moist mucus membranes  NECK: normal movements of the head and neck, denies LAD on self exam  LUNGS: on inspection no signs of respiratory distress, breathing rate appears normal, no obvious gross SOB, gasping or wheezing  CV: no obvious cyanosis  MS: moves all visible extremities without noticeable abnormality  PSYCH/NEURO: pleasant and cooperative, no obvious depression   or anxiety, speech and thought processing grossly intact  ASSESSMENT AND PLAN:  Discussed the following assessment and plan:  Sinus congestion  Facial pain  Allergic rhinitis, unspecified seasonality, unspecified trigger  -we discussed possible serious and likely etiologies, options for evaluation and workup, limitations of telemedicine visit vs in person visit, treatment, treatment risks and precautions. Pt prefers to treat via telemedicine empirically rather then risking or undertaking an in person visit at this moment. Suspect possible sinusitis as a result of chornic congestion from allergies. Opted for treatment with Cefdinir after discussion of options given pt allergies. Discussed potential for cross reaction with penicillin allergy and pt aware. Patient agrees to seek prompt in person care if worsening, new symptoms arise, or if is not improving with treatment. Discussed chronic allergy treatment options.   I discussed the assessment and treatment plan with the patient. The patient was provided an opportunity to ask questions and all were answered. The patient agreed with the plan and demonstrated an understanding of the instructions.   The patient was advised to call back or seek an in-person evaluation if the symptoms worsen or if the condition fails to improve as anticipated.   Hannah R Kim, DO   

## 2020-02-17 NOTE — Patient Instructions (Signed)
-  I sent the medication(s) we discussed to your pharmacy: Meds ordered this encounter  Medications  . cefdinir (OMNICEF) 300 MG capsule    Sig: Take 1 capsule (300 mg total) by mouth 2 (two) times daily.    Dispense:  20 capsule    Refill:  0    Please let us know if you have any questions or concerns regarding this prescription.  I hope you are feeling better soon! Seek care promptly if your symptoms worsen, new concerns arise or you are not improving with treatment.

## 2020-02-17 NOTE — Telephone Encounter (Signed)
[name] RECORD AccessNurse Patient Name: Tammy Avery Gender: Female DOB: 1961/11/14 Age: 60 Y 66 M 28 D Return Phone Number: [phone] (Primary) Address: City/State/Zip: [place] [address] Client [name] Physician Inda Coke- PA Contact Type Call Who Is Calling Patient / Member / Family / Caregiver Call Type Triage / Clinical Relationship To Patient Self Return Phone Number [phone] (Primary) Chief Complaint Headache Reason for Call Symptomatic / Request for [place] states she might have a respiratory infection, and wondering what to do. Symptoms: weakness, phlegm, headaches, tremors, eye pains. Wheezing coming and going, head pressure, nausea as well. Translation No Nurse Assessment Nurse: Sherrell Puller, RN, Amy Date/Time [name] Time): 02/17/2020 9:45:47 AM Confirm and document reason for call. If symptomatic, describe symptoms. ---Caller states she's having tremors on her right arm for a few days, weakness, headache, eye pain, nausea, intermittent wheezing. Has seizure disorder and has not had any but says she was worried because she started smelling a funny smell that no one else could smell. Temp is 97.6. Received the Covid vaccine on 01/25/20 and next is due on 02/22/20. Has the patient had close contact with a person known or suspected to have the novel coronavirus illness OR traveled / lives in area with major community spread (including international travel) in the last 14 days from the onset of symptoms? * If Asymptomatic, screen for exposure and travel within the last 14 days. ---No Does the patient have any new or worsening symptoms? ---Yes Will a triage be completed? ---Yes Related visit to physician within the last 2 weeks? ---No Does the PT have any chronic  conditions? (i.e. diabetes, asthma, this includes High risk factors for pregnancy, etc.) ---Yes List chronic conditions. ---Seizure, HTN, OA, sleep apnea, hypothyroidism, cataract left eye Is this a behavioral health or substance abuse call? ---NoPLEASE NOTE: All timestamps contained within this report are represented as [place] Standard Time. CONFIDENTIALTY NOTICE: This fax transmission is intended only for the addressee. It contains information that is legally privileged, confidential or otherwise protected from use or disclosure. If you are not the intended recipient, you are strictly prohibited from reviewing, disclosing, copying using or disseminating any of this information or taking any action in reliance on or regarding this information. If you have received this fax in error, please notify us immediately by telephone so that we can arrange for its return to [place]. Phone: [phone], Toll-Free: [phone], Fax: [phone] Page: 2 of 2 Call Id: [id number]      Pt scheduled virtual appt 02/17/20 with Dr.Kim

## 2020-03-04 ENCOUNTER — Other Ambulatory Visit: Payer: Self-pay | Admitting: Physician Assistant

## 2020-04-02 ENCOUNTER — Other Ambulatory Visit: Payer: Self-pay | Admitting: Family Medicine

## 2020-04-16 ENCOUNTER — Other Ambulatory Visit: Payer: Self-pay | Admitting: Physician Assistant

## 2020-05-01 ENCOUNTER — Other Ambulatory Visit: Payer: Self-pay | Admitting: Urology

## 2020-05-01 DIAGNOSIS — N281 Cyst of kidney, acquired: Secondary | ICD-10-CM

## 2020-05-06 ENCOUNTER — Other Ambulatory Visit: Payer: Self-pay | Admitting: Physician Assistant

## 2020-05-12 ENCOUNTER — Other Ambulatory Visit: Payer: Self-pay | Admitting: Physician Assistant

## 2020-05-26 ENCOUNTER — Other Ambulatory Visit: Payer: Self-pay | Admitting: Physician Assistant

## 2020-06-01 ENCOUNTER — Other Ambulatory Visit: Payer: Self-pay | Admitting: Family Medicine

## 2020-06-02 ENCOUNTER — Ambulatory Visit (INDEPENDENT_AMBULATORY_CARE_PROVIDER_SITE_OTHER): Payer: 59 | Admitting: Cardiology

## 2020-06-02 ENCOUNTER — Encounter: Payer: Self-pay | Admitting: Cardiology

## 2020-06-02 ENCOUNTER — Other Ambulatory Visit: Payer: Self-pay

## 2020-06-02 VITALS — BP 140/80 | HR 76 | Ht 63.0 in | Wt 233.0 lb

## 2020-06-02 DIAGNOSIS — E785 Hyperlipidemia, unspecified: Secondary | ICD-10-CM | POA: Diagnosis not present

## 2020-06-02 DIAGNOSIS — G4733 Obstructive sleep apnea (adult) (pediatric): Secondary | ICD-10-CM

## 2020-06-02 DIAGNOSIS — I10 Essential (primary) hypertension: Secondary | ICD-10-CM | POA: Diagnosis not present

## 2020-06-02 NOTE — Patient Instructions (Signed)
Medication Instructions:  The current medical regimen is effective;  continue present plan and medications.  *If you need a refill on your cardiac medications before your next appointment, please call your pharmacy*  Follow-Up: At [facility], you and your health needs are our priority.  As part of our continuing mission to provide you with exceptional heart care, we have created designated Provider Care Teams.  These Care Teams include your primary Cardiologist (physician) and Advanced Practice Providers (APPs -  Physician Assistants and Nurse Practitioners) who all work together to provide you with the care you need, when you need it.  We recommend signing up for the patient portal called "MyChart".  Sign up information is provided on this After Visit Summary.  MyChart is used to connect with patients for Virtual Visits (Telemedicine).  Patients are able to view lab/test results, encounter notes, upcoming appointments, etc.  Non-urgent messages can be sent to your provider as well.   To learn more about what you can do with MyChart, go to [email or web address].    Your next appointment:   12 month(s)  The format for your next appointment:   In Person  Provider:   Mark Skains, MD   Thank you for choosing [facility]!!      

## 2020-06-02 NOTE — Progress Notes (Signed)
Cardiology Office Note:    Date:  06/02/2020   ID:  Tammy Avery, DOB Mar 14, 1961, MRN [id number]  PCP:  Inda Coke, PA  [facility] Cardiologist:  Candee Furbish, MD  [facility] Electrophysiologist:  None   Referring MD: Inda Coke, PA     History of Present Illness:    Tammy Avery is a 59 y.o. female with HTN and family history of heart disease here for follow up.   Premature family history of MI in mother at age 84 Nuclear stress test 2016 normal  Had some shortness of breath when walking uphill otherwise no chest pain.  Arthritis.  Has had back pain and cholelithiasis -- MRI in January 2020  Blood pressure much better controlled on Norvasc losartan and Lasix.  Averages of been 120/70.  No side effects.  She works at the post office.  120/70 at home.  She has had Covid vaccines.  Past Medical History:  Diagnosis Date  . Anemia   . C. difficile diarrhea    2015 -- caught from mother  . Carpal tunnel syndrome   . Chronic migraine   . Congenital cataract    left eye, blind in left eye  . Connective tissue disease ([place])   . Diverticulitis    distal colon -- confrimed on CT on 01/16/16 and colonoscopy on 08/29/15  . H/O hiatal hernia    no meds - no problems  . Headache(784.0)    cannot tolerate many NSAIDs, would do injection of depomedrol in office  . HLA B27 (HLA B27 positive)   . Hyperlipidemia   . Hypertensive retinopathy   . Hypothyroidism    well controlled  . Kidney stones   . Memory loss    associated with peudotumor cerebri  . Obesity, morbid ([place])   . Osteoarthritis   . 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 Prediabetes   . Pseudotumor cerebri syndrome    on lasix  . Radiculopathy   . Seasonal allergies   . Seizures ([place])     on meds - last one 10/18/11- meds increased -- thinks of soma  . Sinusitis   . Sleep apnea    Does use CPAP  . Spondylarthritis   . Transient alteration of awareness   . Uterine fibroid   . Vitamin  D deficiency     Past Surgical History:  Procedure Laterality Date  . BACK SURGERY  09/28/2007   Degenerative Disc Disease - surgery on  L3/4, L4/5  . EYE SURGERY  1975   left eye cataract surgery  . IUD REMOVAL  01/09/2012   Procedure: INTRAUTERINE DEVICE (IUD) REMOVAL;  Surgeon: Marvene Staff, MD;  Location: [facility];  Service: Gynecology;  Laterality: N/A;  . OVARIAN CYST REMOVAL  01/09/2012   Procedure: OVARIAN CYSTECTOMY;  Surgeon: Marvene Staff, MD;  Location: [facility];  Service: Gynecology;  Laterality: Right;  . right knee arthroscopic  09/24/2006  . TONSILLECTOMY    . VESICOVAGINAL FISTULA CLOSURE W/ TAH     for fibroids    Current Medications: Current Meds  Medication Sig  . acetaminophen (TYLENOL) 500 MG tablet Take 500 mg by mouth every 6 (six) hours as needed. Patient used this medication for pain.  Marland Kitchen amLODipine (NORVASC) 10 MG tablet TAKE 1 TABLET(10 MG) BY MOUTH DAILY  . Biotin (RA BIOTIN) 2.5 MG CAPS Take 1 capsule by mouth daily.  . Calcium Carbonate (CALCIUM 600 PO) Take 2 tablets by mouth daily.  . carvedilol (COREG) 25  MG tablet TAKE 1 TABLET(25 MG) BY MOUTH TWICE DAILY  . cetirizine (ZYRTEC) 10 MG tablet Take 1 tablet (10 mg total) by mouth daily.  . Cholecalciferol (VITAMIN D-3) 5000 units TABS Takes one tablet daily.  . Clobetasol & Clobetasol Emul 0.05 & 0.05 % MISC Apply 1 application topically 2 (two) times daily as needed.   Marland Kitchen CRANBERRY PO Take 2 capsules by mouth 3 (three) times daily.   . diclofenac sodium (VOLTAREN) 1 % GEL Apply 2 g topically 4 (four) times daily.  . furosemide (LASIX) 40 MG tablet TAKE 1 AND 1/2 TABLETS(60 MG) BY MOUTH DAILY  . gabapentin (NEURONTIN) 300 MG capsule TAKE 1 CAPSULE BY MOUTH AT BEDTIME  . hydroxychloroquine (PLAQUENIL) 200 MG tablet Take 200 mg by mouth 2 (two) times daily.  Marland Kitchen levothyroxine (SYNTHROID) 75 MCG tablet TAKE 1 TABLET(75 MCG) BY MOUTH DAILY  . losartan (COZAAR) 100 MG tablet TAKE 1 TABLET(100 MG) BY  MOUTH DAILY  . Multiple Vitamin (MULTIVITAMIN) tablet Take 2 tablets by mouth daily.  . Omega-3 Fatty Acids (FISH OIL) 1000 MG CAPS Take 1 capsule by mouth daily.   . potassium chloride (KLOR-CON) 10 MEQ tablet TAKE 2 TABLETS(20 MEQ) BY MOUTH DAILY  . pravastatin (PRAVACHOL) 40 MG tablet TAKE 1 TABLET(40 MG) BY MOUTH DAILY  . Probiotic Product (PROBIOTIC DAILY PO) Take 1 tablet by mouth daily.  . Pyridoxine HCl (VITAMIN B6) 200 MG TABS Take one tablet daily.  Marland Kitchen triamcinolone ointment (KENALOG) 0.5 % Apply to affected area 1-2 x daily  . zonisamide (ZONEGRAN) 100 MG capsule Take 400 mg by mouth daily.      Allergies:   Advil [ibuprofen], Aleve [naproxen sodium], Benadryl [diphenhydramine], Doxycycline, Lactose intolerance (gi), Limbrel [flavocoxid], Mobic [meloxicam], Neosporin [neomycin-bacitracin zn-polymyx], Soma [carisoprodol], Codeine, Latex, Morphine and related, and Penicillins   Social History   Socioeconomic History  . Marital status: Single    Spouse name: Not on file  . Number of children: Not on file  . Years of education: Not on file  . Highest education level: Not on file  Occupational History  . Not on file  Tobacco Use  . Smoking status: Never Smoker  . Smokeless tobacco: Never Used  Substance and Sexual Activity  . Alcohol use: Yes    Alcohol/week: 1.0 standard drink    Types: 1 Glasses of wine per week  . Drug use: No  . Sexual activity: Never    Birth control/protection: None  Other Topics Concern  . 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 Difficulty of Paying Living Expenses:   Food Insecurity:   . Worried About Charity fundraiser in the Last Year:   . Arboriculturist in the Last Year:   Transportation Needs:   . Film/video editor (Medical):   Marland Kitchen Lack of Transportation (Non-Medical):     Physical Activity:   . Days of Exercise per Week:   . Minutes of Exercise per Session:   Stress:   . Feeling of Stress :   Social Connections:   . Frequency of Communication with Friends and Family:   . Frequency of Social Gatherings with Friends and Family:   . Attends Religious Services:   . Active Member of Clubs or Organizations:   . Attends Archivist Meetings:   .  Marital Status:      Family History: The patient's family history includes Breast cancer in her sister; CAD in her father; CVA in her maternal grandmother; Dementia in her mother; Diabetes in her brother and mother; Heart disease in her father, maternal grandmother, and mother; Hypertension in her brother, mother, and sister; Lupus in her mother. There is no history of Colon cancer.  ROS:   Please see the history of present illness.     All other systems reviewed and are negative.  EKGs/Labs/Other Studies Reviewed:       EKG:  EKG is  ordered today.  The ekg ordered today demonstrates sinus rhythm 76.  prior -  Sinus rhythm 68 LAFB with nonspecific ST-T wave changes  Recent Labs: 11/17/2019: ALT 25; BUN 11; Creatinine 0.9; Hemoglobin 14.4; Platelets 288; Potassium 4.3; Sodium 146  Recent Lipid Panel    Component Value Date/Time   CHOL 182 04/26/2019 0836   TRIG 80.0 04/26/2019 0836   HDL 73.80 04/26/2019 0836   CHOLHDL 2 04/26/2019 0836   VLDL 16.0 04/26/2019 0836   LDLCALC 92 04/26/2019 0836    Physical Exam:    VS:  BP 140/80   Pulse 76   Ht 5' 3"  (1.6 m)   Wt 233 lb (105.7 kg)   LMP 12/13/2011   BMI 41.27 kg/m     Wt Readings from Last 3 Encounters:  06/02/20 233 lb (105.7 kg)  02/17/20 229 lb (103.9 kg)  07/23/19 232 lb (105.2 kg)     GEN:  Well nourished, well developed in no acute distress HEENT: Normal NECK: No JVD; No carotid bruits LYMPHATICS: No lymphadenopathy CARDIAC: RRR, no murmurs, rubs, gallops RESPIRATORY:  Clear to auscultation without rales, wheezing or  rhonchi  ABDOMEN: Soft, non-tender, non-distended MUSCULOSKELETAL:  No edema; No deformity  SKIN: Warm and dry NEUROLOGIC:  Alert and oriented x 3 PSYCHIATRIC:  Normal affect   ASSESSMENT:    1. Hyperlipidemia, unspecified hyperlipidemia type   2. Essential hypertension   3. Morbid obesity due to excess calories ([place])   4. OSA (obstructive sleep apnea)    PLAN:    In order of problems listed above:  Essential hypertension - Much better control.  Medications reviewed as above.  Continue with weight loss.  At home blood pressures have been 120/70 for instance.  She is logging these readings.  Excellent.  No changes made today.  Obstructive sleep apnea - Neurology following. Wearing mask off and on.  Morbid obesity -Continue to encourage weight loss.   She states that she is eating more salads.  Decrease carbohydrates.  Hyperlipidemia -LDL 92, hemoglobin A1c 5.6 hemoglobin 14.4 creatinine 0.9 from outside labs.  ALT 25.  Continue with pravastatin   Medication Adjustments/Labs and Tests Ordered: Current medicines are reviewed at length with the patient today.  Concerns regarding medicines are outlined above.  Orders Placed This Encounter  Procedures  . EKG 12-Lead   No orders of the defined types were placed in this encounter.   Patient Instructions  Medication Instructions:  The current medical regimen is effective;  continue present plan and medications.  *If you need a refill on your cardiac medications before your next appointment, please call your pharmacy*  Follow-Up: At [facility], you and your health needs are our priority.  As part of our continuing mission to provide you with exceptional heart care, we have created designated Provider Care Teams.  These Care Teams include your primary Cardiologist (physician) and Advanced Practice Providers (APPs -  Physician Assistants and Nurse Practitioners) who all work together to provide you with the care you need,  when you need it.  We recommend signing up for the patient portal called "MyChart".  Sign up information is provided on this After Visit Summary.  MyChart is used to connect with patients for Virtual Visits (Telemedicine).  Patients are able to view lab/test results, encounter notes, upcoming appointments, etc.  Non-urgent messages can be sent to your provider as well.   To learn more about what you can do with MyChart, go to [email or web address].    Your next appointment:   12 month(s)  The format for your next appointment:   In Person  Provider:   Candee Furbish, MD   Thank you for choosing [facility]!!        Signed, Candee Furbish, MD  06/02/2020 8:45 AM    [place]

## 2020-06-05 ENCOUNTER — Ambulatory Visit
Admission: RE | Admit: 2020-06-05 | Discharge: 2020-06-05 | Disposition: A | Discharge status: Home / Self Care | Payer: 59 | Source: Ambulatory Visit | Attending: Urology | Admitting: Urology

## 2020-06-05 ENCOUNTER — Other Ambulatory Visit: Payer: Self-pay

## 2020-06-05 DIAGNOSIS — N281 Cyst of kidney, acquired: Secondary | ICD-10-CM

## 2020-06-05 MED ORDER — GADOBENATE DIMEGLUMINE 529 MG/ML IV SOLN
20.0000 mL | Freq: Once | INTRAVENOUS | Status: AC | PRN
  Administered 2020-06-05: 20 mL via INTRAVENOUS

## 2020-06-08 ENCOUNTER — Other Ambulatory Visit: Payer: Self-pay | Admitting: Physician Assistant

## 2020-07-04 ENCOUNTER — Other Ambulatory Visit: Payer: Self-pay | Admitting: Physician Assistant

## 2020-07-13 ENCOUNTER — Other Ambulatory Visit: Payer: Self-pay

## 2020-07-13 ENCOUNTER — Telehealth (INDEPENDENT_AMBULATORY_CARE_PROVIDER_SITE_OTHER): Payer: 59 | Admitting: Family Medicine

## 2020-07-13 DIAGNOSIS — R0981 Nasal congestion: Secondary | ICD-10-CM

## 2020-07-13 DIAGNOSIS — R519 Headache, unspecified: Secondary | ICD-10-CM | POA: Diagnosis not present

## 2020-07-13 DIAGNOSIS — R197 Diarrhea, unspecified: Secondary | ICD-10-CM

## 2020-07-13 DIAGNOSIS — Z20822 Contact with and (suspected) exposure to covid-19: Secondary | ICD-10-CM | POA: Diagnosis not present

## 2020-07-13 NOTE — Patient Instructions (Addendum)
-   get COVID19 testing as soon as possible. If positive, the COVID19 antibody treatment center number is [phone].  -imodium if needed for diarrhea for per instructions  -Tylenol 500-1000mg  up to 3 times daily if needed for pain  -nasal saline 1-2 times daily and Afrin nasal spray for 3 days  -stay home while sick and if COVID19 testing is positive stay home for a full 10 days PLUS 1 day of feeling better and no fevers  I hope you are feeling better soon! Seek inperson care promptly or schedule a follow up visit if your symptoms worsen, new concerns arise or you are not improving with treatment.     WORK SLIP:  Patient Tammy Avery,  03/21/1961, was seen for a medical visit today, 07/13/20. Please excuse from work according to the [facility] guidelines for a COVID like illness. We advise 10 days minimum from the onset of symptoms (07/10/20) PLUS 1 day of no fever and improved symptoms. Will defer to employer for a sooner return to work if [place] testing is negative and the symptoms have resolved. Advise following CDC guidelines.    Sincerely: E-signature: Dr. Colin Benton, DO [place] Ph: [phone]

## 2020-07-13 NOTE — Progress Notes (Signed)
Virtual Visit via Video Note  I connected with Tammy Avery  on 07/13/20 at 10:40 AM EDT by a video enabled telemedicine application and verified that I am speaking with the correct person using two identifiers.  Location patient: home, [place] Location provider:work or home office Persons participating in the virtual visit: patient, provider  I discussed the limitations of evaluation and management by telemedicine and the availability of in person appointments. The patient expressed understanding and agreed to proceed.   HPI:  Acute visit for sinus congestion and headaches: -started about 3 days ago -symptoms include nasal congestion, pnd, chills, sinus pressure, more tired than usual, throbbing periorbital/bifrontal headache (some sound sensitivity) - similar to her migraines, upset stomach, diarrhea, nausea, sneezing, some body aches  -Excedrin helped some, ate some yogurt -denies fevers, SOB, CP, vomiting, weakness, numbness, dizziness, vision changes, worst headache of her life -found out that several of her coworkers, some who work close to her, have had covid this past week, she has been within 6 feet of those individuals for > 15 minutes, they do wear masks - but they are lose fitting masks -fully vaccinated for COVID19 with moderna - last dose in April   ROS: See pertinent positives and negatives per HPI.  Past Medical History:  Diagnosis Date  . Anemia   . C. difficile diarrhea    2015 -- caught from mother  . Carpal tunnel syndrome   . Chronic migraine   . Congenital cataract    left eye, blind in left eye  . Connective tissue disease ([place])   . Diverticulitis    distal colon -- confrimed on CT on 01/16/16 and colonoscopy on 08/29/15  . H/O hiatal hernia    no meds - no problems  . Headache(784.0)    cannot tolerate many NSAIDs, would do injection of depomedrol in office  . HLA B27 (HLA B27 positive)   . Hyperlipidemia   . Hypertensive retinopathy   . Hypothyroidism    well  controlled  . Kidney stones   . Memory loss    associated with peudotumor cerebri  . Obesity, morbid ([place])   . Osteoarthritis   . 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 Prediabetes   . Pseudotumor cerebri syndrome    on lasix  . Radiculopathy   . Seasonal allergies   . Seizures ([place])     on meds - last one 10/18/11- meds increased -- thinks of soma  . Sinusitis   . Sleep apnea    Does use CPAP  . Spondylarthritis   . Transient alteration of awareness   . Uterine fibroid   . Vitamin D deficiency     Past Surgical History:  Procedure Laterality Date  . BACK SURGERY  09/28/2007   Degenerative Disc Disease - surgery on  L3/4, L4/5  . EYE SURGERY  1975   left eye cataract surgery  . IUD REMOVAL  01/09/2012   Procedure: INTRAUTERINE DEVICE (IUD) REMOVAL;  Surgeon: Marvene Staff, MD;  Location: [facility];  Service: Gynecology;  Laterality: N/A;  . OVARIAN CYST REMOVAL  01/09/2012   Procedure: OVARIAN CYSTECTOMY;  Surgeon: Marvene Staff, MD;  Location: [facility];  Service: Gynecology;  Laterality: Right;  . right knee arthroscopic  09/24/2006  . TONSILLECTOMY    . VESICOVAGINAL FISTULA CLOSURE W/ TAH     for fibroids    Family History  Problem Relation Age of Onset  . Hypertension Mother   . Heart disease Mother   . Diabetes Mother   .  Lupus Mother   . Dementia Mother   . CAD Father   . Heart disease Father   . Hypertension Brother   . Diabetes Brother   . Hypertension Sister   . Breast cancer Sister   . Heart disease Maternal Grandmother   . CVA Maternal Grandmother   . Colon cancer Neg Hx     SOCIAL HX: see hpi   Current Outpatient Medications:  .  acetaminophen (TYLENOL) 500 MG tablet, Take 500 mg by mouth every 6 (six) hours as needed. Patient used this medication for pain., Disp: , Rfl:  .  amLODipine (NORVASC) 10 MG tablet, TAKE 1 TABLET(10 MG) BY MOUTH DAILY, Disp: 90 tablet, Rfl: 0 .  Biotin (RA BIOTIN) 2.5 MG CAPS, Take 1 capsule by mouth daily.,  Disp: , Rfl:  .  Calcium Carbonate (CALCIUM 600 PO), Take 2 tablets by mouth daily., Disp: , Rfl:  .  carvedilol (COREG) 25 MG tablet, TAKE 1 TABLET(25 MG) BY MOUTH TWICE DAILY, Disp: 180 tablet, Rfl: 0 .  cetirizine (ZYRTEC) 10 MG tablet, Take 1 tablet (10 mg total) by mouth daily., Disp: 30 tablet, Rfl: 11 .  Cholecalciferol (VITAMIN D-3) 5000 units TABS, Takes one tablet daily., Disp: 30 tablet, Rfl:  .  Clobetasol & Clobetasol Emul 0.05 & 0.05 % MISC, Apply 1 application topically 2 (two) times daily as needed. , Disp: , Rfl:  .  CRANBERRY PO, Take 2 capsules by mouth 3 (three) times daily. , Disp: , Rfl:  .  diclofenac sodium (VOLTAREN) 1 % GEL, Apply 2 g topically 4 (four) times daily., Disp: , Rfl:  .  furosemide (LASIX) 40 MG tablet, TAKE 1 AND 1/2 TABLETS(60 MG) BY MOUTH DAILY, Disp: 45 tablet, Rfl: 0 .  gabapentin (NEURONTIN) 300 MG capsule, TAKE 1 CAPSULE BY MOUTH AT BEDTIME, Disp: 90 capsule, Rfl: 0 .  hydroxychloroquine (PLAQUENIL) 200 MG tablet, Take 200 mg by mouth 2 (two) times daily., Disp: , Rfl:  .  levothyroxine (SYNTHROID) 75 MCG tablet, TAKE 1 TABLET(75 MCG) BY MOUTH DAILY, Disp: 90 tablet, Rfl: 1 .  losartan (COZAAR) 100 MG tablet, TAKE 1 TABLET(100 MG) BY MOUTH DAILY, Disp: 30 tablet, Rfl: 0 .  Multiple Vitamin (MULTIVITAMIN) tablet, Take 2 tablets by mouth daily., Disp: , Rfl:  .  Omega-3 Fatty Acids (FISH OIL) 1000 MG CAPS, Take 1 capsule by mouth daily. , Disp: , Rfl:  .  potassium chloride (KLOR-CON) 10 MEQ tablet, TAKE 2 TABLETS(20 MEQ) BY MOUTH DAILY, Disp: 180 tablet, Rfl: 0 .  pravastatin (PRAVACHOL) 40 MG tablet, TAKE 1 TABLET(40 MG) BY MOUTH DAILY, Disp: 30 tablet, Rfl: 0 .  Probiotic Product (PROBIOTIC DAILY PO), Take 1 tablet by mouth daily., Disp: , Rfl:  .  Pyridoxine HCl (VITAMIN B6) 200 MG TABS, Take one tablet daily., Disp: 30 tablet, Rfl:  .  triamcinolone ointment (KENALOG) 0.5 %, Apply to affected area 1-2 x daily, Disp: 30 g, Rfl: 0 .  zonisamide  (ZONEGRAN) 100 MG capsule, Take 400 mg by mouth daily. , Disp: , Rfl:   EXAM:  VITALS per patient if applicable: T 98  GENERAL: alert, oriented, appears well and in no acute distress  HEENT: atraumatic, conjunttiva clear, no obvious abnormalities on inspection of external nose and ears  NECK: normal movements of the head and neck  LUNGS: on inspection no signs of respiratory distress, breathing rate appears normal, no obvious gross SOB, gasping or wheezing  CV: no obvious cyanosis  MS: moves all visible  extremities without noticeable abnormality  PSYCH/NEURO: pleasant and cooperative, no obvious depression or anxiety, speech and thought processing grossly intact  ASSESSMENT AND PLAN:  Discussed the following assessment and plan:  Nasal congestion  Diarrhea, unspecified type  Nonintractable headache, unspecified chronicity pattern, unspecified headache type  Close exposure to COVID-19 virus  -we discussed possible serious and likely etiologies, options for evaluation and workup, limitations of telemedicine visit vs in person visit, treatment, treatment risks and precautions. Pt prefers to treat via telemedicine empirically rather then risking or undertaking an in person visit at this moment. Multiple symptoms in different systems suggests viral illness, COVID19 vs other. She also has a history of migraines and may have had a migraine. She is fully vaccinated for covid19, but given recent rise in breakthrough cases with delta and her close exposure advised COVID19 testing and isolation. Discussed treatment options, precautions, testing options, limitations, etc. Sympomatic care with imodium if needed, nasal saline, tylenol, short course nasal saline. Given some risk factor for covid19 provided treatment center information in case of positive test. Work/School slipped offered: Advised to seek prompt follow up telemedicine visit or in person care if worsening, new symptoms arise, or if  is not improving with treatment. Did let her know that I only do telemedicine on Tuesdays and Thursdays for [place] and advised follow up visit with PCP or [facility] if needs follow up or if any further questions arise to avoid any delays. Follow up next week.    I discussed the assessment and treatment plan with the patient. The patient was provided an opportunity to ask questions and all were answered. The patient agreed with the plan and demonstrated an understanding of the instructions.   The patient was advised to call back or seek an in-person evaluation if the symptoms worsen or if the condition fails to improve as anticipated.   Lucretia Kern, DO

## 2020-07-14 ENCOUNTER — Other Ambulatory Visit: Payer: Self-pay | Admitting: Physician Assistant

## 2020-07-17 ENCOUNTER — Encounter: Payer: Self-pay | Admitting: Family Medicine

## 2020-08-06 ENCOUNTER — Other Ambulatory Visit: Payer: Self-pay | Admitting: Physician Assistant

## 2020-08-11 ENCOUNTER — Other Ambulatory Visit: Payer: Self-pay | Admitting: Physician Assistant

## 2020-08-17 ENCOUNTER — Other Ambulatory Visit: Payer: Self-pay | Admitting: Physician Assistant

## 2020-08-18 ENCOUNTER — Telehealth: Payer: Self-pay

## 2020-08-18 NOTE — Telephone Encounter (Signed)
MEDICATION: pravastatin & losartan  PHARMACY: [facility] [address]  Comments:   **Let patient know to contact pharmacy at the end of the day to make sure medication is ready. **  ** Please notify patient to allow 48-72 hours to process**  **Encourage patient to contact the pharmacy for refills or they can request refills through [facility]**

## 2020-08-21 NOTE — Telephone Encounter (Signed)
LVM for patient to call back and schedule appt with Samantha.  

## 2020-08-21 NOTE — Telephone Encounter (Signed)
Please call pt and schedule an appt. I can not refill her medications has not been seen since last June 2020. She was already given 30 day supply no refill last time and told needs appt.

## 2020-08-25 ENCOUNTER — Other Ambulatory Visit: Payer: Self-pay | Admitting: Physician Assistant

## 2020-08-28 ENCOUNTER — Encounter: Payer: Self-pay | Admitting: Physician Assistant

## 2020-08-28 ENCOUNTER — Ambulatory Visit (INDEPENDENT_AMBULATORY_CARE_PROVIDER_SITE_OTHER): Payer: 59 | Admitting: Physician Assistant

## 2020-08-28 ENCOUNTER — Other Ambulatory Visit: Payer: Self-pay

## 2020-08-28 VITALS — BP 146/80 | HR 78 | Temp 97.5°F | Ht 63.0 in | Wt 241.0 lb

## 2020-08-28 DIAGNOSIS — I10 Essential (primary) hypertension: Secondary | ICD-10-CM

## 2020-08-28 DIAGNOSIS — E039 Hypothyroidism, unspecified: Secondary | ICD-10-CM | POA: Diagnosis not present

## 2020-08-28 DIAGNOSIS — Z23 Encounter for immunization: Secondary | ICD-10-CM

## 2020-08-28 DIAGNOSIS — G629 Polyneuropathy, unspecified: Secondary | ICD-10-CM

## 2020-08-28 DIAGNOSIS — E785 Hyperlipidemia, unspecified: Secondary | ICD-10-CM | POA: Diagnosis not present

## 2020-08-28 LAB — CBC WITH DIFFERENTIAL/PLATELET
Absolute Monocytes: 454 cells/uL (ref 200–950)
Basophils Absolute: 29 cells/uL (ref 0–200)
Basophils Relative: 0.7 %
Eosinophils Absolute: 298 cells/uL (ref 15–500)
Eosinophils Relative: 7.1 %
HCT: 38.8 % (ref 35.0–45.0)
Hemoglobin: 12.7 g/dL (ref 11.7–15.5)
Lymphs Abs: 1714 cells/uL (ref 850–3900)
MCH: 28.7 pg (ref 27.0–33.0)
MCHC: 32.7 g/dL (ref 32.0–36.0)
MCV: 87.6 fL (ref 80.0–100.0)
MPV: 10.9 fL (ref 7.5–12.5)
Monocytes Relative: 10.8 %
Neutro Abs: 1705 cells/uL (ref 1500–7800)
Neutrophils Relative %: 40.6 %
Platelets: 238 10*3/uL (ref 140–400)
RBC: 4.43 10*6/uL (ref 3.80–5.10)
RDW: 12.8 % (ref 11.0–15.0)
Total Lymphocyte: 40.8 %
WBC: 4.2 10*3/uL (ref 3.8–10.8)

## 2020-08-28 LAB — COMPREHENSIVE METABOLIC PANEL
AG Ratio: 1.6 (calc) (ref 1.0–2.5)
ALT: 19 U/L (ref 6–29)
AST: 22 U/L (ref 10–35)
Albumin: 3.9 g/dL (ref 3.6–5.1)
Alkaline phosphatase (APISO): 74 U/L (ref 37–153)
BUN: 14 mg/dL (ref 7–25)
CO2: 28 mmol/L (ref 20–32)
Calcium: 9.5 mg/dL (ref 8.6–10.4)
Chloride: 109 mmol/L (ref 98–110)
Creat: 0.87 mg/dL (ref 0.50–1.05)
Globulin: 2.4 g/dL (calc) (ref 1.9–3.7)
Glucose, Bld: 85 mg/dL (ref 65–99)
Potassium: 3.9 mmol/L (ref 3.5–5.3)
Sodium: 141 mmol/L (ref 135–146)
Total Bilirubin: 0.4 mg/dL (ref 0.2–1.2)
Total Protein: 6.3 g/dL (ref 6.1–8.1)

## 2020-08-28 LAB — LIPID PANEL
Cholesterol: 190 mg/dL (ref <200)
HDL: 89 mg/dL (ref >=50)
LDL Cholesterol (Calc): 84 mg/dL (calc)
Non-HDL Cholesterol (Calc): 101 mg/dL (calc) (ref <130)
Total CHOL/HDL Ratio: 2.1 (calc) (ref <5.0)
Triglycerides: 84 mg/dL (ref <150)

## 2020-08-28 LAB — TSH: TSH: 2.26 mIU/L (ref 0.40–4.50)

## 2020-08-28 NOTE — Progress Notes (Signed)
Tammy Avery is a 59 y.o. female is here for follow up.  I acted as a Education administrator for [employer], PA-C Anselmo Pickler, LPN   History of Present Illness:   Chief Complaint  Patient presents with  . Hypertension  . Hyperlipidemia    HPI   Hypertension Pt currently taking Norvasc 10 mg, Coreg 25 mg BID, Losartan 100 mg, Lasix 60 mg daily. Pt is checking blood pressure at home, averaging 131/78.  Pt denies headaches, dizziness, blurred vision, chest pain, SOB or lower leg edema. Denies excessive caffeine intake, stimulant usage, excessive alcohol intake or increase in salt consumption.  Hyperlipidemia Pt currently taking Pravastatin 40 mg daily. Tolerating well.  Hypothyroidism Currently taking 75 mcg of synthroid daily. Tolerating well without concerns. Denies significant unexplained changes in tolerance to temperature or weight changes.  Neuropathy Currently taking 300 mg gabapentin today. She is planning to start PT at [facility] soon for her back pain. She is hoping that she can lose weight when she gets more active.     Health Maintenance Due  Topic Date Due  . MAMMOGRAM  07/30/2019  . INFLUENZA VACCINE  06/18/2020    Past Medical History:  Diagnosis Date  . Anemia   . C. difficile diarrhea    2015 -- caught from mother  . Carpal tunnel syndrome   . Chronic migraine   . Congenital cataract    left eye, blind in left eye  . Connective tissue disease ([place])   . Diverticulitis    distal colon -- confrimed on CT on 01/16/16 and colonoscopy on 08/29/15  . H/O hiatal hernia    no meds - no problems  . Headache(784.0)    cannot tolerate many NSAIDs, would do injection of depomedrol in office  . HLA B27 (HLA B27 positive)   . Hyperlipidemia   . Hypertensive retinopathy   . Hypothyroidism    well controlled  . Kidney stones   . Memory loss    associated with peudotumor cerebri  . Obesity, morbid ([place])   . Osteoarthritis   . 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 Prediabetes   . Pseudotumor cerebri syndrome    on lasix  . Radiculopathy   . Seasonal allergies   . Seizures ([place])     on meds - last one 10/18/11- meds increased -- thinks of soma  . Sinusitis   . Sleep apnea    Does use CPAP  . Spondylarthritis   . Transient alteration of awareness   . Uterine fibroid   . Vitamin D deficiency      Social History   Tobacco Use  . Smoking status: Never Smoker  . Smokeless tobacco: Never Used  Substance Use Topics  . Alcohol use: Yes    Alcohol/week: 1.0 standard drink    Types: 1 Glasses of wine per week  . Drug use: No    Past Surgical History:  Procedure Laterality Date  . BACK SURGERY  09/28/2007   Degenerative Disc Disease - surgery on  L3/4, L4/5  . EYE SURGERY  1975   left eye cataract surgery  . IUD REMOVAL  01/09/2012   Procedure: INTRAUTERINE DEVICE (IUD) REMOVAL;  Surgeon: Marvene Staff, MD;  Location: [facility];  Service: Gynecology;  Laterality: N/A;  . OVARIAN CYST REMOVAL  01/09/2012   Procedure: OVARIAN CYSTECTOMY;  Surgeon: Marvene Staff, MD;  Location: [facility];  Service: Gynecology;  Laterality: Right;  . right knee arthroscopic  09/24/2006  .  TONSILLECTOMY    . VESICOVAGINAL FISTULA CLOSURE W/ TAH     for fibroids    Family History  Problem Relation Age of Onset  . Hypertension Mother   . Heart disease Mother   . Diabetes Mother   . Lupus Mother   . Dementia Mother   . CAD Father   . Heart disease Father   . Hypertension Brother   . Diabetes Brother   . Hypertension Sister   . Breast cancer Sister   . Heart disease Maternal Grandmother   . CVA Maternal Grandmother   . Colon cancer Neg Hx     PMHx, SurgHx, SocialHx, FamHx, Medications, and Allergies were reviewed in the Visit Navigator and updated as appropriate.   Patient Active Problem List   Diagnosis Date Noted  . Gallstones 09/22/2019  . Lymphadenopathy, abdominal 11/10/2018  . 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 Seizures ([place])  04/30/2017  . Pseudotumor cerebri syndrome 04/30/2017  . Hypothyroidism 04/30/2017  . Chronic pain of left knee 12/09/2016  . Atypical chest pain 08/10/2015  . Essential hypertension 12/27/2013  . Hyperlipidemia 12/27/2013  . Morbid obesity ([place]) 12/27/2013  . Left anterior fascicular block 12/27/2013  . Prediabetes 12/27/2013    Social History   Tobacco Use  . Smoking status: Never Smoker  . Smokeless tobacco: Never Used  Substance Use Topics  . Alcohol use: Yes    Alcohol/week: 1.0 standard drink    Types: 1 Glasses of wine per week  . Drug use: No    Current Medications and Allergies:    Current Outpatient Medications:  .  acetaminophen (TYLENOL) 500 MG tablet, Take 500 mg by mouth every 6 (six) hours as needed. Patient used this medication for pain., Disp: , Rfl:  .  amLODipine (NORVASC) 10 MG tablet, TAKE 1 TABLET(10 MG) BY MOUTH DAILY, Disp: 90 tablet, Rfl: 0 .  Biotin (RA BIOTIN) 2.5 MG CAPS, Take 1 capsule by mouth daily., Disp: , Rfl:  .  Calcium Carbonate (CALCIUM 600 PO), Take 2 tablets by mouth daily., Disp: , Rfl:  .  carvedilol (COREG) 25 MG tablet, TAKE 1 TABLET(25 MG) BY MOUTH TWICE DAILY, Disp: 180 tablet, Rfl: 0 .  cetirizine (ZYRTEC) 10 MG tablet, Take 1 tablet (10 mg total) by mouth daily., Disp: 30 tablet, Rfl: 11 .  Cholecalciferol (VITAMIN D-3) 5000 units TABS, Takes one tablet daily., Disp: 30 tablet, Rfl:  .  Clobetasol & Clobetasol Emul 0.05 & 0.05 % MISC, Apply 1 application topically 2 (two) times daily as needed. , Disp: , Rfl:  .  CRANBERRY PO, Take 2 capsules by mouth 3 (three) times daily. , Disp: , Rfl:  .  diclofenac sodium (VOLTAREN) 1 % GEL, Apply 2 g topically 4 (four) times daily., Disp: , Rfl:  .  furosemide (LASIX) 40 MG tablet, TAKE 1 AND 1/2 TABLETS(60 MG) BY MOUTH DAILY, Disp: 45 tablet, Rfl: 0 .  gabapentin (NEURONTIN) 300 MG capsule, TAKE 1 CAPSULE BY MOUTH AT BEDTIME, Disp: 90 capsule, Rfl: 0 .  hydroxychloroquine (PLAQUENIL) 200  MG tablet, Take 200 mg by mouth 2 (two) times daily., Disp: , Rfl:  .  levothyroxine (SYNTHROID) 75 MCG tablet, TAKE 1 TABLET(75 MCG) BY MOUTH DAILY, Disp: 90 tablet, Rfl: 1 .  losartan (COZAAR) 100 MG tablet, TAKE 1 TABLET(100 MG) BY MOUTH DAILY, Disp: 30 tablet, Rfl: 0 .  Multiple Vitamin (MULTIVITAMIN) tablet, Take 2 tablets by mouth daily., Disp: , Rfl:  .  Omega-3 Fatty Acids (FISH OIL) 1000 MG CAPS,  Take 1 capsule by mouth daily. , Disp: , Rfl:  .  potassium chloride (KLOR-CON) 10 MEQ tablet, TAKE 2 TABLETS(20 MEQ) BY MOUTH DAILY, Disp: 180 tablet, Rfl: 0 .  pravastatin (PRAVACHOL) 40 MG tablet, TAKE 1 TABLET(40 MG) BY MOUTH DAILY, Disp: 30 tablet, Rfl: 0 .  Probiotic Product (PROBIOTIC DAILY PO), Take 1 tablet by mouth daily., Disp: , Rfl:  .  Pyridoxine HCl (VITAMIN B6) 200 MG TABS, Take one tablet daily., Disp: 30 tablet, Rfl:  .  triamcinolone ointment (KENALOG) 0.5 %, Apply to affected area 1-2 x daily, Disp: 30 g, Rfl: 0 .  zonisamide (ZONEGRAN) 100 MG capsule, Take 400 mg by mouth daily. , Disp: , Rfl:    Allergies  Allergen Reactions  . Shellfish Allergy Itching  . Advil [Ibuprofen]     Rapid heartbeat  . Aleve [Naproxen Sodium]     dizziness nausea  . Benadryl [Diphenhydramine] Hives and Other (See Comments)    fatigue  . Doxycycline Other (See Comments)    Kidney stones  . Lactose Intolerance (Gi) Diarrhea  . Limbrel [Flavocoxid]     Diarrhea    . Mobic [Meloxicam]     vomiting   . Neosporin [Neomycin-Bacitracin Zn-Polymyx]     Causes infection  . Soma [Carisoprodol] Other (See Comments)    seizures  . Codeine Rash    Patient states that Codeine in liquid form causes her rash reaction.  But other forms of codeine are okay for patient to take.  . Latex Rash  . Morphine And Related Itching and Rash  . Penicillins Rash    Review of Systems   ROS Negative unless otherwise specified per HPI.  Vitals:   Vitals:   08/28/20 0912  BP: (!) 146/80  Pulse: 78    Temp: (!) 97.5 F (36.4 C)  TempSrc: Temporal  SpO2: 98%  Weight: 241 lb (109.3 kg)  Height: 5' 3"  (1.6 m)     Body mass index is 42.69 kg/m.   Physical Exam:    Physical Exam Vitals and nursing note reviewed.  Constitutional:      General: She is not in acute distress.    Appearance: She is well-developed. She is not ill-appearing or toxic-appearing.  Cardiovascular:     Rate and Rhythm: Normal rate and regular rhythm.     Pulses: Normal pulses.     Heart sounds: Normal heart sounds, S1 normal and S2 normal.     Comments: No LE edema Pulmonary:     Effort: Pulmonary effort is normal.     Breath sounds: Normal breath sounds.  Skin:    General: Skin is warm and dry.  Neurological:     Mental Status: She is alert.     GCS: GCS eye subscore is 4. GCS verbal subscore is 5. GCS motor subscore is 6.  Psychiatric:        Speech: Speech normal.        Behavior: Behavior normal. Behavior is cooperative.      Assessment and Plan:    Camree was seen today for hypertension and hyperlipidemia.  Diagnoses and all orders for this visit:  Essential hypertension Currently well controlled. Continue current regimen of: Norvasc 10 mg, Coreg 25 mg BID, Losartan 100 mg, Lasix 60 mg daily.  Follow-up in 6 months, sooner if concerns. -     CBC with Differential/Platelet; Future -     Comprehensive metabolic panel; Future -     Comprehensive metabolic panel -  CBC with Differential/Platelet  Hyperlipidemia, unspecified hyperlipidemia type Update lipid panel today and adjust pravastatin 40 mg as indicated. -     Lipid panel; Future -     Lipid panel  Hypothyroidism, unspecified type Update thyroid panel today and adjust levothyroxine 75 mcg as indicated. -     TSH; Future -     TSH  Neuropathy Patient endorses good control with 300 mg gabapentin daily.  CMA or LPN served as scribe during this visit. History, Physical, and Plan performed by medical provider. The above  documentation has been reviewed and is accurate and complete.   Inda Coke, PA-C [place], [place] 08/28/2020  Follow-up: No follow-ups on file.

## 2020-08-28 NOTE — Patient Instructions (Signed)
It was great to see you!  Flu shot today.  Update labs today.  I will refill all of your medications once your labs have returned, which will be the next day or so.  Let's follow-up in 6 months, sooner if you have concerns.  Due to recent changes in healthcare laws, you may see the results of your imaging and laboratory studies on MyChart before your provider has had a chance to review them.  We understand that in some cases there may be results that are confusing or concerning to you. Not all laboratory results come back in the same time frame and the provider may be waiting for multiple results in order to interpret others.  Please give [place] 48 hours in order for your provider to thoroughly review all the results before contacting the office for clarification of your results.   Take care,  Inda Coke PA-C

## 2020-08-29 ENCOUNTER — Other Ambulatory Visit: Payer: Self-pay | Admitting: Physician Assistant

## 2020-08-29 MED ORDER — POTASSIUM CHLORIDE CRYS ER 10 MEQ PO TBCR: 10.0000 meq | EXTENDED_RELEASE_TABLET | Freq: Two times a day (BID) | ORAL | 2 refills | Status: DC

## 2020-08-29 MED ORDER — CARVEDILOL 25 MG PO TABS: 25.0000 mg | ORAL_TABLET | Freq: Two times a day (BID) | ORAL | 2 refills | Status: DC

## 2020-08-29 MED ORDER — LOSARTAN POTASSIUM 100 MG PO TABS: 100.0000 mg | ORAL_TABLET | Freq: Every day | ORAL | 2 refills | Status: DC

## 2020-08-29 MED ORDER — FUROSEMIDE 40 MG PO TABS: 60.0000 mg | ORAL_TABLET | Freq: Every day | ORAL | 2 refills | Status: DC

## 2020-08-29 MED ORDER — PRAVASTATIN SODIUM 40 MG PO TABS: 40.0000 mg | ORAL_TABLET | Freq: Every day | ORAL | 2 refills | Status: DC

## 2020-08-29 MED ORDER — AMLODIPINE BESYLATE 10 MG PO TABS: 10.0000 mg | ORAL_TABLET | Freq: Every day | ORAL | 2 refills | Status: DC

## 2020-08-29 MED ORDER — GABAPENTIN 300 MG PO CAPS: 300.0000 mg | ORAL_CAPSULE | Freq: Every day | ORAL | 2 refills | Status: DC

## 2020-08-29 MED ORDER — LEVOTHYROXINE SODIUM 75 MCG PO TABS: 75.0000 ug | ORAL_TABLET | Freq: Every day | ORAL | 2 refills | Status: DC

## 2020-09-13 ENCOUNTER — Telehealth (INDEPENDENT_AMBULATORY_CARE_PROVIDER_SITE_OTHER): Payer: 59 | Admitting: Physician Assistant

## 2020-09-13 ENCOUNTER — Encounter: Payer: Self-pay | Admitting: Physician Assistant

## 2020-09-13 VITALS — Ht 63.0 in | Wt 241.0 lb

## 2020-09-13 DIAGNOSIS — M545 Low back pain, unspecified: Secondary | ICD-10-CM | POA: Diagnosis not present

## 2020-09-13 NOTE — Progress Notes (Signed)
Virtual Visit via Video   I connected with Tammy Avery on 09/13/20 at 11:00 AM EDT by a video enabled telemedicine application and verified that I am speaking with the correct person using two identifiers. Location patient: Home Location provider: [facility], Office Persons participating in the virtual visit: MIKKI ZIFF, Inda Coke PA-C, Anselmo Pickler, LPN   I discussed the limitations of evaluation and management by telemedicine and the availability of in person appointments. The patient expressed understanding and agreed to proceed.  I acted as a Education administrator for [employer], [employer], LPN   Subjective:   HPI:   Muscle spasms Was doing some exercises she saw on Facebook last week. She has recently started walking with her sister also.  Pt c/o lower back muscle spasms started on Monday. She has been taking Tylenol and Aleve with some relief. Denies: nausea, vomiting, dysuria, hematuria, bowel/bladder incontinence, bony tenderness, radiation of pain, abdominal pain.  Symptoms were at their worst last night and were significantly improved this AM.  ROS: See pertinent positives and negatives per HPI.  Patient Active Problem List   Diagnosis Date Noted  . Gallstones 09/22/2019  . Lymphadenopathy, abdominal 11/10/2018  . 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 Seizures ([place]) 04/30/2017  . Pseudotumor cerebri syndrome 04/30/2017  . Hypothyroidism 04/30/2017  . Chronic pain of left knee 12/09/2016  . Atypical chest pain 08/10/2015  . Essential hypertension 12/27/2013  . Hyperlipidemia 12/27/2013  . Morbid obesity ([place]) 12/27/2013  . Left anterior fascicular block 12/27/2013  . Prediabetes 12/27/2013    Social History   Tobacco Use  . Smoking status: Never Smoker  . Smokeless tobacco: Never Used  Substance Use Topics  . Alcohol use: Yes    Alcohol/week: 1.0 standard drink    Types: 1 Glasses of wine per week    Current Outpatient Medications:  .   acetaminophen (TYLENOL) 500 MG tablet, Take 500 mg by mouth every 6 (six) hours as needed. Patient used this medication for pain., Disp: , Rfl:  .  amLODipine (NORVASC) 10 MG tablet, Take 1 tablet (10 mg total) by mouth daily., Disp: 90 tablet, Rfl: 2 .  Biotin (RA BIOTIN) 2.5 MG CAPS, Take 1 capsule by mouth daily., Disp: , Rfl:  .  Calcium Carbonate (CALCIUM 600 PO), Take 2 tablets by mouth daily., Disp: , Rfl:  .  carvedilol (COREG) 25 MG tablet, Take 1 tablet (25 mg total) by mouth 2 (two) times daily with a meal., Disp: 180 tablet, Rfl: 2 .  cetirizine (ZYRTEC) 10 MG tablet, Take 1 tablet (10 mg total) by mouth daily., Disp: 30 tablet, Rfl: 11 .  Cholecalciferol (VITAMIN D-3) 5000 units TABS, Takes one tablet daily., Disp: 30 tablet, Rfl:  .  Clobetasol & Clobetasol Emul 0.05 & 0.05 % MISC, Apply 1 application topically 2 (two) times daily as needed. , Disp: , Rfl:  .  CRANBERRY PO, Take 2 capsules by mouth 3 (three) times daily. , Disp: , Rfl:  .  diclofenac sodium (VOLTAREN) 1 % GEL, Apply 2 g topically 4 (four) times daily., Disp: , Rfl:  .  furosemide (LASIX) 40 MG tablet, Take 1.5 tablets (60 mg total) by mouth daily., Disp: 135 tablet, Rfl: 2 .  gabapentin (NEURONTIN) 300 MG capsule, Take 1 capsule (300 mg total) by mouth at bedtime., Disp: 90 capsule, Rfl: 2 .  hydroxychloroquine (PLAQUENIL) 200 MG tablet, Take 200 mg by mouth 2 (two) times daily., Disp: , Rfl:  .  levothyroxine (SYNTHROID)  75 MCG tablet, Take 1 tablet (75 mcg total) by mouth daily before breakfast., Disp: 90 tablet, Rfl: 2 .  losartan (COZAAR) 100 MG tablet, Take 1 tablet (100 mg total) by mouth daily., Disp: 90 tablet, Rfl: 2 .  Multiple Vitamin (MULTIVITAMIN) tablet, Take 2 tablets by mouth daily., Disp: , Rfl:  .  Omega-3 Fatty Acids (FISH OIL) 1000 MG CAPS, Take 1 capsule by mouth daily. , Disp: , Rfl:  .  potassium chloride (KLOR-CON) 10 MEQ tablet, Take 1 tablet (10 mEq total) by mouth 2 (two) times daily., Disp:  180 tablet, Rfl: 2 .  pravastatin (PRAVACHOL) 40 MG tablet, Take 1 tablet (40 mg total) by mouth daily., Disp: 90 tablet, Rfl: 2 .  Probiotic Product (PROBIOTIC DAILY PO), Take 1 tablet by mouth daily., Disp: , Rfl:  .  Pyridoxine HCl (VITAMIN B6) 200 MG TABS, Take one tablet daily., Disp: 30 tablet, Rfl:  .  triamcinolone ointment (KENALOG) 0.5 %, Apply to affected area 1-2 x daily, Disp: 30 g, Rfl: 0 .  zonisamide (ZONEGRAN) 100 MG capsule, Take 400 mg by mouth daily. , Disp: , Rfl:   Allergies  Allergen Reactions  . Shellfish Allergy Itching  . Advil [Ibuprofen]     Rapid heartbeat  . Aleve [Naproxen Sodium]     dizziness nausea  . Benadryl [Diphenhydramine] Hives and Other (See Comments)    fatigue  . Doxycycline Other (See Comments)    Kidney stones  . Lactose Intolerance (Gi) Diarrhea  . Limbrel [Flavocoxid]     Diarrhea    . Mobic [Meloxicam]     vomiting   . Neosporin [Neomycin-Bacitracin Zn-Polymyx]     Causes infection  . Soma [Carisoprodol] Other (See Comments)    seizures  . Codeine Rash    Patient states that Codeine in liquid form causes her rash reaction.  But other forms of codeine are okay for patient to take.  . Latex Rash  . Morphine And Related Itching and Rash  . Penicillins Rash  . Prednisone Palpitations    Weight gain    Objective:   VITALS: Per patient if applicable, see vitals. GENERAL: Alert, appears well and in no acute distress. HEENT: Atraumatic, conjunctiva clear, no obvious abnormalities on inspection of external nose and ears. NECK: Normal movements of the head and neck. CARDIOPULMONARY: No increased WOB. Speaking in clear sentences. I:E ratio WNL.  MS: Moves all visible extremities without noticeable abnormality. PSYCH: Pleasant and cooperative, well-groomed. Speech normal rate and rhythm. Affect is appropriate. Insight and judgement are appropriate. Attention is focused, linear, and appropriate.  NEURO: CN grossly intact. Oriented as  arrived to appointment on time with no prompting. Moves both UE equally.  SKIN: No obvious lesions, wounds, erythema, or cyanosis noted on face or hands.  Assessment and Plan:   Polly was seen today for spasms.  Diagnoses and all orders for this visit:  Acute bilateral low back pain without sciatica  No red flags on discussion. Discussed different medication options but she has issues with most (increased sedation with increased gabapentin dose, palpitations with prednisone, palpitations with daily NSAIDs, sedation with muscle relaxers.) Will trial topical voltaren gel and alternating ibuprofen and tylenol. If symptoms persist, we will consider a short round of prednisone. Worsening precautions advised in the meantime.  I discussed the assessment and treatment plan with the patient. The patient was provided an opportunity to ask questions and all were answered. The patient agreed with the plan and demonstrated an understanding of  the instructions.   The patient was advised to call back or seek an in-person evaluation if the symptoms worsen or if the condition fails to improve as anticipated.   CMA or LPN served as scribe during this visit. History, Physical, and Plan performed by medical provider. The above documentation has been reviewed and is accurate and complete.   [place], [place] 09/13/2020

## 2020-11-17 ENCOUNTER — Other Ambulatory Visit: Payer: Self-pay | Admitting: Physician Assistant

## 2020-12-08 ENCOUNTER — Other Ambulatory Visit: Payer: Self-pay | Admitting: Physician Assistant

## 2020-12-08 DIAGNOSIS — Z1231 Encounter for screening mammogram for malignant neoplasm of breast: Secondary | ICD-10-CM

## 2020-12-11 ENCOUNTER — Encounter: Payer: Self-pay | Admitting: Physician Assistant

## 2020-12-11 ENCOUNTER — Encounter (HOSPITAL_COMMUNITY): Payer: Self-pay

## 2020-12-11 ENCOUNTER — Ambulatory Visit (HOSPITAL_COMMUNITY)
Admission: RE | Admit: 2020-12-11 | Discharge: 2020-12-11 | Disposition: A | Discharge status: Home / Self Care | Payer: 59 | Source: Ambulatory Visit | Attending: Physician Assistant | Admitting: Physician Assistant

## 2020-12-11 ENCOUNTER — Other Ambulatory Visit: Payer: Self-pay

## 2020-12-11 ENCOUNTER — Ambulatory Visit (INDEPENDENT_AMBULATORY_CARE_PROVIDER_SITE_OTHER): Payer: 59 | Admitting: Physician Assistant

## 2020-12-11 VITALS — BP 150/80 | HR 86 | Temp 98.4°F | Ht 63.0 in | Wt 230.0 lb

## 2020-12-11 DIAGNOSIS — R1013 Epigastric pain: Secondary | ICD-10-CM | POA: Insufficient documentation

## 2020-12-11 LAB — POC URINALSYSI DIPSTICK (AUTOMATED)
Bilirubin, UA: NEGATIVE
Blood, UA: NEGATIVE
Glucose, UA: NEGATIVE
Ketones, UA: NEGATIVE
Leukocytes, UA: NEGATIVE
Nitrite, UA: NEGATIVE
Protein, UA: NEGATIVE
Spec Grav, UA: 1.025 (ref 1.010–1.025)
Urobilinogen, UA: 0.2 E.U./dL
pH, UA: 5 (ref 5.0–8.0)

## 2020-12-11 LAB — POCT I-STAT CREATININE: Creatinine, Ser: 0.8 mg/dL (ref 0.44–1.00)

## 2020-12-11 LAB — CBC WITH DIFFERENTIAL/PLATELET
Basophils Absolute: 0 10*3/uL (ref 0.0–0.1)
Basophils Relative: 1 % (ref 0.0–3.0)
Eosinophils Absolute: 0.2 10*3/uL (ref 0.0–0.7)
Eosinophils Relative: 4.1 % (ref 0.0–5.0)
HCT: 43.6 % (ref 36.0–46.0)
Hemoglobin: 14.3 g/dL (ref 12.0–15.0)
Lymphocytes Relative: 31.5 % (ref 12.0–46.0)
Lymphs Abs: 1.5 10*3/uL (ref 0.7–4.0)
MCHC: 32.7 g/dL (ref 30.0–36.0)
MCV: 88.1 fl (ref 78.0–100.0)
Monocytes Absolute: 0.4 10*3/uL (ref 0.1–1.0)
Monocytes Relative: 8.7 % (ref 3.0–12.0)
Neutro Abs: 2.6 10*3/uL (ref 1.4–7.7)
Neutrophils Relative %: 54.7 % (ref 43.0–77.0)
Platelets: 259 10*3/uL (ref 150.0–400.0)
RBC: 4.95 Mil/uL (ref 3.87–5.11)
RDW: 13.6 % (ref 11.5–15.5)
WBC: 4.8 10*3/uL (ref 4.0–10.5)

## 2020-12-11 LAB — H. PYLORI ANTIBODY, IGG: H Pylori IgG: NEGATIVE

## 2020-12-11 LAB — COMPREHENSIVE METABOLIC PANEL
ALT: 23 U/L (ref 0–35)
AST: 23 U/L (ref 0–37)
Albumin: 4.5 g/dL (ref 3.5–5.2)
Alkaline Phosphatase: 85 U/L (ref 39–117)
BUN: 15 mg/dL (ref 6–23)
CO2: 29 mEq/L (ref 19–32)
Calcium: 9.9 mg/dL (ref 8.4–10.5)
Chloride: 105 mEq/L (ref 96–112)
Creatinine, Ser: 0.87 mg/dL (ref 0.40–1.20)
GFR: 73.5 mL/min (ref >60.00)
Glucose, Bld: 81 mg/dL (ref 70–99)
Potassium: 4 mEq/L (ref 3.5–5.1)
Sodium: 142 mEq/L (ref 135–145)
Total Bilirubin: 0.4 mg/dL (ref 0.2–1.2)
Total Protein: 7.4 g/dL (ref 6.0–8.3)

## 2020-12-11 LAB — LIPASE: Lipase: 23 U/L (ref 11.0–59.0)

## 2020-12-11 MED ORDER — IOHEXOL 300 MG/ML  SOLN
100.0000 mL | Freq: Once | INTRAMUSCULAR | Status: AC | PRN
  Administered 2020-12-11: 100 mL via INTRAVENOUS

## 2020-12-11 MED ORDER — IOHEXOL 9 MG/ML PO SOLN
1000.0000 mL | ORAL | Status: AC
  Administered 2020-12-11: 1000 mL via ORAL

## 2020-12-11 MED ORDER — IOHEXOL 9 MG/ML PO SOLN
ORAL | Status: AC
  Filled 2020-12-11: qty 1000

## 2020-12-11 NOTE — Progress Notes (Signed)
ALEIDA CRANDELL is a 60 y.o. female here for a new problem.  I acted as a Education administrator for [employer], PA-C Anselmo Pickler, LPN   History of Present Illness:   Chief Complaint  Patient presents with  . Blood In Stools    HPI   Blood in stool Pt took Aleve and Tylenol prior to going to work and then started c/o stomach cramps and explosive bowels x 3 and vomited x 1 last Wednesday. Has bloody stools again on Thursday. Next BM was on Saturday and Sunday and they were normal.  Hx of diverticulitis, lower abdomen is still tender. Stools had bright red blood.  Denies: fever, chills, malaise, blood in vomit  Last colonoscopy in June 2016 and had diverticulosis in sigmoid colon. Due for 10 year recall.  Was treated for c diff in the past by PCP. Also had treatment for presumed diverticulitis in 2017 that was confirmed with CT scan.  Has not tested for COVID-19 for this. Has been at work since this happened.   Had a fish sandwich prior to onset of symptoms.  Wt Readings from Last 4 Encounters:  12/11/20 230 lb (104.3 kg)  09/13/20 241 lb (109.3 kg)  08/28/20 241 lb (109.3 kg)  06/02/20 233 lb (105.7 kg)       Past Medical History:  Diagnosis Date  . Anemia   . C. difficile diarrhea    2015 -- caught from mother  . Carpal tunnel syndrome   . Chronic migraine   . Congenital cataract    left eye, blind in left eye  . Connective tissue disease ([place])   . Diverticulitis    distal colon -- confrimed on CT on 01/16/16 and colonoscopy on 08/29/15  . H/O hiatal hernia    no meds - no problems  . Headache(784.0)    cannot tolerate many NSAIDs, would do injection of depomedrol in office  . HLA B27 (HLA B27 positive)   . Hyperlipidemia   . Hypertensive retinopathy   . Hypothyroidism    well controlled  . Kidney stones   . Memory loss    associated with peudotumor cerebri  . Obesity, morbid ([place])   . Osteoarthritis   . 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 Prediabetes   .  Pseudotumor cerebri syndrome    on lasix  . Radiculopathy   . Seasonal allergies   . Seizures ([place])     on meds - last one 10/18/11- meds increased -- thinks of soma  . Sinusitis   . Sleep apnea    Does use CPAP  . Spondylarthritis   . Transient alteration of awareness   . Uterine fibroid   . Vitamin D deficiency      Social History   Tobacco Use  . Smoking status: Never Smoker  . Smokeless tobacco: Never Used  Substance Use Topics  . Alcohol use: Yes    Alcohol/week: 1.0 standard drink    Types: 1 Glasses of wine per week  . Drug use: No    Past Surgical History:  Procedure Laterality Date  . BACK SURGERY  09/28/2007   Degenerative Disc Disease - surgery on  L3/4, L4/5  . EYE SURGERY  1975   left eye cataract surgery  . IUD REMOVAL  01/09/2012   Procedure: INTRAUTERINE DEVICE (IUD) REMOVAL;  Surgeon: Marvene Staff, MD;  Location: [facility];  Service: Gynecology;  Laterality: N/A;  . OVARIAN CYST REMOVAL  01/09/2012   Procedure: OVARIAN CYSTECTOMY;  Surgeon: Alanda Slim  Clint Bolder, MD;  Location: [facility];  Service: Gynecology;  Laterality: Right;  . right knee arthroscopic  09/24/2006  . TONSILLECTOMY    . VESICOVAGINAL FISTULA CLOSURE W/ TAH     for fibroids    Family History  Problem Relation Age of Onset  . Hypertension Mother   . Heart disease Mother   . Diabetes Mother   . Lupus Mother   . Dementia Mother   . CAD Father   . Heart disease Father   . Hypertension Brother   . Diabetes Brother   . Hypertension Sister   . Breast cancer Sister   . Heart disease Maternal Grandmother   . CVA Maternal Grandmother   . Colon cancer Neg Hx     Allergies  Allergen Reactions  . Shellfish Allergy Itching  . Advil [Ibuprofen]     Rapid heartbeat  . Aleve [Naproxen Sodium]     dizziness nausea  . Benadryl [Diphenhydramine] Hives and Other (See Comments)    fatigue  . Doxycycline Other (See Comments)    Kidney stones  . Lactose Intolerance (Gi) Diarrhea  .  Limbrel [Flavocoxid]     Diarrhea    . Mobic [Meloxicam]     vomiting   . Neosporin [Neomycin-Bacitracin Zn-Polymyx]     Causes infection  . Soma [Carisoprodol] Other (See Comments)    seizures  . Codeine Rash    Patient states that Codeine in liquid form causes her rash reaction.  But other forms of codeine are okay for patient to take.  . Latex Rash  . Morphine And Related Itching and Rash  . Penicillins Rash  . Prednisone Palpitations    Weight gain    Current Medications:   Current Outpatient Medications:  .  acetaminophen (TYLENOL) 500 MG tablet, Take 500 mg by mouth every 6 (six) hours as needed. Patient used this medication for pain., Disp: , Rfl:  .  amLODipine (NORVASC) 10 MG tablet, Take 1 tablet (10 mg total) by mouth daily., Disp: 90 tablet, Rfl: 2 .  Biotin 2.5 MG CAPS, Take 1 capsule by mouth daily., Disp: , Rfl:  .  Calcium Carbonate (CALCIUM 600 PO), Take 2 tablets by mouth daily., Disp: , Rfl:  .  carvedilol (COREG) 25 MG tablet, Take 1 tablet (25 mg total) by mouth 2 (two) times daily with a meal., Disp: 180 tablet, Rfl: 2 .  cetirizine (ZYRTEC) 10 MG tablet, Take 1 tablet (10 mg total) by mouth daily., Disp: 30 tablet, Rfl: 11 .  Cholecalciferol (VITAMIN D-3) 5000 units TABS, Takes one tablet daily., Disp: 30 tablet, Rfl:  .  Clobetasol & Clobetasol Emul 0.05 & 0.05 % MISC, Apply 1 application topically 2 (two) times daily as needed. , Disp: , Rfl:  .  CRANBERRY PO, Take 2 capsules by mouth 3 (three) times daily. , Disp: , Rfl:  .  diclofenac sodium (VOLTAREN) 1 % GEL, Apply 2 g topically 4 (four) times daily., Disp: , Rfl:  .  gabapentin (NEURONTIN) 300 MG capsule, Take 1 capsule (300 mg total) by mouth at bedtime., Disp: 90 capsule, Rfl: 2 .  hydroxychloroquine (PLAQUENIL) 200 MG tablet, Take 200 mg by mouth 2 (two) times daily., Disp: , Rfl:  .  levothyroxine (SYNTHROID) 75 MCG tablet, TAKE 1 TABLET(75 MCG) BY MOUTH DAILY, Disp: 90 tablet, Rfl: 2 .  losartan  (COZAAR) 100 MG tablet, Take 1 tablet (100 mg total) by mouth daily., Disp: 90 tablet, Rfl: 2 .  Multiple Vitamin (MULTIVITAMIN) tablet,  Take 2 tablets by mouth daily., Disp: , Rfl:  .  Omega-3 Fatty Acids (FISH OIL) 1000 MG CAPS, Take 1 capsule by mouth daily. , Disp: , Rfl:  .  potassium chloride (KLOR-CON) 10 MEQ tablet, Take 1 tablet (10 mEq total) by mouth 2 (two) times daily., Disp: 180 tablet, Rfl: 2 .  pravastatin (PRAVACHOL) 40 MG tablet, Take 1 tablet (40 mg total) by mouth daily., Disp: 90 tablet, Rfl: 2 .  Pyridoxine HCl (VITAMIN B6) 200 MG TABS, Take one tablet daily., Disp: 30 tablet, Rfl:  .  triamcinolone ointment (KENALOG) 0.5 %, Apply to affected area 1-2 x daily, Disp: 30 g, Rfl: 0 .  zonisamide (ZONEGRAN) 100 MG capsule, Take 400 mg by mouth daily. , Disp: , Rfl:  .  furosemide (LASIX) 40 MG tablet, Take 1.5 tablets (60 mg total) by mouth daily., Disp: 135 tablet, Rfl: 2   Review of Systems:   ROS  Negative unless otherwise specified per HPI.  Vitals:   Vitals:   12/11/20 1137  BP: (!) 150/80  Pulse: 86  Temp: 98.4 F (36.9 C)  TempSrc: Temporal  SpO2: 96%  Weight: 230 lb (104.3 kg)  Height: 5' 3"  (1.6 m)     Body mass index is 40.74 kg/m.  Physical Exam:   Physical Exam Vitals and nursing note reviewed.  Constitutional:      General: She is not in acute distress.    Appearance: She is well-developed. She is not ill-appearing, toxic-appearing or sickly-appearing.  Cardiovascular:     Rate and Rhythm: Normal rate and regular rhythm.     Pulses: Normal pulses.     Heart sounds: Normal heart sounds, S1 normal and S2 normal.     Comments: No LE edema Pulmonary:     Effort: Pulmonary effort is normal.     Breath sounds: Normal breath sounds.  Abdominal:     General: Abdomen is flat. Bowel sounds are normal.     Palpations: Abdomen is soft.     Tenderness: There is abdominal tenderness in the epigastric area. There is no right CVA tenderness, left CVA  tenderness, guarding or rebound.  Skin:    General: Skin is warm, dry and intact.  Neurological:     Mental Status: She is alert.     GCS: GCS eye subscore is 4. GCS verbal subscore is 5. GCS motor subscore is 6.  Psychiatric:        Mood and Affect: Mood and affect normal.        Speech: Speech normal.        Behavior: Behavior normal. Behavior is cooperative.       Assessment and Plan:   Lorali was seen today for blood in stools.  Diagnoses and all orders for this visit:  Epigastric pain She is quite tender on exam but no peritoneal signs. She is agreeeable to CT abd/pel for further evaluation. DDx includes: appendicitis, PUD, h pylori, pancreatitis, diverticulitis, viral gastroenteritis, among others. Urine and blood work updated today. Will treat appropriately based on results. Worsening precautions in the meantime. -     Lipase -     CBC with Differential/Platelet -     Comprehensive metabolic panel -     POCT Urinalysis Dipstick (Automated) -     H. pylori antibody, IgG -     CT Abdomen Pelvis W Contrast; Future   CMA or LPN served as scribe during this visit. History, Physical, and Plan performed by medical provider. The above  documentation has been reviewed and is accurate and complete.  Inda Coke, PA-C

## 2020-12-11 NOTE — Patient Instructions (Signed)
It was great to see you!  Labs and urine today.  CT abdomen pelvis ordered.   Abdominal Pain, Adult Many things can cause belly (abdominal) pain. Most times, belly pain is not dangerous. Many cases of belly pain can be watched and treated at home. Sometimes, though, belly pain is serious. Your doctor will try to find the cause of your belly pain. Follow these instructions at home: Medicines  Take over-the-counter and prescription medicines only as told by your doctor.  Do not take medicines that help you poop (laxatives) unless told by your doctor. General instructions  Watch your belly pain for any changes.  Drink enough fluid to keep your pee (urine) pale yellow.  Keep all follow-up visits as told by your doctor. This is important.   Contact a doctor if:  Your belly pain changes or gets worse.  You are not hungry, or you lose weight without trying.  You are having trouble pooping (constipated) or have watery poop (diarrhea) for more than 2-3 days.  You have pain when you pee or poop.  Your belly pain wakes you up at night.  Your pain gets worse with meals, after eating, or with certain foods.  You are vomiting and cannot keep anything down.  You have a fever.  You have blood in your pee. Get help right away if:  Your pain does not go away as soon as your doctor says it should.  You cannot stop vomiting.  Your pain is only in areas of your belly, such as the right side or the left lower part of the belly.  You have bloody or black poop, or poop that looks like tar.  You have very bad pain, cramping, or bloating in your belly.  You have signs of not having enough fluid or water in your body (dehydration), such as: ? Dark pee, very little pee, or no pee. ? Cracked lips. ? Dry mouth. ? Sunken eyes. ? Sleepiness. ? Weakness.  You have trouble breathing or chest pain. Summary  Many cases of belly pain can be watched and treated at home.  Watch your belly  pain for any changes.  Take over-the-counter and prescription medicines only as told by your doctor.  Contact a doctor if your belly pain changes or gets worse.  Get help right away if you have very bad pain, cramping, or bloating in your belly. This information is not intended to replace advice given to you by your health care provider. Make sure you discuss any questions you have with your health care provider. Document Revised: 03/15/2019 Document Reviewed: 03/15/2019 Elsevier Patient Education  2021 [place] [employer],  Inda Coke PA-C

## 2020-12-12 ENCOUNTER — Telehealth: Payer: Self-pay

## 2020-12-12 DIAGNOSIS — R1013 Epigastric pain: Secondary | ICD-10-CM

## 2020-12-12 DIAGNOSIS — K921 Melena: Secondary | ICD-10-CM

## 2020-12-12 NOTE — Telephone Encounter (Signed)
Spoke to pt told her I have placed the referral with [facility] with Dr. Collene Mares someone will contact her to schedule an appt. Asked pt if she went back to work today?  Pt said yes, but has been out of work since the 25 th. Told her I will do the work excuse and she can access it through her Mychart. Pt verbalized understanding. Note done and Referral.

## 2020-12-12 NOTE — Telephone Encounter (Signed)
Pt returned Sam's call about CT scan. Pt states she would like a referral placed to GI. Pt is requesting referral be sent to Dr. Collene Mares at [facility]. Pt also asked if Aldona Bar would write her a work note for yesterdays visit and CT. Please advise.

## 2020-12-13 ENCOUNTER — Telehealth: Payer: Self-pay

## 2020-12-13 NOTE — Telephone Encounter (Signed)
Please see triage note

## 2020-12-13 NOTE — Telephone Encounter (Signed)
Nurse Assessment Nurse: Raenette Rover, RN, Zella Ball Date/Time [name] Time): 12/13/2020 9:16:02 AM Confirm and document reason for call. If symptomatic, describe symptoms. ---Caller states that she had a bout of dizziness and feeling like she was about to pass out. She had to grab something to hold on to in order to not fall to the ground Occurred last night . Feels a little bit better this morning. Does the patient have any new or worsening symptoms? ---Yes Will a triage be completed? ---Yes Related visit to physician within the last 2 weeks? ---No Does the PT have any chronic conditions? (i.e. diabetes, asthma, this includes High risk factors for pregnancy, etc.) ---Yes List chronic conditions. ---htn Is this a behavioral health or substance abuse call? ---No Guidelines Guideline Title Affirmed Question Affirmed Notes Nurse Date/Time (Eastern Time) Rectal Bleeding [1] Constant abdominal pain AND [2] present > 2 hours Raenette Rover, RN, Zella Ball 12/13/2020 9:18:46 AM Disp. Time [name] Time) Disposition Final User 12/13/2020 9:15:11 AM Send to Urgent Ala Dach 12/13/2020 9:24:17 AM Go to ED Now Yes Raenette Rover, RN, Zella Ball PLEASE NOTE: All timestamps contained within this report are represented as [place] Standard Time. CONFIDENTIALTY NOTICE: This fax transmission is intended only for the addressee. It contains information that is legally privileged, confidential or otherwise protected from use or disclosure. If you are not the intended recipient, you are strictly prohibited from reviewing, disclosing, copying using or disseminating any of this information or taking any action in reliance on or regarding this information. If you have received this fax in error, please notify us immediately by telephone so that we can arrange for its return to [place]. Phone: [phone], Toll-Free: [phone], Fax: [phone] Page: 2 of 2 Call Id: [id number] [place] Disagree/Comply Comply Caller Understands  Yes PreDisposition Did not know what to do Care Advice Given Per Guideline GO TO ED NOW: BRING MEDICINES: CARE ADVICE given per Rectal Bleeding (Adult) guideline. Comments User: Wilson Singer, RN Date/Time [name] Time): 12/13/2020 9:19:33 AM Has had diarrhea for a few days resolved with some rectal bleeding and has a gi app coming up. Saw the dr for this Monday. User: Wilson Singer, RN Date/Time [name] Time): 12/13/2020 9:21:48 AM RLQ pain ever since last week. Began Wednesday of last week. User: Wilson Singer, RN Date/Time [name] Time): 12/13/2020 9:28:05 AM Caller states RLQ pain not with applying pressure to the area but more so when the pressure is removed. Referrals GO TO FACILITY OTHER - [facility]

## 2020-12-14 ENCOUNTER — Other Ambulatory Visit: Payer: 59

## 2020-12-14 DIAGNOSIS — Z20822 Contact with and (suspected) exposure to covid-19: Secondary | ICD-10-CM

## 2020-12-17 LAB — NOVEL CORONAVIRUS, NAA: SARS-CoV-2, NAA: NOT DETECTED

## 2020-12-17 LAB — SARS-COV-2, NAA 2 DAY TAT

## 2021-01-22 ENCOUNTER — Inpatient Hospital Stay: Admission: RE | Admit: 2021-01-22 | Payer: 59 | Source: Ambulatory Visit

## 2021-02-19 ENCOUNTER — Encounter (HOSPITAL_COMMUNITY): Payer: Self-pay

## 2021-02-19 ENCOUNTER — Emergency Department (HOSPITAL_COMMUNITY)
Admission: EM | Admit: 2021-02-19 | Discharge: 2021-02-19 | Disposition: A | Discharge status: Home / Self Care | Attending: Emergency Medicine | Admitting: Emergency Medicine

## 2021-02-19 ENCOUNTER — Other Ambulatory Visit: Payer: Self-pay

## 2021-02-19 DIAGNOSIS — I10 Essential (primary) hypertension: Secondary | ICD-10-CM | POA: Insufficient documentation

## 2021-02-19 DIAGNOSIS — S0993XA Unspecified injury of face, initial encounter: Secondary | ICD-10-CM | POA: Diagnosis present

## 2021-02-19 DIAGNOSIS — W010XXA Fall on same level from slipping, tripping and stumbling without subsequent striking against object, initial encounter: Secondary | ICD-10-CM | POA: Diagnosis not present

## 2021-02-19 DIAGNOSIS — Y9301 Activity, walking, marching and hiking: Secondary | ICD-10-CM | POA: Insufficient documentation

## 2021-02-19 DIAGNOSIS — E039 Hypothyroidism, unspecified: Secondary | ICD-10-CM | POA: Insufficient documentation

## 2021-02-19 DIAGNOSIS — S01511A Laceration without foreign body of lip, initial encounter: Secondary | ICD-10-CM | POA: Diagnosis not present

## 2021-02-19 DIAGNOSIS — Z79899 Other long term (current) drug therapy: Secondary | ICD-10-CM | POA: Insufficient documentation

## 2021-02-19 DIAGNOSIS — S0242XB Fracture of alveolus of maxilla, initial encounter for open fracture: Secondary | ICD-10-CM | POA: Diagnosis not present

## 2021-02-19 DIAGNOSIS — Y92481 Parking lot as the place of occurrence of the external cause: Secondary | ICD-10-CM | POA: Diagnosis not present

## 2021-02-19 DIAGNOSIS — Z91040 Latex allergy status: Secondary | ICD-10-CM | POA: Diagnosis not present

## 2021-02-19 NOTE — Discharge Instructions (Addendum)
Follow up with your dentist!  Call them this morning to be seen, they will want to try and align the teeth as soon as they can.   Call your doctor and see about your tetanus shot status.   Take 4 over the counter ibuprofen tablets 3 times a day or 2 over-the-counter naproxen tablets twice a day for pain. Also take tylenol 1000mg (2 extra strength) four times a day.

## 2021-02-19 NOTE — ED Provider Notes (Signed)
[facility] DEPT Provider Note   CSN: [id number] Arrival date & time: 02/19/21  0447     History Chief Complaint  Patient presents with  . Fall    Tammy Avery is a 59 y.o. female.  60 yo F with a chief complaints of a fall.  The patient had gotten off of work this morning and she tripped as she was walking to the parking lot to get to her car.  Landed on her mouth.  Complaining of pain especially to her front teeth.  She denies other area of injury.  Denies loss of consciousness denies confusion denies neck pain denies chest pain abdominal pain back pain extremity pain.  She does have some scrapes on her right hand.  Unsure of tetanus status.  The history is provided by the patient.  Fall This is a new problem. The current episode started less than 1 hour ago. The problem occurs constantly. The problem has not changed since onset.Pertinent negatives include no chest pain, no headaches and no shortness of breath. Nothing aggravates the symptoms. Nothing relieves the symptoms. She has tried nothing for the symptoms. The treatment provided no relief.       Past Medical History:  Diagnosis Date  . Anemia   . C. difficile diarrhea    2015 -- caught from mother  . Carpal tunnel syndrome   . Chronic migraine   . Congenital cataract    left eye, blind in left eye  . Connective tissue disease ([place])   . Diverticulitis    distal colon -- confrimed on CT on 01/16/16 and colonoscopy on 08/29/15  . H/O hiatal hernia    no meds - no problems  . Headache(784.0)    cannot tolerate many NSAIDs, would do injection of depomedrol in office  . HLA B27 (HLA B27 positive)   . Hyperlipidemia   . Hypertensive retinopathy   . Hypothyroidism    well controlled  . Kidney stones   . Memory loss    associated with peudotumor cerebri  . Obesity, morbid ([place])   . Osteoarthritis   . 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 Prediabetes   . Pseudotumor cerebri syndrome     on lasix  . Radiculopathy   . Seasonal allergies   . Seizures ([place])     on meds - last one 10/18/11- meds increased -- thinks of soma  . Sinusitis   . Sleep apnea    Does use CPAP  . Spondylarthritis   . Transient alteration of awareness   . Uterine fibroid   . Vitamin D deficiency     Patient Active Problem List   Diagnosis Date Noted  . Gallstones 09/22/2019  . Lymphadenopathy, abdominal 11/10/2018  . 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 Seizures ([place]) 04/30/2017  . Pseudotumor cerebri syndrome 04/30/2017  . Hypothyroidism 04/30/2017  . Chronic pain of left knee 12/09/2016  . Atypical chest pain 08/10/2015  . Essential hypertension 12/27/2013  . Hyperlipidemia 12/27/2013  . Morbid obesity ([place]) 12/27/2013  . Left anterior fascicular block 12/27/2013  . Prediabetes 12/27/2013    Past Surgical History:  Procedure Laterality Date  . BACK SURGERY  09/28/2007   Degenerative Disc Disease - surgery on  L3/4, L4/5  . EYE SURGERY  1975   left eye cataract surgery  . IUD REMOVAL  01/09/2012   Procedure: INTRAUTERINE DEVICE (IUD) REMOVAL;  Surgeon: Marvene Staff, MD;  Location: [facility];  Service: Gynecology;  Laterality: N/A;  . OVARIAN CYST REMOVAL  01/09/2012   Procedure: OVARIAN CYSTECTOMY;  Surgeon: Marvene Staff, MD;  Location: [facility];  Service: Gynecology;  Laterality: Right;  . right knee arthroscopic  09/24/2006  . TONSILLECTOMY    . VESICOVAGINAL FISTULA CLOSURE W/ TAH     for fibroids     OB History   No obstetric history on file.     Family History  Problem Relation Age of Onset  . Hypertension Mother   . Heart disease Mother   . Diabetes Mother   . Lupus Mother   . Dementia Mother   . CAD Father   . Heart disease Father   . Hypertension Brother   . Diabetes Brother   . Hypertension Sister   . Breast cancer Sister   . Heart disease Maternal Grandmother   . CVA Maternal Grandmother   . Colon cancer Neg Hx     Social History   Tobacco Use   . Smoking status: Never Smoker  . Smokeless tobacco: Never Used  Substance Use Topics  . Alcohol use: Yes    Alcohol/week: 1.0 standard drink    Types: 1 Glasses of wine per week  . Drug use: No    Home Medications Prior to Admission medications   Medication Sig Start Date End Date Taking? Authorizing Provider  acetaminophen (TYLENOL) 500 MG tablet Take 500 mg by mouth every 6 (six) hours as needed. Patient used this medication for pain.    [provider]  amLODipine (NORVASC) 10 MG tablet Take 1 tablet (10 mg total) by mouth daily. 08/29/20   Inda Coke, PA  Biotin 2.5 MG CAPS Take 1 capsule by mouth daily.    [provider]  Calcium Carbonate (CALCIUM 600 PO) Take 2 tablets by mouth daily.    [provider]  carvedilol (COREG) 25 MG tablet Take 1 tablet (25 mg total) by mouth 2 (two) times daily with a meal. 08/29/20   Inda Coke, PA  cetirizine (ZYRTEC) 10 MG tablet Take 1 tablet (10 mg total) by mouth daily. 04/29/17   Inda Coke, PA  Cholecalciferol (VITAMIN D-3) 5000 units TABS Takes one tablet daily. 04/29/17   Inda Coke, PA  Clobetasol & Clobetasol Emul 0.05 & 0.05 % MISC Apply 1 application topically 2 (two) times daily as needed.     [provider]  CRANBERRY PO Take 2 capsules by mouth 3 (three) times daily.     [provider]  diclofenac sodium (VOLTAREN) 1 % GEL Apply 2 g topically 4 (four) times daily. 04/29/17   Inda Coke, PA  furosemide (LASIX) 40 MG tablet Take 1.5 tablets (60 mg total) by mouth daily. 08/29/20 11/27/20  Inda Coke, PA  gabapentin (NEURONTIN) 300 MG capsule Take 1 capsule (300 mg total) by mouth at bedtime. 08/29/20   Inda Coke, PA  hydroxychloroquine (PLAQUENIL) 200 MG tablet Take 200 mg by mouth 2 (two) times daily.    [provider]  levothyroxine (SYNTHROID) 75 MCG tablet TAKE 1 TABLET(75 MCG) BY MOUTH DAILY 11/20/20   Inda Coke, PA  losartan  (COZAAR) 100 MG tablet Take 1 tablet (100 mg total) by mouth daily. 08/29/20   Inda Coke, PA  Multiple Vitamin (MULTIVITAMIN) tablet Take 2 tablets by mouth daily.    [provider]  Omega-3 Fatty Acids (FISH OIL) 1000 MG CAPS Take 1 capsule by mouth daily.     [provider]  potassium chloride (KLOR-CON) 10 MEQ tablet Take 1 tablet (10 mEq total) by mouth 2 (  two) times daily. 08/29/20   Inda Coke, PA  pravastatin (PRAVACHOL) 40 MG tablet Take 1 tablet (40 mg total) by mouth daily. 08/29/20   Inda Coke, PA  Pyridoxine HCl (VITAMIN B6) 200 MG TABS Take one tablet daily. 04/29/17   Inda Coke, PA  triamcinolone ointment (KENALOG) 0.5 % Apply to affected area 1-2 x daily 04/23/19   Inda Coke, PA  zonisamide (ZONEGRAN) 100 MG capsule Take 400 mg by mouth daily.     [provider]    Allergies    Shellfish allergy, Advil [ibuprofen], Aleve [naproxen sodium], Benadryl [diphenhydramine], Doxycycline, Lactose intolerance (gi), Limbrel [flavocoxid], Mobic [meloxicam], Neosporin [neomycin-bacitracin zn-polymyx], Soma [carisoprodol], Codeine, Latex, Morphine and related, Penicillins, and Prednisone  Review of Systems   Review of Systems  Constitutional: Negative for chills and fever.  HENT: Positive for dental problem. Negative for congestion and rhinorrhea.   Eyes: Negative for redness and visual disturbance.  Respiratory: Negative for shortness of breath and wheezing.   Cardiovascular: Negative for chest pain and palpitations.  Gastrointestinal: Negative for nausea and vomiting.  Genitourinary: Negative for dysuria and urgency.  Musculoskeletal: Negative for arthralgias and myalgias.  Skin: Negative for pallor and wound.  Neurological: Negative for dizziness and headaches.    Physical Exam Updated Vital Signs BP (!) 168/83 (BP Location: Right Arm)   Pulse 88   Temp 98 F (36.7 C) (Oral)   Resp 16   LMP 12/13/2011   SpO2 100%    Physical Exam Vitals and nursing note reviewed.  Constitutional:      General: She is not in acute distress.    Appearance: She is well-developed. She is not diaphoretic.  HENT:     Head: Normocephalic and atraumatic.     Mouth/Throat:   Eyes:     Pupils: Pupils are equal, round, and reactive to light.  Cardiovascular:     Rate and Rhythm: Normal rate and regular rhythm.     Heart sounds: No murmur heard. No friction rub. No gallop.   Pulmonary:     Effort: Pulmonary effort is normal.     Breath sounds: No wheezing or rales.  Abdominal:     General: There is no distension.     Palpations: Abdomen is soft.     Tenderness: There is no abdominal tenderness.  Musculoskeletal:        General: No tenderness.     Cervical back: Normal range of motion and neck supple.  Skin:    General: Skin is warm and dry.  Neurological:     Mental Status: She is alert and oriented to person, place, and time.  Psychiatric:        Behavior: Behavior normal.     ED Results / Procedures / Treatments   Labs (all labs ordered are listed, but only abnormal results are displayed) Labs Reviewed - No data to display  EKG None  Radiology No results found.  Procedures Procedures   Medications Ordered in ED Medications - No data to display  ED Course  I have reviewed the triage vital signs and the nursing notes.  Pertinent labs & imaging results that were available during my care of the patient were reviewed by me and considered in my medical decision making (see chart for details).    MDM Rules/Calculators/A&P                          60 yo F with a cc of a fall.  Patient landed on her mouth.  Alveolar ridge fracture clinically.  Will have her call her dentist this morning.  No suturable laceration to the lower lip.  Unsure of tetanus status but would prefer to check with her family doctor first.  5:20 AM:  I have discussed the diagnosis/risks/treatment options with the patient and  believe the pt to be eligible for discharge home to follow-up with Dentist. We also discussed returning to the ED immediately if new or worsening sx occur. We discussed the sx which are most concerning (e.g., sudden worsening pain, fever, inability to tolerate by mouth) that necessitate immediate return. Medications administered to the patient during their visit and any new prescriptions provided to the patient are listed below.  Medications given during this visit Medications - No data to display   The patient appears reasonably screen and/or stabilized for discharge and I doubt any other medical condition or other [facility] requiring further screening, evaluation, or treatment in the ED at this time prior to discharge.   Final Clinical Impression(s) / ED Diagnoses Final diagnoses:  Open fracture of alveolar process of maxilla, initial encounter [facility])  Lip laceration, initial encounter    Rx / DC Orders ED Discharge Orders    None       Deno Etienne, DO 02/19/21 2111

## 2021-02-19 NOTE — ED Notes (Signed)
Pt discharged from this ED in stable condition at this time. All discharge instructions and follow up care reviewed with pt with no further questions at this time. Pt ambulatory with steady gait, clear speech.  

## 2021-02-19 NOTE — ED Triage Notes (Signed)
Pt arrived via EMS, from home, was walking to car after work, mechanical fall. C/o facial pain. And some small cuts to bilateral hands. Denies any other pain, LOC or blood thinners.

## 2021-02-26 ENCOUNTER — Encounter: Payer: Self-pay | Admitting: Physician Assistant

## 2021-02-26 ENCOUNTER — Ambulatory Visit (INDEPENDENT_AMBULATORY_CARE_PROVIDER_SITE_OTHER): Payer: 59 | Admitting: Physician Assistant

## 2021-02-26 ENCOUNTER — Other Ambulatory Visit: Payer: Self-pay

## 2021-02-26 ENCOUNTER — Other Ambulatory Visit: Payer: Self-pay | Admitting: Physician Assistant

## 2021-02-26 VITALS — BP 130/76 | HR 84 | Temp 97.7°F | Ht 63.0 in | Wt 227.0 lb

## 2021-02-26 DIAGNOSIS — E039 Hypothyroidism, unspecified: Secondary | ICD-10-CM

## 2021-02-26 DIAGNOSIS — I10 Essential (primary) hypertension: Secondary | ICD-10-CM

## 2021-02-26 DIAGNOSIS — E785 Hyperlipidemia, unspecified: Secondary | ICD-10-CM

## 2021-02-26 DIAGNOSIS — M79641 Pain in right hand: Secondary | ICD-10-CM | POA: Diagnosis not present

## 2021-02-26 LAB — COMPREHENSIVE METABOLIC PANEL
ALT: 22 U/L (ref 0–35)
AST: 23 U/L (ref 0–37)
Albumin: 4.1 g/dL (ref 3.5–5.2)
Alkaline Phosphatase: 77 U/L (ref 39–117)
BUN: 14 mg/dL (ref 6–23)
CO2: 27 mEq/L (ref 19–32)
Calcium: 9.8 mg/dL (ref 8.4–10.5)
Chloride: 104 mEq/L (ref 96–112)
Creatinine, Ser: 0.89 mg/dL (ref 0.40–1.20)
GFR: 71.42 mL/min (ref >60.00)
Glucose, Bld: 61 mg/dL — ABNORMAL LOW (ref 70–99)
Potassium: 4.3 mEq/L (ref 3.5–5.1)
Sodium: 141 mEq/L (ref 135–145)
Total Bilirubin: 0.4 mg/dL (ref 0.2–1.2)
Total Protein: 6.9 g/dL (ref 6.0–8.3)

## 2021-02-26 LAB — CBC WITH DIFFERENTIAL/PLATELET
Basophils Absolute: 0 10*3/uL (ref 0.0–0.1)
Basophils Relative: 0.9 % (ref 0.0–3.0)
Eosinophils Absolute: 0.2 10*3/uL (ref 0.0–0.7)
Eosinophils Relative: 4.5 % (ref 0.0–5.0)
HCT: 40.9 % (ref 36.0–46.0)
Hemoglobin: 13.6 g/dL (ref 12.0–15.0)
Lymphocytes Relative: 32.4 % (ref 12.0–46.0)
Lymphs Abs: 1.5 10*3/uL (ref 0.7–4.0)
MCHC: 33.4 g/dL (ref 30.0–36.0)
MCV: 88 fl (ref 78.0–100.0)
Monocytes Absolute: 0.5 10*3/uL (ref 0.1–1.0)
Monocytes Relative: 11.3 % (ref 3.0–12.0)
Neutro Abs: 2.4 10*3/uL (ref 1.4–7.7)
Neutrophils Relative %: 50.9 % (ref 43.0–77.0)
Platelets: 255 10*3/uL (ref 150.0–400.0)
RBC: 4.64 Mil/uL (ref 3.87–5.11)
RDW: 13.5 % (ref 11.5–15.5)
WBC: 4.6 10*3/uL (ref 4.0–10.5)

## 2021-02-26 LAB — TSH: TSH: 3.03 u[IU]/mL (ref 0.35–4.50)

## 2021-02-26 MED ORDER — CARVEDILOL 25 MG PO TABS: 25.0000 mg | ORAL_TABLET | Freq: Two times a day (BID) | ORAL | 3 refills | Status: DC

## 2021-02-26 MED ORDER — LEVOTHYROXINE SODIUM 75 MCG PO TABS: ORAL_TABLET | ORAL | 1 refills | Status: DC

## 2021-02-26 MED ORDER — AMLODIPINE BESYLATE 10 MG PO TABS: 10.0000 mg | ORAL_TABLET | Freq: Every day | ORAL | 3 refills | Status: DC

## 2021-02-26 MED ORDER — LOSARTAN POTASSIUM 100 MG PO TABS: 100.0000 mg | ORAL_TABLET | Freq: Every day | ORAL | 3 refills | Status: DC

## 2021-02-26 MED ORDER — POTASSIUM CHLORIDE CRYS ER 10 MEQ PO TBCR: 10.0000 meq | EXTENDED_RELEASE_TABLET | Freq: Two times a day (BID) | ORAL | 3 refills | Status: DC

## 2021-02-26 MED ORDER — FUROSEMIDE 40 MG PO TABS: 60.0000 mg | ORAL_TABLET | Freq: Every day | ORAL | 3 refills | Status: DC

## 2021-02-26 MED ORDER — MUPIROCIN CALCIUM 2 % EX CREA: TOPICAL_CREAM | CUTANEOUS | 0 refills | Status: DC

## 2021-02-26 MED ORDER — PRAVASTATIN SODIUM 40 MG PO TABS: 40.0000 mg | ORAL_TABLET | Freq: Every day | ORAL | 3 refills | Status: DC

## 2021-02-26 NOTE — Patient Instructions (Signed)
It was great to see you!  Keep an eye on your blood pressure. If you feel like blood pressures are getting too low, please call me.  Use the topical ointment I have sent in your hand. If you have no improvement in pain by end of week, I want to get an xray.  I will refill medications based on lab results.  Let's follow-up in 6 months, sooner if you have concerns.  Take care,  Inda Coke PA-C

## 2021-02-26 NOTE — Progress Notes (Signed)
Tammy Avery is a 60 y.o. female is here for follow up.  I acted as a Education administrator for [employer], PA-C Anselmo Pickler, LPN   History of Present Illness:   Chief Complaint  Patient presents with  . Hypertension  . Hypothyroidism    HPI  Hypertension Pt currently taking Carvedilol 25 mg BID, Losartan 100 mg, Amlodipine 10 mg, Lasix 60 mg daily. Compliant with medications. Pt checking blood pressure few times a month. BP averaging 117-125, 75-80. Pt denies, dizziness, blurred vision, chest pain, SOB or lower leg edema. Pt has had some headaches this past week. Denies excessive caffeine intake, stimulant usage, excessive alcohol intake or increase in salt consumption.  BP Readings from Last 3 Encounters:  02/26/21 130/76  02/19/21 (!) 168/83  12/11/20 (!) 150/80     Hypothyroidism Pt currently taking Levothyroxine 75 mcg daily. Complaint with medication. Denies fatigue, no hair or nail changes, heat  intolerance, weight change, palpitations or nervousness. Pt says she is cold all the time.   HLD Takes pravastatin 40 mg daily. Tolerates well.   R hand pain She experienced fall on 02/19/21. She went to the ER and her mouth was fully evaluated in that regard. She did have a small abrasion to her lateral R hand and did not have work-up for this. She continues to have some mild pain. No active discharge or pain with moving fingers. Last TDap in 2018.    Health Maintenance Due  Topic Date Due  . MAMMOGRAM  07/30/2019    Past Medical History:  Diagnosis Date  . Anemia   . C. difficile diarrhea    2015 -- caught from mother  . Carpal tunnel syndrome   . Chronic migraine   . Congenital cataract    left eye, blind in left eye  . Connective tissue disease ([place])   . Diverticulitis    distal colon -- confrimed on CT on 01/16/16 and colonoscopy on 08/29/15  . H/O hiatal hernia    no meds - no problems  . Headache(784.0)    cannot tolerate many NSAIDs, would do injection of  depomedrol in office  . HLA B27 (HLA B27 positive)   . Hyperlipidemia   . Hypertensive retinopathy   . Hypothyroidism    well controlled  . Kidney stones   . Memory loss    associated with peudotumor cerebri  . Obesity, morbid ([place])   . Osteoarthritis   . 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 Prediabetes   . Pseudotumor cerebri syndrome    on lasix  . Radiculopathy   . Seasonal allergies   . Seizures ([place])     on meds - last one 10/18/11- meds increased -- thinks of soma  . Sinusitis   . Sleep apnea    Does use CPAP  . Spondylarthritis   . Transient alteration of awareness   . Uterine fibroid   . Vitamin D deficiency      Social History   Tobacco Use  . Smoking status: Never Smoker  . Smokeless tobacco: Never Used  Substance Use Topics  . Alcohol use: Yes    Alcohol/week: 1.0 standard drink    Types: 1 Glasses of wine per week  . Drug use: No    Past Surgical History:  Procedure Laterality Date  . BACK SURGERY  09/28/2007   Degenerative Disc Disease - surgery on  L3/4, L4/5  . EYE SURGERY  1975   left eye cataract surgery  . IUD REMOVAL  01/09/2012   Procedure: INTRAUTERINE DEVICE (IUD) REMOVAL;  Surgeon: Marvene Staff, MD;  Location: [facility];  Service: Gynecology;  Laterality: N/A;  . OVARIAN CYST REMOVAL  01/09/2012   Procedure: OVARIAN CYSTECTOMY;  Surgeon: Marvene Staff, MD;  Location: [facility];  Service: Gynecology;  Laterality: Right;  . right knee arthroscopic  09/24/2006  . TONSILLECTOMY    . VESICOVAGINAL FISTULA CLOSURE W/ TAH     for fibroids    Family History  Problem Relation Age of Onset  . Hypertension Mother   . Heart disease Mother   . Diabetes Mother   . Lupus Mother   . Dementia Mother   . CAD Father   . Heart disease Father   . Hypertension Brother   . Diabetes Brother   . Hypertension Sister   . Breast cancer Sister   . Heart disease Maternal Grandmother   . CVA Maternal Grandmother   . Colon cancer Neg Hx      PMHx, SurgHx, SocialHx, FamHx, Medications, and Allergies were reviewed in the Visit Navigator and updated as appropriate.   Patient Active Problem List   Diagnosis Date Noted  . Gallstones 09/22/2019  . Lymphadenopathy, abdominal 11/10/2018  . 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 Seizures ([place]) 04/30/2017  . Pseudotumor cerebri syndrome 04/30/2017  . Hypothyroidism 04/30/2017  . Chronic pain of left knee 12/09/2016  . Atypical chest pain 08/10/2015  . Essential hypertension 12/27/2013  . Hyperlipidemia 12/27/2013  . Morbid obesity ([place]) 12/27/2013  . Left anterior fascicular block 12/27/2013  . Prediabetes 12/27/2013    Social History   Tobacco Use  . Smoking status: Never Smoker  . Smokeless tobacco: Never Used  Substance Use Topics  . Alcohol use: Yes    Alcohol/week: 1.0 standard drink    Types: 1 Glasses of wine per week  . Drug use: No    Current Medications and Allergies:    Current Outpatient Medications:  .  acetaminophen (TYLENOL) 500 MG tablet, Take 500 mg by mouth every 6 (six) hours as needed. Patient used this medication for pain., Disp: , Rfl:  .  amLODipine (NORVASC) 10 MG tablet, Take 1 tablet (10 mg total) by mouth daily., Disp: 90 tablet, Rfl: 2 .  Biotin 2.5 MG CAPS, Take 1 capsule by mouth daily., Disp: , Rfl:  .  carvedilol (COREG) 25 MG tablet, Take 1 tablet (25 mg total) by mouth 2 (two) times daily with a meal., Disp: 180 tablet, Rfl: 2 .  cetirizine (ZYRTEC) 10 MG tablet, Take 1 tablet (10 mg total) by mouth daily., Disp: 30 tablet, Rfl: 11 .  chlorhexidine (PERIDEX) 0.12 % solution, SMARTSIG:1 Capful(s) By Mouth Every 12 Hours, Disp: , Rfl:  .  Cholecalciferol (VITAMIN D-3) 5000 units TABS, Takes one tablet daily., Disp: 30 tablet, Rfl:  .  Clobetasol & Clobetasol Emul 0.05 & 0.05 % MISC, Apply 1 application topically 2 (two) times daily as needed. , Disp: , Rfl:  .  CRANBERRY PO, Take 2 capsules by mouth 3 (three) times daily. , Disp: , Rfl:  .   diclofenac sodium (VOLTAREN) 1 % GEL, Apply 2 g topically 4 (four) times daily., Disp: , Rfl:  .  gabapentin (NEURONTIN) 300 MG capsule, Take 1 capsule (300 mg total) by mouth at bedtime., Disp: 90 capsule, Rfl: 2 .  hydroxychloroquine (PLAQUENIL) 200 MG tablet, Take 200 mg by mouth 2 (two) times daily., Disp: , Rfl:  .  levothyroxine (SYNTHROID) 75 MCG tablet, TAKE 1 TABLET(75 MCG) BY MOUTH  DAILY, Disp: 90 tablet, Rfl: 2 .  losartan (COZAAR) 100 MG tablet, Take 1 tablet (100 mg total) by mouth daily., Disp: 90 tablet, Rfl: 2 .  Multiple Vitamin (MULTIVITAMIN) tablet, Take 2 tablets by mouth daily., Disp: , Rfl:  .  mupirocin cream (BACTROBAN) 2 %, Apply to affected area 1-2 times daily, Disp: 15 g, Rfl: 0 .  potassium chloride (KLOR-CON) 10 MEQ tablet, Take 1 tablet (10 mEq total) by mouth 2 (two) times daily., Disp: 180 tablet, Rfl: 2 .  pravastatin (PRAVACHOL) 40 MG tablet, Take 1 tablet (40 mg total) by mouth daily., Disp: 90 tablet, Rfl: 2 .  Pyridoxine HCl (VITAMIN B6) 200 MG TABS, Take one tablet daily., Disp: 30 tablet, Rfl:  .  triamcinolone ointment (KENALOG) 0.5 %, Apply to affected area 1-2 x daily, Disp: 30 g, Rfl: 0 .  vitamin C (ASCORBIC ACID) 500 MG tablet, Take 500 mg by mouth daily., Disp: , Rfl:  .  zonisamide (ZONEGRAN) 100 MG capsule, Take 400 mg by mouth daily. , Disp: , Rfl:  .  furosemide (LASIX) 40 MG tablet, Take 1.5 tablets (60 mg total) by mouth daily., Disp: 135 tablet, Rfl: 2   Allergies  Allergen Reactions  . Shellfish Allergy Itching  . Advil [Ibuprofen]     Rapid heartbeat  . Aleve [Naproxen Sodium]     dizziness nausea  . Benadryl [Diphenhydramine] Hives and Other (See Comments)    fatigue  . Doxycycline Other (See Comments)    Kidney stones  . Lactose Intolerance (Gi) Diarrhea  . Limbrel [Flavocoxid]     Diarrhea    . Mobic [Meloxicam]     vomiting   . Neosporin [Neomycin-Bacitracin Zn-Polymyx]     Causes infection  . Soma [Carisoprodol] Other  (See Comments)    seizures  . Codeine Rash    Patient states that Codeine in liquid form causes her rash reaction.  But other forms of codeine are okay for patient to take.  . Latex Rash  . Morphine And Related Itching and Rash  . Penicillins Rash  . Prednisone Palpitations    Weight gain    Review of Systems   ROS  Negative unless otherwise specified per HPI.   Vitals:   Vitals:   02/26/21 0943  BP: 130/76  Pulse: 84  Temp: 97.7 F (36.5 C)  TempSrc: Temporal  SpO2: 98%  Weight: 227 lb (103 kg)  Height: 5' 3"  (1.6 m)     Body mass index is 40.21 kg/m.   Physical Exam:    Physical Exam Vitals and nursing note reviewed.  Constitutional:      General: She is not in acute distress.    Appearance: She is well-developed. She is not ill-appearing or toxic-appearing.  Cardiovascular:     Rate and Rhythm: Normal rate and regular rhythm.     Pulses: Normal pulses.     Heart sounds: Normal heart sounds, S1 normal and S2 normal.     Comments: No LE edema Pulmonary:     Effort: Pulmonary effort is normal.     Breath sounds: Normal breath sounds.  Musculoskeletal:     Comments: R lateral hand with 2 x small, superficial area of skin loss to proximal 5th finger. TTP. No decreased ROM. No active discharge or erythema.  Skin:    General: Skin is warm and dry.  Neurological:     Mental Status: She is alert.     GCS: GCS eye subscore is 4. GCS verbal subscore  is 5. GCS motor subscore is 6.  Psychiatric:        Speech: Speech normal.        Behavior: Behavior normal. Behavior is cooperative.      Assessment and Plan:    Tammy Avery was seen today for hypertension and hypothyroidism.  Diagnoses and all orders for this visit:  Essential hypertension Normotensive. Continue Carvedilol 25 mg BID, Losartan 100 mg, Amlodipine 10 mg, Lasix 60 mg daily. Follow-up in 6 months. Update CMP. -     CBC with Differential/Platelet -     Comprehensive metabolic  panel  Hyperlipidemia, unspecified hyperlipidemia type Lipids well controlled per chart review. Continue pravastatin 40 mg daily.  Hypothyroidism, unspecified type Update TSH today and adjust levothyroxine 75 mcg daily. -     TSH  Right hand pain No red flags on exam. Trial topical bactroban to see if this helps with further healing -- keep area covered during work. If no improvement x 1 week, will obtain imaging and consider referral. Patient in agreement. Worsening precautions advised.  Other orders -     mupirocin cream (BACTROBAN) 2 %; Apply to affected area 1-2 times daily    CMA or LPN served as scribe during this visit. History, Physical, and Plan performed by medical provider. The above documentation has been reviewed and is accurate and complete.   Inda Coke, PA-C [place] [place] 02/26/2021  Follow-up: No follow-ups on file.

## 2021-03-14 ENCOUNTER — Ambulatory Visit
Admission: RE | Admit: 2021-03-14 | Discharge: 2021-03-14 | Disposition: A | Discharge status: Home / Self Care | Payer: 59 | Source: Ambulatory Visit | Attending: Physician Assistant | Admitting: Physician Assistant

## 2021-03-14 ENCOUNTER — Other Ambulatory Visit: Payer: Self-pay

## 2021-03-14 DIAGNOSIS — Z1231 Encounter for screening mammogram for malignant neoplasm of breast: Secondary | ICD-10-CM

## 2021-04-02 ENCOUNTER — Telehealth: Payer: Self-pay

## 2021-04-02 NOTE — Telephone Encounter (Signed)
Pt called back told her she does need to quarantine. I see you are scheduled for COVID testing tomorrow at our [facility], need to quarantine till her test come back Neg and wear a mask. Pt verbalized understanding.

## 2021-04-02 NOTE — Telephone Encounter (Signed)
Left message on voicemail to call office.  

## 2021-04-02 NOTE — Telephone Encounter (Signed)
Patient lives with her sister.    Sister tested positive for COVID on 5/14.    States she has not been able to properly quarantine from sister since they both share common space.  States she took a home test on Saturday that came back negative.    States currently has sore throat.    Would like to know if she needs to quarantine as well?  I have advised patient to take a PCR test.

## 2021-04-03 ENCOUNTER — Ambulatory Visit: Payer: 59 | Attending: Critical Care Medicine

## 2021-04-03 DIAGNOSIS — Z20822 Contact with and (suspected) exposure to covid-19: Secondary | ICD-10-CM

## 2021-04-04 ENCOUNTER — Telehealth: Payer: Self-pay

## 2021-04-04 LAB — SARS-COV-2, NAA 2 DAY TAT

## 2021-04-04 LAB — NOVEL CORONAVIRUS, NAA: SARS-CoV-2, NAA: DETECTED — AB

## 2021-04-04 NOTE — Telephone Encounter (Signed)
Spoke to pt told her just sent her a My Chart message with information about COVID. Pt verbalized understanding.

## 2021-04-04 NOTE — Telephone Encounter (Signed)
Pt called stating she tested positive for COVID. Pt asked if nurse could give her a call on what OTC medications she can take. Please advise.

## 2021-04-09 ENCOUNTER — Telehealth: Payer: Self-pay

## 2021-04-09 NOTE — Telephone Encounter (Signed)
Ok to give note saying she can return to work if she meets the [employer] return to work guidelines.  Algis Greenhouse. [name], MD 04/09/2021 2:46 PM

## 2021-04-09 NOTE — Telephone Encounter (Signed)
Patient called in stating she tested positive for covid a few days ago and is wanting to return to work. Wondered if Dr.Parker or someone would be able to provide a work note in order to return to work. Did advise patient that if she did not have an appointment that more than likely we would not be able to provide a work note excusing the days she missed but would send a message to confirm.

## 2021-04-09 NOTE — Telephone Encounter (Signed)
Pt is needing a return to work note. She was Dx with COVID on  5/17 results are in [place]. Please advise if okay to give note. Pt was not seeen for illness.

## 2021-04-10 ENCOUNTER — Telehealth: Payer: Self-pay

## 2021-04-10 NOTE — Telephone Encounter (Signed)
Pt called stating she still has a cough from when she had covid. Pt asked if the nurse could recommend any OTC medications to take for her cough. Please advise.

## 2021-04-10 NOTE — Telephone Encounter (Signed)
Spoke to pt told her she can take OTC Delysm cough medication for her cough. Pt verbalized understanding.

## 2021-04-11 NOTE — Telephone Encounter (Signed)
Spoke with patient, patient stated feeling better went to work yesterday and does not need work note at this time

## 2021-05-16 ENCOUNTER — Other Ambulatory Visit: Payer: Self-pay | Admitting: Physician Assistant

## 2021-07-16 NOTE — Progress Notes (Incomplete)
Cardiology Office Note:    Date:  07/16/2021   ID:  [name], DOB May 23, 1961, MRN [id number]  PCP:  Inda Coke, PA  [facility] Cardiologist:  Candee Furbish, MD  [facility] Electrophysiologist:  None   Referring MD: Inda Coke, PA     History of Present Illness:    Tammy Avery is a 60 y.o. female with HTN and family history of heart disease here for follow-up of CAD and hypertension.  Premature family history of MI in mother at age 47 Nuclear stress test 2016 normal   Had some shortness of breath when walking uphill otherwise no chest pain.  Arthritis.  Has had back pain and cholelithiasis -- MRI in January 2020   Blood pressure much better controlled on Norvasc losartan and Lasix.  Averages of been 120/70.  No side effects.  She works at the post office.  120/70 at home.  She has had Covid vaccines.  Today,  She denies any palpitations, chest pain, or shortness of breath. No lightheadedness, headaches, syncope, orthopnea, or PND. Also has no lower extremity edema or exertional symptoms.   Past Medical History:  Diagnosis Date   Anemia    C. difficile diarrhea    2015 -- caught from mother   Carpal tunnel syndrome    Chronic migraine    Congenital cataract    left eye, blind in left eye   Connective tissue disease ([place])    Diverticulitis    distal colon -- confrimed on CT on 01/16/16 and colonoscopy on 08/29/15   H/O hiatal hernia    no meds - no problems   Headache(784.0)    cannot tolerate many NSAIDs, would do injection of depomedrol in office   HLA B27 (HLA B27 positive)    Hyperlipidemia    Hypertensive retinopathy    Hypothyroidism    well controlled   Kidney stones    Memory loss    associated with peudotumor cerebri   Obesity, morbid (HCC)    Osteoarthritis    Polyarthritis, 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HCC)    Prediabetes    Pseudotumor cerebri syndrome    on lasix   Radiculopathy    Seasonal allergies    Seizures ([place])     on meds  - last one 10/18/11- meds increased -- thinks of soma   Sinusitis    Sleep apnea    Does use CPAP   Spondylarthritis    Transient alteration of awareness    Uterine fibroid    Vitamin D deficiency     Past Surgical History:  Procedure Laterality Date   BACK SURGERY  09/28/2007   Degenerative Disc Disease - surgery on  L3/4, L4/5   EYE SURGERY  1975   left eye cataract surgery   IUD REMOVAL  01/09/2012   Procedure: INTRAUTERINE DEVICE (IUD) REMOVAL;  Surgeon: Marvene Staff, MD;  Location: [facility];  Service: Gynecology;  Laterality: N/A;   OVARIAN CYST REMOVAL  01/09/2012   Procedure: OVARIAN CYSTECTOMY;  Surgeon: Marvene Staff, MD;  Location: [facility];  Service: Gynecology;  Laterality: Right;   right knee arthroscopic  09/24/2006   TONSILLECTOMY     VESICOVAGINAL FISTULA CLOSURE W/ TAH     for fibroids    Current Medications: No outpatient medications have been marked as taking for the 07/18/21 encounter (Appointment) with [name], MD.     Allergies:   Shellfish allergy, Advil [ibuprofen], Aleve [naproxen sodium], Benadryl [diphenhydramine], Doxycycline, Lactose intolerance (gi), Limbrel [flavocoxid], Mobic [  meloxicam], Neosporin [neomycin-bacitracin zn-polymyx], Soma [carisoprodol], Codeine, Latex, Morphine and related, Penicillins, and Prednisone   Social History   Socioeconomic History   Marital status: Single    Spouse name: Not on file   Number of children: Not on file   Years of education: Not on file   Highest education level: Not on file  Occupational History   Not on file  Tobacco Use   Smoking status: Never   Smokeless tobacco: Never  Substance and Sexual Activity   Alcohol use: Yes    Alcohol/week: 1.0 standard drink    Types: 1 Glasses of wine per week   Drug use: No   Sexual activity: Never    Birth control/protection: None  Other Topics Concern   Not on file  Social History Narrative   Psychologist, prison and probation services   Single   No children    Fun: fantasy football ([employer], Doctor, general practice, Engineer, manufacturing systems)   Social Determinants of Radio broadcast assistant Strain: Not on file  Food Insecurity: Not on file  Transportation Needs: Not on file  Physical Activity: Not on file  Stress: Not on file  Social Connections: Not on file     Family History: The patient's family history includes Breast cancer in her sister; CAD in her father; CVA in her maternal grandmother; Dementia in her mother; Diabetes in her brother and mother; Heart disease in her father, maternal grandmother, and mother; Hypertension in her brother, mother, and sister; Lupus in her mother. There is no history of Colon cancer.  ROS:   Please see the history of present illness.    (+) All other systems reviewed and are negative.  EKGs/Labs/Other Studies Reviewed:    Lexiscan Myoview 08/17/2015: The left ventricular ejection fraction is normal (55-65%). Nuclear stress EF: 56%. There was no ST segment deviation noted during stress. The study is normal. This is a low risk study.   No evidence of ischemia or scar.   EKG:  EKG is personally reviewed and interpreted. 07/18/2021: *** 06/02/2020: sinus rhythm 76.   prior -  Sinus rhythm 68 LAFB with nonspecific ST-T wave changes  Recent Labs: 02/26/2021: ALT 22; BUN 14; Creatinine, Ser 0.89; Hemoglobin 13.6; Platelets 255.0; Potassium 4.3; Sodium 141; TSH 3.03  Recent Lipid Panel    Component Value Date/Time   CHOL 190 08/28/2020 0940   TRIG 84 08/28/2020 0940   HDL 89 08/28/2020 0940   CHOLHDL 2.1 08/28/2020 0940   VLDL 16.0 04/26/2019 0836   LDLCALC 84 08/28/2020 0940    Physical Exam:    VS:  LMP 12/13/2011     Wt Readings from Last 3 Encounters:  02/26/21 227 lb (103 kg)  12/11/20 230 lb (104.3 kg)  09/13/20 241 lb (109.3 kg)     GEN:  Well nourished, well developed in no acute distress HEENT: Normal NECK: No JVD; No carotid bruits LYMPHATICS: No lymphadenopathy CARDIAC: RRR, no murmurs, rubs,  gallops RESPIRATORY:  Clear to auscultation without rales, wheezing or rhonchi  ABDOMEN: Soft, non-tender, non-distended MUSCULOSKELETAL:  No edema; No deformity  SKIN: Warm and dry NEUROLOGIC:  Alert and oriented x 3 PSYCHIATRIC:  Normal affect   ASSESSMENT:    No diagnosis found.  PLAN:    In order of problems listed above: No problem-specific Assessment & Plan notes found for this encounter.   Essential hypertension - Much better control.  Medications reviewed as above.  Continue with weight loss.  At home blood pressures have been 120/70 for instance.  She is  logging these readings.  Excellent.  No changes made today.   Obstructive sleep apnea - Neurology following. Wearing mask off and on.   Morbid obesity -Continue to encourage weight loss.   She states that she is eating more salads.  Decrease carbohydrates.  Hyperlipidemia -LDL 92, hemoglobin A1c 5.6 hemoglobin 14.4 creatinine 0.9 from outside labs.  ALT 25.  Continue with pravastatin  Follow-up: ***  Medication Adjustments/Labs and Tests Ordered: Current medicines are reviewed at length with the patient today.  Concerns regarding medicines are outlined above.  No orders of the defined types were placed in this encounter.  No orders of the defined types were placed in this encounter.   There are no Patient Instructions on file for this visit.   I,Mathew Stumpf,acting as a Education administrator for [employer], MD.,have documented all relevant documentation on the behalf of Candee Furbish, MD,as directed by  Candee Furbish, MD while in the presence of Candee Furbish, MD.  ***  Signed, Madelin Rear  07/16/2021 1:28 PM    [place]

## 2021-07-18 ENCOUNTER — Ambulatory Visit: Payer: 59 | Admitting: Cardiology

## 2021-08-13 ENCOUNTER — Other Ambulatory Visit: Payer: Self-pay | Admitting: Family Medicine

## 2021-08-25 ENCOUNTER — Other Ambulatory Visit: Payer: Self-pay | Admitting: Physician Assistant

## 2021-11-18 ENCOUNTER — Other Ambulatory Visit: Payer: Self-pay | Admitting: Physician Assistant

## 2021-11-23 ENCOUNTER — Encounter: Payer: Self-pay | Admitting: Cardiology

## 2021-11-23 ENCOUNTER — Other Ambulatory Visit: Payer: Self-pay

## 2021-11-23 ENCOUNTER — Ambulatory Visit (INDEPENDENT_AMBULATORY_CARE_PROVIDER_SITE_OTHER): Payer: 59 | Admitting: Cardiology

## 2021-11-23 DIAGNOSIS — Z8249 Family history of ischemic heart disease and other diseases of the circulatory system: Secondary | ICD-10-CM

## 2021-11-23 DIAGNOSIS — G4733 Obstructive sleep apnea (adult) (pediatric): Secondary | ICD-10-CM

## 2021-11-23 DIAGNOSIS — M25562 Pain in left knee: Secondary | ICD-10-CM

## 2021-11-23 DIAGNOSIS — I10 Essential (primary) hypertension: Secondary | ICD-10-CM | POA: Diagnosis not present

## 2021-11-23 DIAGNOSIS — E7800 Pure hypercholesterolemia, unspecified: Secondary | ICD-10-CM

## 2021-11-23 DIAGNOSIS — G8929 Other chronic pain: Secondary | ICD-10-CM

## 2021-11-23 DIAGNOSIS — I444 Left anterior fascicular block: Secondary | ICD-10-CM | POA: Diagnosis not present

## 2021-11-23 NOTE — Patient Instructions (Signed)
Medication Instructions:  The current medical regimen is effective;  continue present plan and medications.  *If you need a refill on your cardiac medications before your next appointment, please call your pharmacy*  Testing/Procedures: Your physician has requested that you have Coronary Calcium Score which is completed by CT scan. Cardiac computed tomography (CT) is a painless test that uses an x-ray machine to take clear, detailed pictures of your heart.  This testing is completed here at our office for the cost of $99, due at the time of the appointment.  There are no instructions for this test.  Follow-Up: At [facility], you and your health needs are our priority.  As part of our continuing mission to provide you with exceptional heart care, we have created designated Provider Care Teams.  These Care Teams include your primary Cardiologist (physician) and Advanced Practice Providers (APPs -  Physician Assistants and Nurse Practitioners) who all work together to provide you with the care you need, when you need it.  We recommend signing up for the patient portal called "MyChart".  Sign up information is provided on this After Visit Summary.  MyChart is used to connect with patients for Virtual Visits (Telemedicine).  Patients are able to view lab/test results, encounter notes, upcoming appointments, etc.  Non-urgent messages can be sent to your provider as well.   To learn more about what you can do with MyChart, go to [email or web address].    Your next appointment:   1 year(s)  The format for your next appointment:   In Person  Provider:   Candee Furbish, MD     Thank you for choosing [facility]!!

## 2021-11-23 NOTE — Assessment & Plan Note (Signed)
Blood pressure doing well.  Continue with current medication strategy.  Amlodipine 10 mg a day carvedilol 25 mg twice a day Lasix 60 mg a day losartan 100 mg a day.  No changes in medical management.

## 2021-11-23 NOTE — Assessment & Plan Note (Signed)
Mother and father both had massive heart attacks.  Grandmother had stroke.  We will go ahead and check a coronary calcium score.  If abnormal, we will transition from pravastatin 40 mg over to Crestor 20 mg and repeat lipid panel in 2 months.

## 2021-11-23 NOTE — Assessment & Plan Note (Signed)
She has been holding off on surgery for this.

## 2021-11-23 NOTE — Progress Notes (Signed)
Cardiology Office Note:    Date:  11/23/2021   ID:  Tammy Avery, DOB [birth year], MRN [id number]  PCP:  Inda Coke, PA   [facility] Providers Cardiologist:  Candee Furbish, MD Cardiology APP:  Sharmon [place]vere     Referring MD: Inda Coke, [place]    History of Present Illness:    Tammy Avery is a 61 y.o. female here for follow-up family history of heart disease.  Mother had MI at age 19.  Stress test 2016 normal.  Previously had some shortness of breath when walking uphill otherwise no significant chest pain.  Prior cholelithiasis with MRI in 2020.  On blood pressure medications Norvasc losartan light Lasix.  Doing well at home.  Works at the post [employer].  [place] yesterday, neurology.  Blood work reviewed.  Unremarkable.  Past Medical History:  Diagnosis Date   Anemia    C. difficile diarrhea    2015 -- caught from mother   Carpal tunnel syndrome    Chronic migraine    Congenital cataract    left eye, blind in left eye   Connective tissue disease ([place])    Diverticulitis    distal colon -- confrimed on CT on 01/16/16 and colonoscopy on 08/29/15   H/O hiatal hernia    no meds - no problems   Headache(784.0)    cannot tolerate many NSAIDs, would do injection of depomedrol in office   HLA B27 (HLA B27 positive)    Hyperlipidemia    Hypertensive retinopathy    Hypothyroidism    well controlled   Kidney stones    Memory loss    associated with peudotumor cerebri   Obesity, morbid (HCC)    Osteoarthritis    Polyarthritis, 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HCC)    Prediabetes    Pseudotumor cerebri syndrome    on lasix   Radiculopathy    Seasonal allergies    Seizures ([place])     on meds - last one 10/18/11- meds increased -- thinks of soma   Sinusitis    Sleep apnea    Does use CPAP   Spondylarthritis    Transient alteration of awareness    Uterine fibroid    Vitamin D deficiency     Past Surgical History:  Procedure Laterality Date   BACK SURGERY   09/28/2007   Degenerative Disc Disease - surgery on  L3/4, L4/5   EYE SURGERY  1975   left eye cataract surgery   IUD REMOVAL  01/09/2012   Procedure: INTRAUTERINE DEVICE (IUD) REMOVAL;  Surgeon: Marvene Staff, MD;  Location: [facility];  Service: Gynecology;  Laterality: N/A;   OVARIAN CYST REMOVAL  01/09/2012   Procedure: OVARIAN CYSTECTOMY;  Surgeon: Marvene Staff, MD;  Location: [facility];  Service: Gynecology;  Laterality: Right;   right knee arthroscopic  09/24/2006   TONSILLECTOMY     VESICOVAGINAL FISTULA CLOSURE W/ TAH     for fibroids    Current Medications: Current Meds  Medication Sig   acetaminophen (TYLENOL) 500 MG tablet Take 500 mg by mouth every 6 (six) hours as needed. Patient used this medication for pain.   amLODipine (NORVASC) 10 MG tablet Take 1 tablet (10 mg total) by mouth daily.   Biotin 2.5 MG CAPS Take 1 capsule by mouth daily.   carvedilol (COREG) 25 MG tablet Take 1 tablet (25 mg total) by mouth 2 (two) times daily with a meal.   cetirizine (ZYRTEC) 10 MG tablet Take 1 tablet (10  mg total) by mouth daily.   chlorhexidine (PERIDEX) 0.12 % solution SMARTSIG:1 Capful(s) By Mouth Every 12 Hours   Cholecalciferol (VITAMIN D-3) 5000 units TABS Takes one tablet daily.   Clobetasol & Clobetasol Emul 0.05 & 0.05 % MISC Apply 1 application topically 2 (two) times daily as needed.    CRANBERRY PO Take 2 capsules by mouth 3 (three) times daily.    diclofenac sodium (VOLTAREN) 1 % GEL Apply 2 g topically 4 (four) times daily.   furosemide (LASIX) 40 MG tablet Take 1.5 tablets (60 mg total) by mouth daily.   gabapentin (NEURONTIN) 300 MG capsule TAKE 1 CAPSULE(300 MG) BY MOUTH AT BEDTIME   hydroxychloroquine (PLAQUENIL) 200 MG tablet Take 200 mg by mouth 2 (two) times daily.   levothyroxine (SYNTHROID) 75 MCG tablet TAKE 1 TABLET(75 MCG) BY MOUTH DAILY   losartan (COZAAR) 100 MG tablet Take 1 tablet (100 mg total) by mouth daily.   Multiple Vitamin (MULTIVITAMIN)  tablet Take 2 tablets by mouth daily.   mupirocin cream (BACTROBAN) 2 % Apply to affected area 1-2 times daily   potassium chloride (KLOR-CON) 10 MEQ tablet Take 1 tablet (10 mEq total) by mouth 2 (two) times daily.   pravastatin (PRAVACHOL) 40 MG tablet Take 1 tablet (40 mg total) by mouth daily.   Pyridoxine HCl (VITAMIN B6) 200 MG TABS Take one tablet daily.   triamcinolone ointment (KENALOG) 0.5 % Apply to affected area 1-2 x daily   vitamin C (ASCORBIC ACID) 500 MG tablet Take 500 mg by mouth daily.   zonisamide (ZONEGRAN) 100 MG capsule Take 400 mg by mouth daily.      Allergies:   Shellfish allergy, Advil [ibuprofen], Aleve [naproxen sodium], Benadryl [diphenhydramine], Doxycycline, Lactose intolerance (gi), Limbrel [flavocoxid], Mobic [meloxicam], Neosporin [neomycin-bacitracin zn-polymyx], Soma [carisoprodol], Codeine, Latex, Morphine and related, Penicillins, and Prednisone   Social History   Socioeconomic History   Marital status: Single    Spouse name: Not on file   Number of children: Not on file   Years of education: Not on file   Highest education level: Not on file  Occupational History   Not on file  Tobacco Use   Smoking status: Never   Smokeless tobacco: Never  Substance and Sexual Activity   Alcohol use: Yes    Alcohol/week: 1.0 standard drink    Types: 1 Glasses of wine per week   Drug use: No   Sexual activity: Never    Birth control/protection: None  Other Topics Concern   Not on file  Social History Narrative   Psychologist, 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 football ([employer], Doctor, general practice, Engineer, manufacturing systems)   Social Determinants of Radio broadcast assistant Strain: Not on file  Food Insecurity: Not on file  Transportation Needs: Not on file  Physical Activity: Not on file  Stress: Not on file  Social Connections: Not on file     Family History: The patient's family history includes Breast cancer in her sister; CAD in her father; CVA in her  maternal grandmother; Dementia in her mother; Diabetes in her brother and mother; Heart disease in her father, maternal grandmother, and mother; Hypertension in her brother, mother, and sister; Lupus in her mother. There is no history of Colon cancer.  ROS:   Please see the history of present illness.     All other systems reviewed and are negative.  EKGs/Labs/Other Studies Reviewed:    EKG:  EKG is  ordered today.  The ekg ordered today demonstrates sinus rhythm 71 left anterior fascicular block  Recent Labs: 02/26/2021: ALT 22; BUN 14; Creatinine, Ser 0.89; Hemoglobin 13.6; Platelets 255.0; Potassium 4.3; Sodium 141; TSH 3.03  Recent Lipid Panel    Component Value Date/Time   CHOL 190 08/28/2020 0940   TRIG 84 08/28/2020 0940   HDL 89 08/28/2020 0940   CHOLHDL 2.1 08/28/2020 0940   VLDL 16.0 04/26/2019 0836   LDLCALC 84 08/28/2020 0940     Risk Assessment/Calculations:              Physical Exam:    VS:  BP 112/70 (BP Location: Left Arm, Patient Position: Sitting, Cuff Size: Large)    Pulse 71    Ht _0  (1.6 m)    Wt 234 lb (106.1 kg)    LMP 12/13/2011    SpO2 98%    BMI 41.45 kg/m     Wt Readings from Last 3 Encounters:  11/23/21 234 lb (106.1 kg)  02/26/21 227 lb (103 kg)  12/11/20 230 lb (104.3 kg)     GEN:  Well nourished, well developed in no acute distress HEENT: Normal NECK: No JVD; No carotid bruits LYMPHATICS: No lymphadenopathy CARDIAC: RRR, no murmurs, no rubs, gallops RESPIRATORY:  Clear to auscultation without rales, wheezing or rhonchi  ABDOMEN: Soft, non-tender, non-distended MUSCULOSKELETAL:  No edema; No deformity  SKIN: Warm and dry NEUROLOGIC:  Alert and oriented x 3 PSYCHIATRIC:  Normal affect   ASSESSMENT:    1. Essential hypertension   2. Left anterior fascicular block   3. Morbid obesity ([place])   4. Pure hypercholesterolemia   5. Obstructive sleep apnea   6. Family history of early CAD   61. Chronic pain of left knee    PLAN:     In order of problems listed above:  Essential hypertension Blood pressure doing well.  Continue with current medication strategy.  Amlodipine 10 mg a day carvedilol 25 mg twice a day Lasix 60 mg a day losartan 100 mg a day.  No changes in medical management.  Left anterior fascicular block No higher symptoms such as syncope.  Doing well.  Morbid obesity Continue to encourage weight loss.  Try carbohydrate reduction.  Mediterranean diet.  Hyperlipidemia Currently on pravastatin 40 mg a day.  Overall doing well.  LDL 92 on prior check.  Hemoglobin A1c previously 5.6.  Normal liver function.  Continue with current medication management.  Obstructive sleep apnea Neurology has been following.  In the past has had mask wearing off and on.  Family history of early CAD Mother and father both had massive heart attacks.  Grandmother had stroke.  We will go ahead and check a coronary calcium score.  If abnormal, we will transition from pravastatin 40 mg over to Crestor 20 mg and repeat lipid panel in 2 months.  Chronic pain of left knee She has been holding off on surgery for this.       Medication Adjustments/Labs and Tests Ordered: Current medicines are reviewed at length with the patient today.  Concerns regarding medicines are outlined above.  Orders Placed This Encounter  Procedures   CT CARDIAC SCORING (SELF PAY ONLY)   EKG 12-Lead   No orders of the defined types were placed in this encounter.   Patient Instructions  Medication Instructions:  The current medical regimen is effective;  continue present plan and medications.  *If you need a refill on your cardiac medications before your next appointment, please call your  pharmacy*  Testing/Procedures: Your physician has requested that you have Coronary Calcium Score which is completed by CT scan. Cardiac computed tomography (CT) is a painless test that uses an x-ray machine to take clear, detailed pictures of your heart.   This testing is completed here at our office for the cost of $99, due at the time of the appointment.  There are no instructions for this test.  Follow-Up: At [facility], you and your health needs are our priority.  As part of our continuing mission to provide you with exceptional heart care, we have created designated Provider Care Teams.  These Care Teams include your primary Cardiologist (physician) and Advanced Practice Providers (APPs -  Physician Assistants and Nurse Practitioners) who all work together to provide you with the care you need, when you need it.  We recommend signing up for the patient portal called "MyChart".  Sign up information is provided on this After Visit Summary.  MyChart is used to connect with patients for Virtual Visits (Telemedicine).  Patients are able to view lab/test results, encounter notes, upcoming appointments, etc.  Non-urgent messages can be sent to your provider as well.   To learn more about what you can do with MyChart, go to [email or web address].    Your next appointment:   1 year(s)  The format for your next appointment:   In Person  Provider:   Candee Furbish, MD     Thank you for choosing [facility]!!      Signed, Candee Furbish, MD  11/23/2021 8:49 AM    [place]

## 2021-11-23 NOTE — Assessment & Plan Note (Signed)
No higher symptoms such as syncope.  Doing well.

## 2021-11-23 NOTE — Assessment & Plan Note (Signed)
Neurology has been following.  In the past has had mask wearing off and on.

## 2021-11-23 NOTE — Assessment & Plan Note (Signed)
Continue to encourage weight loss.  Try carbohydrate reduction.  Mediterranean diet.

## 2021-11-23 NOTE — Assessment & Plan Note (Signed)
Currently on pravastatin 40 mg a day.  Overall doing well.  LDL 92 on prior check.  Hemoglobin A1c previously 5.6.  Normal liver function.  Continue with current medication management.

## 2021-11-28 ENCOUNTER — Other Ambulatory Visit: Payer: Self-pay | Admitting: Physician Assistant

## 2021-12-20 ENCOUNTER — Other Ambulatory Visit: Payer: Self-pay

## 2021-12-20 ENCOUNTER — Ambulatory Visit (INDEPENDENT_AMBULATORY_CARE_PROVIDER_SITE_OTHER)
Admission: RE | Admit: 2021-12-20 | Discharge: 2021-12-20 | Disposition: A | Discharge status: Home / Self Care | Payer: Self-pay | Source: Ambulatory Visit | Attending: Cardiology | Admitting: Cardiology

## 2021-12-20 ENCOUNTER — Other Ambulatory Visit: Payer: Self-pay | Admitting: *Deleted

## 2021-12-20 DIAGNOSIS — Z8249 Family history of ischemic heart disease and other diseases of the circulatory system: Secondary | ICD-10-CM

## 2021-12-20 DIAGNOSIS — E7800 Pure hypercholesterolemia, unspecified: Secondary | ICD-10-CM

## 2021-12-20 DIAGNOSIS — I10 Essential (primary) hypertension: Secondary | ICD-10-CM

## 2021-12-20 MED ORDER — ROSUVASTATIN CALCIUM 20 MG PO TABS
20.0000 mg | ORAL_TABLET | Freq: Every day | ORAL | 3 refills | Status: DC
Start: 2021-12-20 — End: 2023-03-07

## 2021-12-27 ENCOUNTER — Telehealth: Payer: Self-pay | Admitting: Cardiology

## 2021-12-27 NOTE — Telephone Encounter (Signed)
[name], MD  You 59 minutes ago (9:52 AM)   Lets try a statin holiday for 2 weeks and then restart.  Keep an eye on symptoms.  Let us know how it goes.     Tammy Avery is aware to stop Crestor for 2 weeks - then restart to see if symptoms re-occur.  She will call back if no improvement.

## 2021-12-27 NOTE — Telephone Encounter (Signed)
Pt c/o medication issue:  1. Name of Medication: rosuvastatin (CRESTOR) 20 MG tablet  2. How are you currently taking this medication (dosage and times per day)? Take 1 tablet (20 mg total) by mouth daily.   3. Are you having a reaction (difficulty breathing--STAT)? Yes   4. What is your medication issue? Meidication makes patient very sleey and tired to the fact where she has been sleeping in work and dozing off while driving

## 2021-12-27 NOTE — Telephone Encounter (Signed)
Will forward to Dr Marlou Porch for review

## 2022-02-18 ENCOUNTER — Other Ambulatory Visit: Payer: 59

## 2022-02-19 ENCOUNTER — Other Ambulatory Visit: Payer: Self-pay | Admitting: Physician Assistant

## 2022-02-25 ENCOUNTER — Other Ambulatory Visit: Payer: 59 | Admitting: *Deleted

## 2022-02-25 DIAGNOSIS — E7800 Pure hypercholesterolemia, unspecified: Secondary | ICD-10-CM

## 2022-02-25 LAB — ALT: ALT: 27 IU/L (ref 0–32)

## 2022-03-02 LAB — LIPID PANEL W/O CHOL/HDL RATIO
Cholesterol, Total: 161 mg/dL (ref 100–199)
HDL: 91 mg/dL (ref >39)
LDL Chol Calc (NIH): 59 mg/dL (ref 0–99)
Triglycerides: 49 mg/dL (ref 0–149)
VLDL Cholesterol Cal: 11 mg/dL (ref 5–40)

## 2022-03-02 LAB — SPECIMEN STATUS REPORT

## 2022-05-02 ENCOUNTER — Other Ambulatory Visit: Payer: Self-pay | Admitting: Physician Assistant

## 2022-05-02 DIAGNOSIS — Z1231 Encounter for screening mammogram for malignant neoplasm of breast: Secondary | ICD-10-CM

## 2022-05-06 ENCOUNTER — Ambulatory Visit
Admission: RE | Admit: 2022-05-06 | Discharge: 2022-05-06 | Disposition: A | Discharge status: Home / Self Care | Payer: 59 | Source: Ambulatory Visit | Attending: Physician Assistant | Admitting: Physician Assistant

## 2022-05-06 DIAGNOSIS — Z1231 Encounter for screening mammogram for malignant neoplasm of breast: Secondary | ICD-10-CM

## 2022-05-08 ENCOUNTER — Encounter: Payer: Self-pay | Admitting: Physician Assistant

## 2022-05-08 ENCOUNTER — Ambulatory Visit (INDEPENDENT_AMBULATORY_CARE_PROVIDER_SITE_OTHER): Payer: 59 | Admitting: Physician Assistant

## 2022-05-08 VITALS — BP 140/80 | HR 75 | Temp 97.9°F | Ht 63.0 in | Wt 235.4 lb

## 2022-05-08 DIAGNOSIS — E039 Hypothyroidism, unspecified: Secondary | ICD-10-CM

## 2022-05-08 DIAGNOSIS — E669 Obesity, unspecified: Secondary | ICD-10-CM | POA: Diagnosis not present

## 2022-05-08 DIAGNOSIS — I10 Essential (primary) hypertension: Secondary | ICD-10-CM | POA: Diagnosis not present

## 2022-05-08 DIAGNOSIS — G629 Polyneuropathy, unspecified: Secondary | ICD-10-CM

## 2022-05-08 LAB — COMPREHENSIVE METABOLIC PANEL
ALT: 40 U/L — ABNORMAL HIGH (ref 0–35)
AST: 33 U/L (ref 0–37)
Albumin: 4 g/dL (ref 3.5–5.2)
Alkaline Phosphatase: 77 U/L (ref 39–117)
BUN: 13 mg/dL (ref 6–23)
CO2: 29 mEq/L (ref 19–32)
Calcium: 9.3 mg/dL (ref 8.4–10.5)
Chloride: 107 mEq/L (ref 96–112)
Creatinine, Ser: 0.86 mg/dL (ref 0.40–1.20)
GFR: 73.79 mL/min (ref >60.00)
Glucose, Bld: 84 mg/dL (ref 70–99)
Potassium: 3.6 mEq/L (ref 3.5–5.1)
Sodium: 141 mEq/L (ref 135–145)
Total Bilirubin: 0.4 mg/dL (ref 0.2–1.2)
Total Protein: 7 g/dL (ref 6.0–8.3)

## 2022-05-08 LAB — CBC WITH DIFFERENTIAL/PLATELET
Basophils Absolute: 0 10*3/uL (ref 0.0–0.1)
Basophils Relative: 1.1 % (ref 0.0–3.0)
Eosinophils Absolute: 0.2 10*3/uL (ref 0.0–0.7)
Eosinophils Relative: 6.8 % — ABNORMAL HIGH (ref 0.0–5.0)
HCT: 40 % (ref 36.0–46.0)
Hemoglobin: 13 g/dL (ref 12.0–15.0)
Lymphocytes Relative: 34.4 % (ref 12.0–46.0)
Lymphs Abs: 1.3 10*3/uL (ref 0.7–4.0)
MCHC: 32.5 g/dL (ref 30.0–36.0)
MCV: 89.2 fl (ref 78.0–100.0)
Monocytes Absolute: 0.3 10*3/uL (ref 0.1–1.0)
Monocytes Relative: 9.5 % (ref 3.0–12.0)
Neutro Abs: 1.8 10*3/uL (ref 1.4–7.7)
Neutrophils Relative %: 48.2 % (ref 43.0–77.0)
Platelets: 207 10*3/uL (ref 150.0–400.0)
RBC: 4.48 Mil/uL (ref 3.87–5.11)
RDW: 13.6 % (ref 11.5–15.5)
WBC: 3.7 10*3/uL — ABNORMAL LOW (ref 4.0–10.5)

## 2022-05-08 LAB — MICROALBUMIN / CREATININE URINE RATIO
Creatinine,U: 41.2 mg/dL
Microalb Creat Ratio: 1.7 mg/g (ref 0.0–30.0)
Microalb, Ur: <0.7 mg/dL (ref 0.0–1.9)

## 2022-05-08 LAB — HEMOGLOBIN A1C: Hgb A1c MFr Bld: 5.6 % (ref 4.6–6.5)

## 2022-05-08 LAB — TSH: TSH: 2.22 u[IU]/mL (ref 0.35–5.50)

## 2022-05-08 NOTE — Progress Notes (Signed)
Tammy Avery is a 61 y.o. female here for a follow up of a pre-existing problem.  History of Present Illness:   Chief Complaint  Patient presents with   Hypertension    Pt has been checking Bp at home averaging systolic 110-123, diastolic 56-76.    HPI  HTN Currently taking Carvedilol 25 mg twice daily, Amlodipine 10 mg, Losartan 100 mg and Lasix 60 mg daily. At home blood pressure readings are: checked regularly. States BP averaging 110-123 systolic and 56-76 diastolic. States she had one time low blood pressure of 73/56. States she was feeling bad during that day and checked her blood pressure around 1 pm. Patient denies chest pain, SOB, blurred vision, dizziness, unusual headaches, lower leg swelling. Patient is  compliant with medication. Denies excessive caffeine intake, stimulant usage, excessive alcohol intake, or increase in salt consumption.  BP Readings from Last 3 Encounters:  05/08/22 140/80  11/23/21 112/70  02/26/21 130/76    Hypothyroidism  Patient is currently taking Levothyroxine 75 mcg daily. States she is complaint with her medication. Denies significant unexplained changes in tolerance to temperature or weight changes. Denies any concerning sx. She would like to update her blood work today.   HLD  States she was recently switched from Pravastatin 40 mg daily to Rosuvastatin 20 mg daily by Dr Donato Schultz. She is managing well.   Neuropathy Patient still has been experiencing back pain. She is currently taking Gabapentin 300 mg at nighttime with no complication. She is tolerating her medication well. Denies any adverse side effects. Does not thinks Gabapentin has been helping.   Past Medical History:  Diagnosis Date   Anemia    C. difficile diarrhea    2015 -- caught from mother   Carpal tunnel syndrome    Chronic migraine    Congenital cataract    left eye, blind in left eye   Connective tissue disease (HCC)    Diverticulitis    distal colon -- confrimed  on CT on 01/16/16 and colonoscopy on 08/29/15   H/O hiatal hernia    no meds - no problems   Headache(784.0)    cannot tolerate many NSAIDs, would do injection of depomedrol in office   HLA B27 (HLA B27 positive)    Hyperlipidemia    Hypertensive retinopathy    Hypothyroidism    well controlled   Kidney stones    Memory loss    associated with peudotumor cerebri   Obesity, morbid (HCC)    Osteoarthritis    Polyarthritis, 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HCC)    Prediabetes    Pseudotumor cerebri syndrome    on lasix   Radiculopathy    Seasonal allergies    Seizures (HCC)     on meds - last one 10/18/11- meds increased -- thinks of soma   Sinusitis    Sleep apnea    Does use CPAP   Spondylarthritis    Transient alteration of awareness    Uterine fibroid    Vitamin D deficiency      Social History   Tobacco Use   Smoking status: Never   Smokeless tobacco: Never  Substance Use Topics   Alcohol use: Yes    Alcohol/week: 1.0 standard drink of alcohol    Types: 1 Glasses of wine per week   Drug use: No    Past Surgical History:  Procedure Laterality Date   BACK SURGERY  09/28/2007   Degenerative Disc Disease - surgery on  L3/4, L4/5   EYE SURGERY  1975   left eye cataract surgery   IUD REMOVAL  01/09/2012   Procedure: INTRAUTERINE DEVICE (IUD) REMOVAL;  Surgeon: Marvene Staff, MD;  Location: [facility];  Service: Gynecology;  Laterality: N/A;   OVARIAN CYST REMOVAL  01/09/2012   Procedure: OVARIAN CYSTECTOMY;  Surgeon: Marvene Staff, MD;  Location: [facility];  Service: Gynecology;  Laterality: Right;   right knee arthroscopic  09/24/2006   TONSILLECTOMY     VESICOVAGINAL FISTULA CLOSURE W/ TAH     for fibroids    Family History  Problem Relation Age of Onset   Hypertension Mother    Heart disease Mother    Diabetes Mother    Lupus Mother    Dementia Mother    CAD Father    Heart disease Father    Hypertension Brother    Diabetes Brother    Hypertension Sister     Breast cancer Sister    Heart disease Maternal Grandmother    CVA Maternal Grandmother    Colon cancer Neg Hx     Allergies  Allergen Reactions   Shellfish Allergy Itching   Advil [Ibuprofen]     Rapid heartbeat   Aleve [Naproxen Sodium]     dizziness Nausea -- only causes if this takes regularly   Benadryl [Diphenhydramine] Hives and Other (See Comments)    fatigue   Doxycycline Other (See Comments)    Kidney stones   Lactose Intolerance (Gi) Diarrhea   Limbrel [Flavocoxid]     Diarrhea     Mobic [Meloxicam]     vomiting    Neosporin [Neomycin-Bacitracin Zn-Polymyx]     Causes infection   Soma [Carisoprodol] Other (See Comments)    seizures   Codeine Rash    Patient states that Codeine in liquid form causes her rash reaction.  But other forms of codeine are okay for patient to take.   Latex Rash   Morphine And Related Itching and Rash   Penicillins Rash   Prednisone Palpitations    Weight gain    Current Medications:   Current Outpatient Medications:    acetaminophen (TYLENOL) 500 MG tablet, Take 500 mg by mouth every 6 (six) hours as needed. Patient used this medication for pain., Disp: , Rfl:    amLODipine (NORVASC) 10 MG tablet, Take 1 tablet (10 mg total) by mouth daily., Disp: 90 tablet, Rfl: 3   Biotin 2.5 MG CAPS, Take 1 capsule by mouth daily., Disp: , Rfl:    carvedilol (COREG) 25 MG tablet, Take 1 tablet (25 mg total) by mouth 2 (two) times daily with a meal., Disp: 180 tablet, Rfl: 3   cetirizine (ZYRTEC) 10 MG tablet, Take 1 tablet (10 mg total) by mouth daily., Disp: 30 tablet, Rfl: 11   Cholecalciferol (VITAMIN D-3) 5000 units TABS, Takes one tablet daily., Disp: 30 tablet, Rfl:    Clobetasol & Clobetasol Emul 0.05 & 0.05 % MISC, Apply 1 application topically 2 (two) times daily as needed. , Disp: , Rfl:    CRANBERRY PO, Take 2 capsules by mouth 3 (three) times daily. , Disp: , Rfl:    diclofenac sodium (VOLTAREN) 1 % GEL, Apply 2 g topically 4 (four)  times daily., Disp: , Rfl:    gabapentin (NEURONTIN) 300 MG capsule, TAKE 1 CAPSULE(300 MG) BY MOUTH AT BEDTIME, Disp: 90 capsule, Rfl: 0   hydroxychloroquine (PLAQUENIL) 200 MG tablet, Take 400 mg by mouth daily., Disp: , Rfl:    levothyroxine (SYNTHROID) 75 MCG tablet, TAKE 1 TABLET(75  MCG) BY MOUTH DAILY, Disp: 90 tablet, Rfl: 0   losartan (COZAAR) 100 MG tablet, Take 1 tablet (100 mg total) by mouth daily., Disp: 90 tablet, Rfl: 3   Multiple Vitamin (MULTIVITAMIN) tablet, Take 2 tablets by mouth daily., Disp: , Rfl:    potassium chloride (KLOR-CON) 10 MEQ tablet, Take 1 tablet (10 mEq total) by mouth 2 (two) times daily., Disp: 180 tablet, Rfl: 3   Pyridoxine HCl (VITAMIN B6) 200 MG TABS, Take one tablet daily., Disp: 30 tablet, Rfl:    vitamin C (ASCORBIC ACID) 500 MG tablet, Take 500 mg by mouth daily., Disp: , Rfl:    zonisamide (ZONEGRAN) 100 MG capsule, Take 400 mg by mouth daily. , Disp: , Rfl:    furosemide (LASIX) 40 MG tablet, Take 1.5 tablets (60 mg total) by mouth daily., Disp: 135 tablet, Rfl: 3   rosuvastatin (CRESTOR) 20 MG tablet, Take 1 tablet (20 mg total) by mouth daily., Disp: 90 tablet, Rfl: 3   Review of Systems:   ROS Negative unless otherwise specified per HPI.   Vitals:   Vitals:   05/08/22 1324  BP: 140/80  Pulse: 75  Temp: 97.9 F (36.6 C)  TempSrc: Temporal  SpO2: 98%  Weight: 235 lb 6.1 oz (106.8 kg)  Height: 5' 3" (1.6 m)     Body mass index is 41.7 kg/m.  Physical Exam:   Physical Exam Vitals and nursing note reviewed.  Constitutional:      General: She is not in acute distress.    Appearance: She is well-developed. She is not ill-appearing or toxic-appearing.  Cardiovascular:     Rate and Rhythm: Normal rate and regular rhythm.     Pulses: Normal pulses.     Heart sounds: Normal heart sounds, S1 normal and S2 normal.  Pulmonary:     Effort: Pulmonary effort is normal.     Breath sounds: Normal breath sounds.  Skin:    General: Skin  is warm and dry.  Neurological:     Mental Status: She is alert.     GCS: GCS eye subscore is 4. GCS verbal subscore is 5. GCS motor subscore is 6.  Psychiatric:        Speech: Speech normal.        Behavior: Behavior normal. Behavior is cooperative.     Assessment and Plan:   Essential hypertension Normotensive  Continue Carvedilol 25 mg twice daily, Amlodipine 10 mg, Losartan 100 mg and Lasix 60 mg daily Follow-up in 3-6 months, sooner if concerns  Hypothyroidism, unspecified type Update TSH and adjust levothyroxine 75 mcg accordingly Follow-up based on results  Neuropathy Controlled Continue gabapentin 300 mg nightly (she cannot tolerate higher doses due to sedation)  Obesity, unspecified classification, unspecified obesity type, unspecified whether serious comorbidity present Continue to work on healthy diet and exercise as able   [employer] as a Education administrator for [employer], PA.,have documented all relevant documentation on the behalf of Inda Coke, PA,as directed by  Inda Coke, PA while in the presence of Inda Coke, [place].   I, Inda Coke, [place], have reviewed all documentation for this visit. The documentation on 05/08/22 for the exam, diagnosis, procedures, and orders are all accurate and complete.  Inda Coke, PA-C

## 2022-05-08 NOTE — Patient Instructions (Signed)
It was great to see you!  I will refill all medications today.  Let's follow-up in 3-6 months, sooner if you have concerns.  If a referral was placed today --> you will be contacted for an appointment. Please note that routine referrals can sometimes take up to 3-4 weeks to process. Please call our office if you haven't heard anything after this time frame.  If blood work, urine studies, or any imaging was ordered today -->  we will release your results to you on your MyChart account (if you have chosen to sign up for this) with further instructions. You may see the results before I do, but when I review them I will send you a message with my report or have my staff call you if things need to be discussed. Please reply to my message with any questions.   Take care,  Inda Coke PA-C

## 2022-05-14 ENCOUNTER — Other Ambulatory Visit: Payer: Self-pay | Admitting: *Deleted

## 2022-05-14 MED ORDER — AMLODIPINE BESYLATE 10 MG PO TABS: 10.0000 mg | ORAL_TABLET | Freq: Every day | ORAL | 1 refills | Status: DC

## 2022-05-14 MED ORDER — POTASSIUM CHLORIDE CRYS ER 10 MEQ PO TBCR: 10.0000 meq | EXTENDED_RELEASE_TABLET | Freq: Two times a day (BID) | ORAL | 1 refills | Status: DC

## 2022-05-14 MED ORDER — FUROSEMIDE 40 MG PO TABS: 60.0000 mg | ORAL_TABLET | Freq: Every day | ORAL | 1 refills | Status: DC

## 2022-05-22 ENCOUNTER — Telehealth: Payer: Self-pay | Admitting: Physician Assistant

## 2022-05-22 MED ORDER — LEVOTHYROXINE SODIUM 75 MCG PO TABS: 75.0000 ug | ORAL_TABLET | Freq: Every day | ORAL | 0 refills | Status: DC

## 2022-05-22 MED ORDER — CARVEDILOL 25 MG PO TABS: 25.0000 mg | ORAL_TABLET | Freq: Two times a day (BID) | ORAL | 1 refills | Status: DC

## 2022-05-22 NOTE — Telephone Encounter (Signed)
Rx's sent to pharmacy as requested. 

## 2022-05-22 NOTE — Telephone Encounter (Signed)
-  Pt requesting 2 prescriptions /// 90 day supply for both.  -Pt requests a call if there are any issues - States she did request refills through the pharmacy - FO Rep couldn't confirm this.    LAST APPOINTMENT DATE:   OV 05/08/22 with PCP   NEXT APPOINTMENT DATE: OV 08/15/22 with PCP   MEDICATION: carvedilol (COREG) 25 MG tablet [[id number]]   levothyroxine (SYNTHROID) 75 MCG tablet [[id number]]    Is the patient out of medication?  Yes.   PHARMACY: [id number] [address]  [place], [place] [address]  Phone:  [phone]  Fax:  [phone]

## 2022-06-19 ENCOUNTER — Ambulatory Visit (HOSPITAL_COMMUNITY)
Admission: RE | Admit: 2022-06-19 | Discharge: 2022-06-19 | Disposition: A | Discharge status: Home / Self Care | Payer: 59 | Source: Ambulatory Visit | Attending: Physician Assistant | Admitting: Physician Assistant

## 2022-06-19 ENCOUNTER — Other Ambulatory Visit: Payer: Self-pay | Admitting: Physician Assistant

## 2022-06-19 ENCOUNTER — Encounter: Payer: Self-pay | Admitting: Physician Assistant

## 2022-06-19 ENCOUNTER — Ambulatory Visit (INDEPENDENT_AMBULATORY_CARE_PROVIDER_SITE_OTHER): Payer: 59 | Admitting: Physician Assistant

## 2022-06-19 VITALS — BP 140/80 | HR 79 | Temp 98.0°F | Ht 63.0 in | Wt 233.0 lb

## 2022-06-19 DIAGNOSIS — R748 Abnormal levels of other serum enzymes: Secondary | ICD-10-CM | POA: Diagnosis not present

## 2022-06-19 DIAGNOSIS — R0609 Other forms of dyspnea: Secondary | ICD-10-CM | POA: Diagnosis not present

## 2022-06-19 LAB — CBC WITH DIFFERENTIAL/PLATELET
Basophils Absolute: 0 10*3/uL (ref 0.0–0.1)
Basophils Relative: 1.1 % (ref 0.0–3.0)
Eosinophils Absolute: 0.2 10*3/uL (ref 0.0–0.7)
Eosinophils Relative: 4.2 % (ref 0.0–5.0)
HCT: 41.5 % (ref 36.0–46.0)
Hemoglobin: 13.6 g/dL (ref 12.0–15.0)
Lymphocytes Relative: 33.9 % (ref 12.0–46.0)
Lymphs Abs: 1.5 10*3/uL (ref 0.7–4.0)
MCHC: 32.8 g/dL (ref 30.0–36.0)
MCV: 88.8 fl (ref 78.0–100.0)
Monocytes Absolute: 0.4 10*3/uL (ref 0.1–1.0)
Monocytes Relative: 9.2 % (ref 3.0–12.0)
Neutro Abs: 2.2 10*3/uL (ref 1.4–7.7)
Neutrophils Relative %: 51.6 % (ref 43.0–77.0)
Platelets: 231 10*3/uL (ref 150.0–400.0)
RBC: 4.67 Mil/uL (ref 3.87–5.11)
RDW: 13.7 % (ref 11.5–15.5)
WBC: 4.3 10*3/uL (ref 4.0–10.5)

## 2022-06-19 LAB — COMPREHENSIVE METABOLIC PANEL
ALT: 25 U/L (ref 0–35)
AST: 26 U/L (ref 0–37)
Albumin: 4.2 g/dL (ref 3.5–5.2)
Alkaline Phosphatase: 86 U/L (ref 39–117)
BUN: 14 mg/dL (ref 6–23)
CO2: 27 mEq/L (ref 19–32)
Calcium: 9.4 mg/dL (ref 8.4–10.5)
Chloride: 105 mEq/L (ref 96–112)
Creatinine, Ser: 0.95 mg/dL (ref 0.40–1.20)
GFR: 65.43 mL/min (ref >60.00)
Glucose, Bld: 92 mg/dL (ref 70–99)
Potassium: 4 mEq/L (ref 3.5–5.1)
Sodium: 139 mEq/L (ref 135–145)
Total Bilirubin: 0.4 mg/dL (ref 0.2–1.2)
Total Protein: 7.1 g/dL (ref 6.0–8.3)

## 2022-06-19 LAB — TSH: TSH: 2.84 u[IU]/mL (ref 0.35–5.50)

## 2022-06-19 LAB — POCT I-STAT CREATININE: Creatinine, Ser: 1 mg/dL (ref 0.44–1.00)

## 2022-06-19 MED ORDER — IOHEXOL 350 MG/ML SOLN
100.0000 mL | Freq: Once | INTRAVENOUS | Status: AC | PRN
  Administered 2022-06-19: 80 mL via INTRAVENOUS

## 2022-06-19 MED ORDER — ALBUTEROL SULFATE HFA 108 (90 BASE) MCG/ACT IN AERS: 2.0000 | INHALATION_SPRAY | Freq: Four times a day (QID) | RESPIRATORY_TRACT | 0 refills | Status: AC | PRN

## 2022-06-19 NOTE — Patient Instructions (Signed)
It was great to see you!  We are going to get blood work and a CT scan  If worsening symptoms -> please go to the ER  We will call you with your CT scan schedule  Take care,  Inda Coke PA-C

## 2022-06-19 NOTE — Progress Notes (Signed)
Tammy Avery is a 61 y.o. female here for a new problem of cough.   History of Present Illness:   Chief Complaint  Patient presents with   Cough    Pt c/o dry cough, nasal congestion , wheezing x 1 week. Denies fever or chills.    HPI  Dyspnea on Exertion Patient complain of shortness of breath with exertion that has been onset for 1 week. Associated with wheezing, nasal congestion and cough.  Patient states she was unable to breathe after walking for few minutes. States she was wheezing and felt like she was going to "pass out" while walking back to her driveway. Symptoms seems to be worsening with walking. Feels short of breath when walking. Has to stop after walking a few steps and take several deep breaths to get control of her breath. States she was given ventolin inhaler by her allergist years ago. She has been using this daily and has worked well for her. No other specific treatment tried. No pain in legs, no hx of blood clot. No leg swelling. No chest pain. She has lasix to use daily and is taking this as prescribed.  Denies fever or chills. No known sick contacts. Denies fevers, itchy or watery red eyes, sinus pain. Denies any other aggravating or relieving factors.    Patient complain of increased fatigue for the past few months. Thinks this is related to her Rosuvastatin 20 mg daily. She was switched from Pravastatin 40 mg daily to Rosuvastatin 20 mg daily by Dr Candee Furbish. She would like to update her blood work today. Denies any other concerns.    Elevated liver enzymes Patient had blood work recently and her liver labs was found to be elevated. She would like to be rechecked this today.    Past Medical History:  Diagnosis Date   Anemia    C. difficile diarrhea    2015 -- caught from mother   Carpal tunnel syndrome    Chronic migraine    Congenital cataract    left eye, blind in left eye   Connective tissue disease ([place])    Diverticulitis    distal colon --  confrimed on CT on 01/16/16 and colonoscopy on 08/29/15   H/O hiatal hernia    no meds - no problems   Headache(784.0)    cannot tolerate many NSAIDs, would do injection of depomedrol in office   HLA B27 (HLA B27 positive)    Hyperlipidemia    Hypertensive retinopathy    Hypothyroidism    well controlled   Kidney stones    Memory loss    associated with peudotumor cerebri   Obesity, morbid (HCC)    Osteoarthritis    Polyarthritis, 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HCC)    Prediabetes    Pseudotumor cerebri syndrome    on lasix   Radiculopathy    Seasonal allergies    Seizures ([place])     on meds - last one 10/18/11- meds increased -- thinks of soma   Sinusitis    Sleep apnea    Does use CPAP   Spondylarthritis    Transient alteration of awareness    Uterine fibroid    Vitamin D deficiency      Social History   Tobacco Use   Smoking status: Never   Smokeless tobacco: Never  Substance Use Topics   Alcohol use: Yes    Alcohol/week: 1.0 standard drink of alcohol    Types: 1 Glasses of wine per week   Drug use: No  Past Surgical History:  Procedure Laterality Date   BACK SURGERY  09/28/2007   Degenerative Disc Disease - surgery on  L3/4, L4/5   EYE SURGERY  1975   left eye cataract surgery   IUD REMOVAL  01/09/2012   Procedure: INTRAUTERINE DEVICE (IUD) REMOVAL;  Surgeon: Marvene Staff, MD;  Location: [facility];  Service: Gynecology;  Laterality: N/A;   OVARIAN CYST REMOVAL  01/09/2012   Procedure: OVARIAN CYSTECTOMY;  Surgeon: Marvene Staff, MD;  Location: [facility];  Service: Gynecology;  Laterality: Right;   right knee arthroscopic  09/24/2006   TONSILLECTOMY     VESICOVAGINAL FISTULA CLOSURE W/ TAH     for fibroids    Family History  Problem Relation Age of Onset   Hypertension Mother    Heart disease Mother    Diabetes Mother    Lupus Mother    Dementia Mother    CAD Father    Heart disease Father    Hypertension Brother    Diabetes Brother    Hypertension  Sister    Breast cancer Sister    Heart disease Maternal Grandmother    CVA Maternal Grandmother    Colon cancer Neg Hx     Allergies  Allergen Reactions   Shellfish Allergy Itching   Advil [Ibuprofen]     Rapid heartbeat   Aleve [Naproxen Sodium]     dizziness Nausea -- only causes if this takes regularly   Benadryl [Diphenhydramine] Hives and Other (See Comments)    fatigue   Doxycycline Other (See Comments)    Kidney stones   Lactose Intolerance (Gi) Diarrhea   Limbrel [Flavocoxid]     Diarrhea     Mobic [Meloxicam]     vomiting    Neosporin [Neomycin-Bacitracin Zn-Polymyx]     Causes infection   Soma [Carisoprodol] Other (See Comments)    seizures   Codeine Rash    Patient states that Codeine in liquid form causes her rash reaction.  But other forms of codeine are okay for patient to take.   Latex Rash   Morphine And Related Itching and Rash   Penicillins Rash   Prednisone Palpitations    Weight gain    Current Medications:   Current Outpatient Medications:    acetaminophen (TYLENOL) 500 MG tablet, Take 500 mg by mouth every 6 (six) hours as needed. Patient used this medication for pain., Disp: , Rfl:    amLODipine (NORVASC) 10 MG tablet, Take 1 tablet (10 mg total) by mouth daily., Disp: 90 tablet, Rfl: 1   Biotin 2.5 MG CAPS, Take 1 capsule by mouth daily., Disp: , Rfl:    carvedilol (COREG) 25 MG tablet, Take 1 tablet (25 mg total) by mouth 2 (two) times daily with a meal., Disp: 180 tablet, Rfl: 1   cetirizine (ZYRTEC) 10 MG tablet, Take 1 tablet (10 mg total) by mouth daily., Disp: 30 tablet, Rfl: 11   Cholecalciferol (VITAMIN D-3) 5000 units TABS, Takes one tablet daily., Disp: 30 tablet, Rfl:    Clobetasol & Clobetasol Emul 0.05 & 0.05 % MISC, Apply 1 application topically 2 (two) times daily as needed. , Disp: , Rfl:    CRANBERRY PO, Take 2 capsules by mouth daily in the afternoon. 4200 mg, Disp: , Rfl:    diclofenac sodium (VOLTAREN) 1 % GEL, Apply 2 g  topically 4 (four) times daily., Disp: , Rfl:    furosemide (LASIX) 40 MG tablet, Take 1.5 tablets (60 mg total) by mouth daily., Disp:  90 tablet, Rfl: 1   gabapentin (NEURONTIN) 300 MG capsule, TAKE 1 CAPSULE(300 MG) BY MOUTH AT BEDTIME, Disp: 90 capsule, Rfl: 0   hydroxychloroquine (PLAQUENIL) 200 MG tablet, Take 400 mg by mouth daily., Disp: , Rfl:    levothyroxine (SYNTHROID) 75 MCG tablet, Take 1 tablet (75 mcg total) by mouth daily before breakfast., Disp: 90 tablet, Rfl: 0   losartan (COZAAR) 100 MG tablet, Take 1 tablet (100 mg total) by mouth daily., Disp: 90 tablet, Rfl: 3   Multiple Vitamin (MULTIVITAMIN) tablet, Take 2 tablets by mouth daily., Disp: , Rfl:    naproxen sodium (ALEVE) 220 MG tablet, Take 1-2 tablets by mouth as needed., Disp: , Rfl:    Oxymetazoline HCl (AFRIN 12 HOUR NA), Place 2 sprays into the nose as needed., Disp: , Rfl:    potassium chloride (KLOR-CON M) 10 MEQ tablet, Take 1 tablet (10 mEq total) by mouth 2 (two) times daily., Disp: 180 tablet, Rfl: 1   Pyridoxine HCl (VITAMIN B6) 200 MG TABS, Take one tablet daily., Disp: 30 tablet, Rfl:    zonisamide (ZONEGRAN) 100 MG capsule, Take 400 mg by mouth daily. , Disp: , Rfl:    rosuvastatin (CRESTOR) 20 MG tablet, Take 1 tablet (20 mg total) by mouth daily., Disp: 90 tablet, Rfl: 3   Review of Systems:   ROS Negative unless otherwise specified per HPI.   Vitals:   Vitals:   06/19/22 0953  BP: (!) 140/80  Pulse: 79  Temp: 98 F (36.7 C)  TempSrc: Temporal  SpO2: 98%  Weight: 233 lb (105.7 kg)  Height: 5' 3"  (1.6 m)     Body mass index is 41.27 kg/m.  O2 when walking is 98% Pulse when walking remained in 70's  Physical Exam:   Physical Exam Vitals and nursing note reviewed.  Constitutional:      General: She is not in acute distress.    Appearance: She is well-developed. She is not ill-appearing or toxic-appearing.  Cardiovascular:     Rate and Rhythm: Normal rate and regular rhythm.      Pulses: Normal pulses.     Heart sounds: Normal heart sounds, S1 normal and S2 normal.     Comments: No obvious swelling in LE Pulmonary:     Effort: Pulmonary effort is normal.     Breath sounds: Normal breath sounds.  Musculoskeletal:     Right lower leg: No edema.     Left lower leg: No edema.  Skin:    General: Skin is warm and dry.  Neurological:     Mental Status: She is alert.     GCS: GCS eye subscore is 4. GCS verbal subscore is 5. GCS motor subscore is 6.  Psychiatric:        Speech: Speech normal.        Behavior: Behavior normal. Behavior is cooperative.     Assessment and Plan:   DOE (dyspnea on exertion) Concern for possible PE Will obtain blood work and CTA to r/o blood clot, pulmonary edema, among other causes of DOE Follow-up based on results  Elevated liver enzymes Recheck CMP today for further evaluation; management based on results  I,Savera Zaman,acting as a scribe for [employer], PA.,have documented all relevant documentation on the behalf of Inda Coke, PA,as directed by  Inda Coke, PA while in the presence of Inda Coke, [place].   I, Inda Coke, [place], have reviewed all documentation for this visit. The documentation on 06/19/22 for the exam, diagnosis, procedures,  and orders are all accurate and complete.   Inda Coke, PA-C

## 2022-06-21 ENCOUNTER — Encounter: Payer: Self-pay | Admitting: Physician Assistant

## 2022-06-23 NOTE — Progress Notes (Signed)
Cardiology Office Note:    Date:  06/24/2022   ID:  [name], DOB [birth year], MRN [id number]  PCP:  Inda Coke, [place] Providers Cardiologist:  Candee Furbish, MD Cardiology APP:  Sharmon Revere     Referring MD: Inda Coke, [place]   CC: Dyspnea on exertion  History of Present Illness:    Tammy Avery is a 62 y.o. female with a hx of the following:  HTN Left anterior fascicular block OSA Hypothyroidism HLD Morbid obesity Prediabetes Gallstones Atypical chest pain Coronary calcification by calcium score Aortic atherosclerosis Seizures Family hx of early CAD Left adrenal mass   She is following cardiology services because she has a family history of early coronary artery disease.  She had a nuclear medicine stress test performed in 2016 that was normal, LVEF 55-65%. She was last seen by Dr. Marlou Porch on November 23, 2021.  Blood pressure was well controlled.  She was doing overall very well from a cardiac perspective, previously had some shortness of breath when walking uphill otherwise no significant chest pain. Dr. Marlou Porch ordered calcium score 12/2021 which showed coronary calcium score of 183, and aortic atherosclerosis noted, no acute extracardiac findings noted on the CT chest. Statin titrated.  She follows [facility] neurology for history of seizures.  Works at the [employer].  Today she presents for her follow-up appointment. She states she had an episode about 2 weeks ago [place] she got very short of breath with exertion and was wheezing. She was having dry cough and nasal congestion at the time. It was a very hot day and she was wearing a mask outdoors. She said she had to immediately go to her car and calm herself down and get her [facility] running, and started to feel better.  However, she has then noticed about 2 days later that she was getting more short of breath with exertion and was wheezing.  Said she would walk  around the house or go to get her mail at her mailbox and will get very short of breath with exertion and wheezing which was new for her.  Says she went to her primary care doctor who prescribed albuterol inhaler and says her symptoms have improved and resolved. CTA showed no acute PE, interstitial edema, pulmonary edema or pleural effusion. EKG had shown NSR with known LAFB but no acute STT changes.  I asked her about any diagnosis of asthma in the past, and she denied any official diagnosis of asthma.  Denies any chest pain, palpitations, syncope, presyncope, or feeling like she might pass out, swelling, acute weight changes, orthopnea, PND, bleeding, or claudication.  States she is not compliant with using CPAP machine as it makes loud noises and air seems to be leaking out of the hoses.  She is compliant with taking her medications.  She does not know why her blood pressure is elevated on exam today and shows me her blood pressures at home that are very well controlled.  She is aware of her adrenal mass that was recently seen on CT exam and is following urology regularly.  Denies any other questions or concerns today.  Past Medical History:  Diagnosis Date   Anemia    C. difficile diarrhea    2015 -- caught from mother   Carpal tunnel syndrome    Chronic migraine    Congenital cataract    left eye, blind in left eye  Connective tissue disease ([place])    Diverticulitis    distal colon -- confrimed on CT on 01/16/16 and colonoscopy on 08/29/15   H/O hiatal hernia    no meds - no problems   Headache(784.0)    cannot tolerate many NSAIDs, would do injection of depomedrol in office   HLA B27 (HLA B27 positive)    Hyperlipidemia    Hypertensive retinopathy    Hypothyroidism    well controlled   Kidney stones    Memory loss    associated with peudotumor cerebri   Obesity, morbid (HCC)    Osteoarthritis    Polyarthritis, 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HCC)    Prediabetes    Pseudotumor cerebri syndrome    on  lasix   Radiculopathy    Seasonal allergies    Seizures ([place])     on meds - last one 10/18/11- meds increased -- thinks of soma   Sinusitis    Sleep apnea    Does use CPAP   Spondylarthritis    Transient alteration of awareness    Uterine fibroid    Vitamin D deficiency     Past Surgical History:  Procedure Laterality Date   BACK SURGERY  09/28/2007   Degenerative Disc Disease - surgery on  L3/4, L4/5   EYE SURGERY  1975   left eye cataract surgery   IUD REMOVAL  01/09/2012   Procedure: INTRAUTERINE DEVICE (IUD) REMOVAL;  Surgeon: Marvene Staff, MD;  Location: [facility];  Service: Gynecology;  Laterality: N/A;   OVARIAN CYST REMOVAL  01/09/2012   Procedure: OVARIAN CYSTECTOMY;  Surgeon: Marvene Staff, MD;  Location: [facility];  Service: Gynecology;  Laterality: Right;   right knee arthroscopic  09/24/2006   TONSILLECTOMY     VESICOVAGINAL FISTULA CLOSURE W/ TAH     for fibroids    Current Medications: Current Meds  Medication Sig   acetaminophen (TYLENOL) 500 MG tablet Take 500 mg by mouth every 6 (six) hours as needed. Patient used this medication for pain.   albuterol (VENTOLIN HFA) 108 (90 Base) MCG/ACT inhaler Inhale 2 puffs into the lungs every 6 (six) hours as needed for wheezing or shortness of breath.   amLODipine (NORVASC) 10 MG tablet Take 1 tablet (10 mg total) by mouth daily.   Biotin 2.5 MG CAPS Take 1 capsule by mouth daily.   carvedilol (COREG) 25 MG tablet Take 1 tablet (25 mg total) by mouth 2 (two) times daily with a meal.   cetirizine (ZYRTEC) 10 MG tablet Take 1 tablet (10 mg total) by mouth daily.   Cholecalciferol (VITAMIN D-3) 5000 units TABS Takes one tablet daily.   Clobetasol & Clobetasol Emul 0.05 & 0.05 % MISC Apply 1 application topically 2 (two) times daily as needed.    CRANBERRY PO Take 2 capsules by mouth daily in the afternoon. 4200 mg   diclofenac sodium (VOLTAREN) 1 % GEL Apply 2 g topically 4 (four) times daily.   furosemide (LASIX)  40 MG tablet Take 1.5 tablets (60 mg total) by mouth daily.   gabapentin (NEURONTIN) 300 MG capsule TAKE 1 CAPSULE(300 MG) BY MOUTH AT BEDTIME   hydroxychloroquine (PLAQUENIL) 200 MG tablet Take 400 mg by mouth daily.   levothyroxine (SYNTHROID) 75 MCG tablet Take 1 tablet (75 mcg total) by mouth daily before breakfast.   losartan (COZAAR) 100 MG tablet Take 1 tablet (100 mg total) by mouth daily.   Multiple Vitamin (MULTIVITAMIN) tablet Take 2 tablets by mouth daily.   naproxen sodium (ALEVE)  220 MG tablet Take 1-2 tablets by mouth as needed.   Oxymetazoline HCl (AFRIN 12 HOUR NA) Place 2 sprays into the nose as needed.   potassium chloride (KLOR-CON M) 10 MEQ tablet Take 1 tablet (10 mEq total) by mouth 2 (two) times daily.   Pyridoxine HCl (VITAMIN B6) 200 MG TABS Take one tablet daily.   zonisamide (ZONEGRAN) 100 MG capsule Take 400 mg by mouth daily.      Allergies:   Shellfish allergy, Advil [ibuprofen], Aleve [naproxen sodium], Aspirin, Benadryl [diphenhydramine], Doxycycline, Lactose intolerance (gi), Limbrel [flavocoxid], Mobic [meloxicam], Neosporin [neomycin-bacitracin zn-polymyx], Soma [carisoprodol], Codeine, Latex, Morphine and related, Penicillins, and Prednisone   Social History   Socioeconomic History   Marital status: Single    Spouse name: Not on file   Number of children: Not on file   Years of education: Not on file   Highest education level: Not on file  Occupational History   Not on file  Tobacco Use   Smoking status: Never   Smokeless tobacco: Never  Substance and Sexual Activity   Alcohol use: Yes    Alcohol/week: 1.0 standard drink of alcohol    Types: 1 Glasses of wine per week   Drug use: No   Sexual activity: Never    Birth control/protection: None  Other Topics Concern   Not on file  Social History Narrative   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[place]   Single   No children   Fun: fantasy football ([employer], Doctor, general practice, Engineer, manufacturing systems)   Social Determinants of Adult nurse Strain: Not on file  Food Insecurity: Not on file  Transportation Needs: Not on file  Physical Activity: Not on file  Stress: Not on file  Social Connections: Not on file     Family History: The patient's family history includes Breast cancer in her sister; CAD in her father; CVA in her maternal grandmother; Dementia in her mother; Diabetes in her brother and mother; Heart disease in her father, maternal grandmother, and mother; Hypertension in her brother, mother, and sister; Lupus in her mother. There is no history of Colon cancer.  ROS:   Review of Systems  Constitutional:  Negative for chills, diaphoresis, fever, malaise/fatigue and weight loss.  HENT: Negative.    Eyes: Negative.   Respiratory:  Positive for shortness of breath and wheezing. Negative for cough, hemoptysis and sputum production.        Recent SHOB with exertion and wheezing - see HPI.   Cardiovascular: Negative.   Gastrointestinal: Negative.   Genitourinary: Negative.   Musculoskeletal: Negative.   Skin:  Negative for itching and rash.  Neurological: Negative.   Endo/Heme/Allergies: Negative.   Psychiatric/Behavioral: Negative.      Please see the history of present illness.    All other systems reviewed and are negative.  EKGs/Labs/Other Studies Reviewed:    The following studies were reviewed today:   EKG:  EKG is not ordered today.    CT chest on June 19, 2022: 1. No evidence of pulmonary embolism or other acute intrathoracic process. 2. Cholelithiasis. 3. A 2.6 cm x 1.6 cm x 3.2 cm low-attenuation left adrenal mass is noted. Hounsfield units) are noted. This mass is indeterminate, but statistically likely represents an adrenal adenoma. Recommend adrenal washout CT or chemical shift MRI. JACR 2017 Aug; 14(8):1038-44, JCAT 2016 Mar-Apr; 40(2):194-200, Urol J 2006 Spring; 3(2):71-4.  Coronary calcium score 12/20/2021: Left Main: 0 Left anterior descending artery: 172 Left  circumflex artery: 7.43 Right coronary artery: 3.87 Total:  183 Percentile: 95 Pericardium: Normal. Ascending aorta: Normal caliber; aortic atherosclerosis. No acute extracardiac findings.  Nuclear medicine stress test on August 17, 2015: Left ventricular ejection fraction is normal (55-65%). Nuclear stress EF: 56%. There is no ST segment deviation noted during stress. The study is normal. This is a low risk study. No evidence of ischemia or scar.   Recent Labs: 06/19/2022: ALT 25; BUN 14; Creatinine, Ser 1.00; Hemoglobin 13.6; Platelets 231.0; Potassium 4.0; Sodium 139; TSH 2.84  Recent Lipid Panel    Component Value Date/Time   CHOL 161 02/25/2022 1015   TRIG 49 02/25/2022 1015   HDL 91 02/25/2022 1015   CHOLHDL 2.1 08/28/2020 0940   VLDL 16.0 04/26/2019 0836   LDLCALC 59 02/25/2022 1015   LDLCALC 84 08/28/2020 0940   The 10-year ASCVD risk score (Arnett DK, et al., 2019) is: 7.3%   Values used to calculate the score:     Age: 6 years     Sex: Female     Is Non-Hispanic African American: Yes     Diabetic: No     Tobacco smoker: No     Systolic Blood Pressure: 751 mmHg     Is BP treated: Yes     HDL Cholesterol: 91 mg/dL     Total Cholesterol: 161 mg/dL   Physical Exam:    VS:  BP (!) 160/90   Pulse 78   Ht 5' 3.5" (1.613 m)   Wt 210 lb (95.3 kg)   LMP 12/13/2011   SpO2 97%   BMI 36.62 kg/m     Wt Readings from Last 3 Encounters:  06/24/22 210 lb (95.3 kg)  06/19/22 233 lb (105.7 kg)  05/08/22 235 lb 6.1 oz (106.8 kg)     GEN: Obese, 61 y.o. African American female in no acute distress HEENT: Normal NECK: No JVD; No carotid bruits CARDIAC: RRR, no murmurs, rubs, gallops; 2+ peripheral pulses throughout, strong and equal bilaterally RESPIRATORY:  Clear to auscultation without rales, wheezing or rhonchi  ABDOMEN: Soft, non-tender, distended, bowel sounds X 4 MUSCULOSKELETAL:  Generalized, nonpitting edema; No deformity  SKIN: Warm and  dry NEUROLOGIC:  Alert and oriented x 3 PSYCHIATRIC:  Normal affect   ASSESSMENT:    1. Hypertension, unspecified type   2. Dyspnea on exertion   3. Coronary artery disease of native artery of native heart with stable angina pectoris ([place])   4. Hyperlipidemia, unspecified hyperlipidemia type   5. OSA (obstructive sleep apnea)   6. Morbid obesity ([place])    PLAN:    In order of problems listed above:  Dyspnea exertion-acute, improved Recent episode of what sounds like acute asthma exacerbation [place]. Accompanied by other URI sx at the time.  New prescription albuterol inhaler has completely relieved this.  We will go ahead and obtain a 2D complete echocardiogram to get a baseline for her overall heart function and valvular function. She denied any other associated symptoms with this. CTA did not show any evidence of PE or fluid. I encouraged her to reach out to her primary care doctor and get more work-up done to evaluate for a clinical diagnosis of asthma, including PFTs and spirometry.  Told her to seek ED care and to give our office a call for any worsening symptoms. Consider formal coronary CTA for any recurrent symptoms.  Hypertension - chronic, stable Initial blood pressure reading on exam was 160/90 and repeat blood pressure was 132/78.  Blood pressure is overall well controlled at home  and she verbalized that she does not want to go up on her blood pressure medication today.  Does admit to history of whitecoat hypertension.  Discussed to monitor BP at home at least 2 hours after medications and sitting for 5-10 minutes.  Gave her a blood pressure log and told her to do this daily and to let us know if she starts to develop systolic blood pressure readings of 140 or greater.  If this occurs, we may consider adding spironolactone or hydralazine.  Continue current antihypertensive regimen. If wheezing recurs, could consider changing carvedilol to more selective BB.  CAD,  HLD with LDL goal < 70 - chronic, stable Stable with no anginal symptoms. No indication for ischemic evaluation.  Most recent LDL in April 2023 was at goal at 14.  Continue Coreg and Crestor. I considered starting her on aspirin 81 mg daily however she states she has had a previous allergic reaction to aspirin in the past, so I will add this to her allergy list and we will hold off on this for now.   OSA - chronic, not progressing Encouraged CPAP compliance and risk of untreated sleep apnea to her health.  I told her we can refer her to see we get her CPAP fixed as this sounds like a mechanical issue. See phone note. She verbalizes understanding.   Morbid obesity - chronic, stable She is working out on her bicycle at home. Weight loss via diet and exercise encouraged. Discussed the impact being overweight would have on cardiovascular risk.  6.  Left anterior fascicular block - chronic, stable Asymptomatic. No symptoms of bradycardia.  7. Disposition: F/U in 3 months or sooner if anything changes. If SOB stable, can go back to yearly.       Medication Adjustments/Labs and Tests Ordered: Current medicines are reviewed at length with the patient today.  Concerns regarding medicines are outlined above.  Orders Placed This Encounter  Procedures   ECHOCARDIOGRAM COMPLETE   No orders of the defined types were placed in this encounter.   Patient Instructions  Medication Instructions:  Your physician recommends that you continue on your current medications as directed. Please refer to the Current Medication list given to you today. *If you need a refill on your cardiac medications before your next appointment, please call your pharmacy*   Lab Work: None Ordered   Testing/Procedures: Your physician has requested that you have an echocardiogram. Echocardiography is a painless test that uses sound waves to create images of your heart. It provides your doctor with information about the size  and shape of your heart and how well your heart's chambers and valves are working. This procedure takes approximately one hour. There are no restrictions for this procedure.   Follow-Up: At [facility], you and your health needs are our priority.  As part of our continuing mission to provide you with exceptional heart care, we have created designated Provider Care Teams.  These Care Teams include your primary Cardiologist (physician) and Advanced Practice Providers (APPs -  Physician Assistants and Nurse Practitioners) who all work together to provide you with the care you need, when you need it.  We recommend signing up for the patient portal called "MyChart".  Sign up information is provided on this After Visit Summary.  MyChart is used to connect with patients for Virtual Visits (Telemedicine).  Patients are able to view lab/test results, encounter notes, upcoming appointments, etc.  Non-urgent messages can be sent to your provider as well.  To learn more about what you can do with MyChart, go to [email or web address].    Your next appointment:   3 month(s)  The format for your next appointment:   In Person  Provider:   Melina Copa, PA-C         Other Instructions Sal[place] 6 handout given  Check your blood pressure once a day and record in your log    Important Information About Sugar         Signed, Finis Bud, NP  06/24/2022 5:32 PM    [place]

## 2022-06-24 ENCOUNTER — Encounter: Payer: Self-pay | Admitting: Physician Assistant

## 2022-06-24 ENCOUNTER — Ambulatory Visit (INDEPENDENT_AMBULATORY_CARE_PROVIDER_SITE_OTHER): Payer: 59 | Admitting: Nurse Practitioner

## 2022-06-24 ENCOUNTER — Telehealth: Payer: Self-pay | Admitting: Nurse Practitioner

## 2022-06-24 VITALS — BP 132/78 | HR 78 | Ht 63.5 in | Wt 210.0 lb

## 2022-06-24 DIAGNOSIS — G4733 Obstructive sleep apnea (adult) (pediatric): Secondary | ICD-10-CM

## 2022-06-24 DIAGNOSIS — E039 Hypothyroidism, unspecified: Secondary | ICD-10-CM

## 2022-06-24 DIAGNOSIS — R7303 Prediabetes: Secondary | ICD-10-CM

## 2022-06-24 DIAGNOSIS — I444 Left anterior fascicular block: Secondary | ICD-10-CM

## 2022-06-24 DIAGNOSIS — E785 Hyperlipidemia, unspecified: Secondary | ICD-10-CM

## 2022-06-24 DIAGNOSIS — R0609 Other forms of dyspnea: Secondary | ICD-10-CM

## 2022-06-24 DIAGNOSIS — I25118 Atherosclerotic heart disease of native coronary artery with other forms of angina pectoris: Secondary | ICD-10-CM | POA: Diagnosis not present

## 2022-06-24 DIAGNOSIS — I10 Essential (primary) hypertension: Secondary | ICD-10-CM

## 2022-06-24 NOTE — Telephone Encounter (Signed)
Follow-up from today's visit: Patient inquired about having f/u for sleep apnea. Please confirm with patient when her last sleep study was. If out of date, recommend repeat sleep study (diagnosis OSA), then follow up with Dr. Radford Pax to establish care to follow.

## 2022-06-24 NOTE — Patient Instructions (Addendum)
Medication Instructions:  Your physician recommends that you continue on your current medications as directed. Please refer to the Current Medication list given to you today. *If you need a refill on your cardiac medications before your next appointment, please call your pharmacy*   Lab Work: None Ordered   Testing/Procedures: Your physician has requested that you have an echocardiogram. Echocardiography is a painless test that uses sound waves to create images of your heart. It provides your doctor with information about the size and shape of your heart and how well your heart's chambers and valves are working. This procedure takes approximately one hour. There are no restrictions for this procedure.   Follow-Up: At [facility], you and your health needs are our priority.  As part of our continuing mission to provide you with exceptional heart care, we have created designated Provider Care Teams.  These Care Teams include your primary Cardiologist (physician) and Advanced Practice Providers (APPs -  Physician Assistants and Nurse Practitioners) who all work together to provide you with the care you need, when you need it.  We recommend signing up for the patient portal called "MyChart".  Sign up information is provided on this After Visit Summary.  MyChart is used to connect with patients for Virtual Visits (Telemedicine).  Patients are able to view lab/test results, encounter notes, upcoming appointments, etc.  Non-urgent messages can be sent to your provider as well.   To learn more about what you can do with MyChart, go to [email or web address].    Your next appointment:   3 month(s)  The format for your next appointment:   In Person  Provider:   Melina Copa, PA-C         Other Instructions Sal[place] 6 handout given  Check your blood pressure once a day and record in your log    Important Information About Sugar

## 2022-06-25 ENCOUNTER — Other Ambulatory Visit: Payer: Self-pay | Admitting: Physician Assistant

## 2022-06-25 ENCOUNTER — Encounter: Payer: Self-pay | Admitting: Physician Assistant

## 2022-06-25 DIAGNOSIS — R0609 Other forms of dyspnea: Secondary | ICD-10-CM

## 2022-06-25 NOTE — Telephone Encounter (Signed)
Spoke to pt told her letter for RTW is ready and also FMLA paper work is ready there is no fax number it says to return to patient, so I will put it with the letter. Pt verbalized understanding and said she will be by to pickup.

## 2022-06-25 NOTE — Telephone Encounter (Signed)
Patient states she is not sure when she last had sleep study performed. Patient states she will look at her records at home when she gets a chance and will send [place] information about last sleep study via MyChart message.  Patient states she believes she had home sleep study in 2021, but will verify when she gets home and has access to her records.

## 2022-06-28 DIAGNOSIS — G4733 Obstructive sleep apnea (adult) (pediatric): Secondary | ICD-10-CM

## 2022-07-04 ENCOUNTER — Ambulatory Visit (HOSPITAL_COMMUNITY): Payer: 59 | Attending: Cardiology

## 2022-07-04 DIAGNOSIS — E785 Hyperlipidemia, unspecified: Secondary | ICD-10-CM | POA: Diagnosis present

## 2022-07-04 DIAGNOSIS — I10 Essential (primary) hypertension: Secondary | ICD-10-CM | POA: Diagnosis not present

## 2022-07-04 DIAGNOSIS — R0609 Other forms of dyspnea: Secondary | ICD-10-CM

## 2022-07-04 DIAGNOSIS — G4733 Obstructive sleep apnea (adult) (pediatric): Secondary | ICD-10-CM | POA: Diagnosis present

## 2022-07-04 DIAGNOSIS — I25118 Atherosclerotic heart disease of native coronary artery with other forms of angina pectoris: Secondary | ICD-10-CM | POA: Diagnosis present

## 2022-07-04 LAB — ECHOCARDIOGRAM COMPLETE
Area-P 1/2: 3.83 cm2
S' Lateral: 2.8 cm

## 2022-07-08 ENCOUNTER — Other Ambulatory Visit: Payer: Self-pay | Admitting: Urology

## 2022-07-08 DIAGNOSIS — N281 Cyst of kidney, acquired: Secondary | ICD-10-CM

## 2022-07-09 ENCOUNTER — Telehealth: Payer: Self-pay | Admitting: Cardiology

## 2022-07-09 NOTE — Telephone Encounter (Signed)
Left message for patient with results. Advised to call back with any questions.  

## 2022-07-09 NOTE — Telephone Encounter (Signed)
-----   Message from Finis Bud, NP sent at 07/08/2022 12:43 PM EDT ----- Normal EF. Mild LVH and grade 1 diastolic dysfunction, heart is mildly stiff. Left atria is severely dilated. Trivial pulmonic valve regurg, rest of valvular function is normal. Borderline dilatation of ascending aorta, recommend repeating Echo in 1 year to monitor this. We will continue to monitor for now.   Thanks!  Finis Bud, AGNP-C

## 2022-07-09 NOTE — Telephone Encounter (Signed)
Pt calling back for echo results  

## 2022-07-11 ENCOUNTER — Institutional Professional Consult (permissible substitution): Payer: 59 | Admitting: Internal Medicine

## 2022-07-25 ENCOUNTER — Telehealth: Payer: Self-pay | Admitting: *Deleted

## 2022-07-25 DIAGNOSIS — I25118 Atherosclerotic heart disease of native coronary artery with other forms of angina pectoris: Secondary | ICD-10-CM

## 2022-07-25 DIAGNOSIS — G4733 Obstructive sleep apnea (adult) (pediatric): Secondary | ICD-10-CM

## 2022-07-25 NOTE — Telephone Encounter (Signed)
Prior Authorization for Split night sent to [facility] via Phone. Reference # [id number].  Sleep study denied

## 2022-07-31 ENCOUNTER — Ambulatory Visit (INDEPENDENT_AMBULATORY_CARE_PROVIDER_SITE_OTHER): Payer: 59 | Admitting: Internal Medicine

## 2022-07-31 ENCOUNTER — Encounter: Payer: Self-pay | Admitting: Internal Medicine

## 2022-07-31 VITALS — BP 136/78 | HR 72 | Temp 98.3°F | Ht 63.52 in | Wt 234.2 lb

## 2022-07-31 DIAGNOSIS — J4540 Moderate persistent asthma, uncomplicated: Secondary | ICD-10-CM

## 2022-07-31 LAB — NITRIC OXIDE: Nitric Oxide: 15

## 2022-07-31 MED ORDER — FLUTICASONE FUROATE-VILANTEROL 200-25 MCG/ACT IN AEPB: 1.0000 | INHALATION_SPRAY | Freq: Every day | RESPIRATORY_TRACT | 5 refills | Status: DC

## 2022-07-31 NOTE — Progress Notes (Signed)
The patient has been prescribed the inhaler Breo. Inhaler technique was demonstrated to patient. The patient subsequently demonstrated correct technique.

## 2022-07-31 NOTE — Patient Instructions (Addendum)
Please schedule follow up scheduled with myself in 2 months.  If my schedule is not open yet, we will contact you with a reminder closer to that time. Please call [phone] if you haven't heard from [place] a month before.   Start Breo inhaler 1 puff once a day. Gargle after use.  Take the albuterol rescue inhaler every 4 to 6 hours as needed for wheezing or shortness of breath. You can also take it 15 minutes before exercise or exertional activity. Side effects include heart racing or pounding, jitters or anxiety. If you have a history of an irregular heart rhythm, it can make this worse. Can also give some patients a hard time sleeping.  By learning about asthma and how it can be controlled, you take an important step toward managing this disease. Work closely with your asthma care team to learn all you can about your asthma, how to avoid triggers, what your medications do, and how to take them correctly. With proper care, you can live free of asthma symptoms and maintain a normal, healthy lifestyle.   What is asthma? Asthma is a chronic disease that affects the airways of the lungs. During normal breathing, the bands of muscle that surround the airways are relaxed and air moves freely. During an asthma episode or "attack," there are three main changes that stop air from moving easily through the airways: The bands of muscle that surround the airways tighten and make the airways narrow. This tightening is called bronchospasm.  The lining of the airways becomes swollen or inflamed.  The cells that line the airways produce more mucus, which is thicker than normal and clogs the airways.  These three factors - bronchospasm, inflammation, and mucus production - cause symptoms such as difficulty breathing, wheezing, and coughing.  What are the most common symptoms of asthma? Asthma symptoms are not the same for everyone. They can even change from episode to episode in the same person. Also, you may  have only one symptom of asthma, such as cough, but another person may have all the symptoms of asthma. It is important to know all the symptoms of asthma and to be aware that your asthma can present in any of these ways at any time. The most common symptoms include: Coughing, especially at night  Shortness of breath  Wheezing  Chest tightness, pain, or pressure   Who is affected by asthma? Asthma affects 22 million Americans; about 6 million of these are children under age 87. People who have a family history of asthma have an increased risk of developing the disease. Asthma is also more common in people who have allergies or who are exposed to tobacco smoke. However, anyone can develop asthma at any time. Some people may have asthma all of their lives, while others may develop it as adults.  What causes asthma? The airways in a person with asthma are very sensitive and react to many things, or "triggers." Contact with these triggers causes asthma symptoms. One of the most important parts of asthma control is to identify your triggers and then avoid them when possible. The only trigger you do not want to avoid is exercise. Pre-treatment with medicines before exercise can allow you to stay active yet avoid asthma symptoms. Common asthma triggers include: Infections (colds, viruses, flu, sinus infections)  Exercise  Weather (changes in temperature and/or humidity, cold air)  Tobacco smoke  Allergens (dust mites, pollens, pets, mold spores, cockroaches, and sometimes foods)  Irritants (strong odors  from cleaning products, perfume, wood smoke, air pollution)  Strong emotions such as crying or laughing hard  Some medications   How is asthma diagnosed? To diagnose asthma, your doctor will first review your medical history, family history, and symptoms. Your doctor will want to know any past history of breathing problems you may have had, as well as a family history of asthma, allergies, eczema (a  bumpy, itchy skin rash caused by allergies), or other lung disease. It is important that you describe your symptoms in detail (cough, wheeze, shortness of breath, chest tightness), including when and how often they occur. The doctor will perform a physical examination and listen to your heart and lungs. He or she may also order breathing tests, allergy tests, blood tests, and chest and sinus X-rays. The tests will find out if you do have asthma and if there are any other conditions that are contributing factors.  How is asthma treated? Asthma can be controlled, but not cured. It is not normal to have frequent symptoms, trouble sleeping, or trouble completing tasks. Appropriate asthma care will prevent symptoms and visits to the emergency room and hospital. Asthma medicines are one of the mainstays of asthma treatment. The drugs used to treat asthma are explained below.  Anti-inflammatories: These are the most important drugs for most people with asthma. Anti-inflammatory drugs reduce swelling and mucus production in the airways. As a result, airways are less sensitive and less likely to react to triggers. These medications need to be taken daily and may need to be taken for several weeks before they begin to control asthma. Anti-inflammatory medicines lead to fewer symptoms, better airflow, less sensitive airways, less airway damage, and fewer asthma attacks. If taken every day, they CONTROL or prevent asthma symptoms.   Bronchodilators: These drugs relax the muscle bands that tighten around the airways. This action opens the airways, letting more air in and out of the lungs and improving breathing. Bronchodilators also help clear mucus from the lungs. As the airways open, the mucus moves more freely and can be coughed out more easily. In short-acting forms, bronchodilators RELIEVE or stop asthma symptoms by quickly opening the airways and are very helpful during an asthma episode. In long-acting forms,  bronchodilators provide CONTROL of asthma symptoms and prevent asthma episodes.  Asthma drugs can be taken in a variety of ways. Inhaling the medications by using a metered dose inhaler, dry powder inhaler, or nebulizer is one way of taking asthma medicines. Oral medicines (pills or liquids you swallow) may also be prescribed.  Asthma severity Asthma is classified as either "intermittent" (comes and goes) or "persistent" (lasting). Persistent asthma is further described as being mild, moderate, or severe. The severity of asthma is based on how often you have symptoms both during the day and night, as well as by the results of lung function tests and by how well you can perform activities. The "severity" of asthma refers to how "intense" or "strong" your asthma is.  Asthma control Asthma control is the goal of asthma treatment. Regardless of your asthma severity, it may or may not be controlled. Asthma control means: You are able to do everything you want to do at work and home  You have no (or minimal) asthma symptoms  You do not wake up from your sleep or earlier than usual in the morning due to asthma  You rarely need to use your reliever medicine (inhaler)  Another major part of your treatment is that you are happy  with your asthma care and believe your asthma is controlled.  Monitoring symptoms A key part of treatment is keeping track of how well your lungs are working. Monitoring your symptoms, what they are, how and when they happen, and how severe they are, is an important part of being able to control your asthma.  Sometimes asthma is monitored using a peak flow meter. A peak flow (PF) meter measures how fast the air comes out of your lungs. It can help you know when your asthma is getting worse, sometimes even before you have symptoms. By taking daily peak flow readings, you can learn when to adjust medications to keep asthma under good control. It is also used to create your asthma action  plan (see below). Your doctor can use your peak flow readings to adjust your treatment plan in some cases.  Asthma Action Plan Based on your history and asthma severity, you and your doctor will develop a care plan called an "asthma action plan." The asthma action plan describes when and how to use your medicines, actions to take when asthma worsens, and when to seek emergency care. Make sure you understand this plan. If you do not, ask your asthma care provider any questions you may have. Your asthma action plan is one of the keys to controlling asthma. Keep it readily available to remind you of what you need to do every day to control asthma and what you need to do when symptoms occur.  Goals of asthma therapy These are the goals of asthma treatment: Live an active, normal life  Prevent chronic and troublesome symptoms  Attend work or school every day  Perform daily activities without difficulty  Stop urgent visits to the doctor, emergency department, or hospital  Use and adjust medications to control asthma with few or no side effects

## 2022-07-31 NOTE — Progress Notes (Signed)
Tammy Avery    [id number]    Nov 25, 1960  Primary Care Physician:Worley, [place], [place]  Referring Physician: Inda Coke, [place],  [place] [address] Reason for Consultation: shortness of breath Date of Consultation: 07/31/2022  Chief complaint:   Chief Complaint  Patient presents with   Consult    Sob with exertion x 2 mths., wheezing occass., dry cough     HPI: Tammy Avery is a 61 y.o. woman who presents for new patient evaluation for shortness of breath. Was walking outside in the heat and started having dyspnea, wheezing while walking about two months ago outside. Symptoms have recurred several times since then always with exertion.   She was given albuterol inhaler by PCP. Symptoms relieved by albuterol. Current use is 3 times a week.  She has had bronchitis annually and has been treated with steroids and abx for the last several years.   She has chronic nasal congestion for the last several years and is on medication year round with zyrtec and prn delsym.   No childhood respiratory disease  She says she is on plaquenil for osteoarthritis because she can't tolerate nsaids.   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 Works third shift 7:15pm to 3:45am. Exposures: lives at home with sister. No pets.  Smoking history: never smoker, had passive smoke exposure in childhood from mother.   Social History   Occupational History   Not on file  Tobacco Use   Smoking status: Never   Smokeless tobacco: Never  Substance and Sexual Activity   Alcohol use: Yes    Alcohol/week: 1.0 standard drink of alcohol    Types: 1 Glasses of wine per week   Drug use: No   Sexual activity: Never    Birth control/protection: None    Relevant family history:  Family History  Problem Relation Age of Onset   Hypertension Mother    Heart disease Mother    Diabetes Mother    Lupus Mother    Dementia  Mother    CAD Father    Heart disease Father    Hypertension Sister    Breast cancer Sister    Hypertension Brother    Diabetes Brother    Heart disease Maternal Grandmother    CVA Maternal Grandmother    Colon cancer Neg Hx    Asthma Neg Hx     Past Medical History:  Diagnosis Date   Anemia    C. difficile diarrhea    2015 -- caught from mother   Carpal tunnel syndrome    Chronic migraine    Congenital cataract    left eye, blind in left eye   Connective tissue disease ([place])    Diverticulitis    distal colon -- confrimed on CT on 01/16/16 and colonoscopy on 08/29/15   H/O hiatal hernia    no meds - no problems   Headache(784.0)    cannot tolerate many NSAIDs, would do injection of depomedrol in office   HLA B27 (HLA B27 positive)    Hyperlipidemia    Hypertensive retinopathy    Hypothyroidism    well controlled   Kidney stones    Memory loss    associated with peudotumor cerebri   Obesity, morbid ([place])    Osteoarthritis    Polyarthritis, 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[place])    Prediabetes    Pseudotumor cerebri syndrome    on lasix  Radiculopathy    Seasonal allergies    Seizures ([place])     on meds - last one 10/18/11- meds increased -- thinks of soma   Sinusitis    Sleep apnea    Does use CPAP   Spondylarthritis    Transient alteration of awareness    Uterine fibroid    Vitamin D deficiency     Past Surgical History:  Procedure Laterality Date   BACK SURGERY  09/28/2007   Degenerative Disc Disease - surgery on  L3/4, L4/5   EYE SURGERY  1975   left eye cataract surgery   IUD REMOVAL  01/09/2012   Procedure: INTRAUTERINE DEVICE (IUD) REMOVAL;  Surgeon: Marvene Staff, MD;  Location: [facility];  Service: Gynecology;  Laterality: N/A;   OVARIAN CYST REMOVAL  01/09/2012   Procedure: OVARIAN CYSTECTOMY;  Surgeon: Marvene Staff, MD;  Location: [facility];  Service: Gynecology;  Laterality: Right;   right knee arthroscopic  09/24/2006   TONSILLECTOMY     VESICOVAGINAL  FISTULA CLOSURE W/ TAH     for fibroids     Physical Exam: Blood pressure 136/78, pulse 72, temperature 98.3 F (36.8 C), temperature source Temporal, height 5' 3.52" (1.613 m), weight 234 lb 3.2 oz (106.2 kg), last menstrual period 12/13/2011, SpO2 99 %. Gen:      No acute distress, obese ENT:  mallampati IV, no nasal polyps, mucus membranes moist Lungs:    No increased respiratory effort, symmetric chest wall excursion, clear to auscultation bilaterally, no wheezes or crackles CV:         Regular rate and rhythm; no murmurs, rubs, or gallops.  No pedal edema Abd:      + bowel sounds; soft, non-tender; no distension MSK: no acute synovitis of DIP or PIP joints, no mechanics hands.  Skin:      Warm and dry; no rashes\ Neuro: normal speech, no focal facial asymmetry Psych: alert and oriented x3, normal mood and affect   Data Reviewed/Medical Decision Making:  Independent interpretation of tests: Imaging:  Review of patient's CT PE study Augsut 2023 images revealed no PE, normal lung parenchyma. The patient's images have been independently reviewed by me.    PFTs: I have personally reviewed the patient's PFTs and spiro 07/2022 - no airflow limitation Absolute eosinophil count 298 in 2021, 200 since then  Labs:  Lab Results  Component Value Date   WBC 4.3 06/19/2022   HGB 13.6 06/19/2022   HCT 41.5 06/19/2022   MCV 88.8 06/19/2022   PLT 231.0 06/19/2022   Lab Results  Component Value Date   NA 139 06/19/2022   K 4.0 06/19/2022   CL 105 06/19/2022   CO2 27 06/19/2022     Immunization status:  Immunization History  Administered Date(s) Administered   Influenza,inj,Quad PF,6+ Mos 10/15/2017, 08/21/2019, 08/28/2020   Influenza-Unspecified 08/21/2018   Moderna Sars-Covid-2 Vaccination 01/25/2020, 02/22/2020, 11/06/2020   Tdap 10/15/2017   Zoster, Live 07/23/2016     I reviewed prior external note(s) from PCP  I reviewed the result(s) of the labs and imaging as noted  above.   I have ordered spiro and feno  Assessment:  Asthma, moderate persistent new diagnosis.  Allergic rhinitis GERD, controlled  Plan/Recommendations: Arlyce Harman and feno obtain today - no airflow limitation, feno not elevated at 15 ppb Start Breo inhaler 1 puff once a day. Gargle after use.  Take the albuterol rescue inhaler every 4 to 6 hours as needed for wheezing or shortness of breath. Continue  current allergy meds as tolerated.  Continue PPI   Return to Care: Return in about 2 months (around 09/30/2022).  Lenice Llamas, MD [facility] [place]  CC: Inda Coke, [place]

## 2022-08-05 ENCOUNTER — Other Ambulatory Visit: Payer: 59

## 2022-08-08 NOTE — Telephone Encounter (Signed)
Per Dr Radford Pax, Can you see if they would approve an Itamar home sleep study

## 2022-08-08 NOTE — Telephone Encounter (Signed)
Order for [employer] study entered.

## 2022-08-12 ENCOUNTER — Encounter: Payer: Self-pay | Admitting: *Deleted

## 2022-08-14 NOTE — Telephone Encounter (Signed)
Prior Authorization for ITAMAR sent to [employer] via web portal. Tracking Number  [id number] PA REQ-  

## 2022-08-15 ENCOUNTER — Ambulatory Visit: Payer: 59 | Admitting: Physician Assistant

## 2022-08-19 ENCOUNTER — Other Ambulatory Visit: Payer: Self-pay | Admitting: Physician Assistant

## 2022-08-21 NOTE — Telephone Encounter (Signed)
Left message for the pt to call back and ask to s/w Arbie Cookey with [facility] Sleep studies. Pt needs to be set up with Itamar study. Pt needs STOP BANG to be done. Pt has been approved and will be given PIN# at the time of set up.

## 2022-08-23 ENCOUNTER — Ambulatory Visit
Admission: RE | Admit: 2022-08-23 | Discharge: 2022-08-23 | Disposition: A | Discharge status: Home / Self Care | Payer: 59 | Source: Ambulatory Visit | Attending: Urology | Admitting: Urology

## 2022-08-23 DIAGNOSIS — N281 Cyst of kidney, acquired: Secondary | ICD-10-CM

## 2022-08-23 MED ORDER — GADOPICLENOL 0.5 MMOL/ML IV SOLN
10.0000 mL | Freq: Once | INTRAVENOUS | Status: AC | PRN
  Administered 2022-08-23: 10 mL via INTRAVENOUS

## 2022-08-23 NOTE — Telephone Encounter (Signed)
I have left a message x 2 to call back to set up Itamar study

## 2022-08-28 ENCOUNTER — Encounter: Payer: Self-pay | Admitting: Physician Assistant

## 2022-08-28 ENCOUNTER — Ambulatory Visit (INDEPENDENT_AMBULATORY_CARE_PROVIDER_SITE_OTHER): Payer: 59 | Admitting: Physician Assistant

## 2022-08-28 VITALS — BP 110/72 | HR 78 | Temp 97.7°F | Ht 63.5 in | Wt 242.0 lb

## 2022-08-28 DIAGNOSIS — G629 Polyneuropathy, unspecified: Secondary | ICD-10-CM | POA: Diagnosis not present

## 2022-08-28 DIAGNOSIS — I10 Essential (primary) hypertension: Secondary | ICD-10-CM

## 2022-08-28 DIAGNOSIS — J4540 Moderate persistent asthma, uncomplicated: Secondary | ICD-10-CM

## 2022-08-28 MED ORDER — GABAPENTIN 300 MG PO CAPS: ORAL_CAPSULE | ORAL | 1 refills | Status: DC

## 2022-08-28 NOTE — Patient Instructions (Addendum)
It was great to see you!  I will refill gabapentin for you  Keep up the good work  Let's follow-up in February for blood work  Take care,  [employer] PA-C

## 2022-08-28 NOTE — Progress Notes (Signed)
Tammy Avery is a 61 y.o. female here for a follow up of a pre-existing problem.  History of Present Illness:   Chief Complaint  Patient presents with   Hypertension    Pt is checking blood pressure at home averaging 125/70.    HPI  Patient is in today for an office visit.  URI; Moderate persistent allergic asthma Patient reports that she is experiencing a head cold. Has nasal congestion. Denies: fever, chills, significant cough, n/v/d. Currently using Breo Ellipta daily She is requesting a doctor's note prohibiting her from using machinery at her work. Using certain machines causes flare in her allergies/asthma due to dust.  HTN Currently taking amlodipine 10 mg daily, carvedilol 25 mg BID, lasix 60 mg, losartan 100 mg. At home blood pressure readings are: normal. Patient denies chest pain, SOB, blurred vision, dizziness, unusual headaches, lower leg swelling. Patient is  compliant with medication. Denies excessive caffeine intake, stimulant usage, excessive alcohol intake, or increase in salt consumption.  BP Readings from Last 3 Encounters:  08/28/22 110/72  07/31/22 136/78  06/24/22 132/78   Neuropathy She is requesting a doctor's note prohibiting her from using machinery at her work. Using certain machines causes flare in her neuropathy. Needs refill on gabapentin 300 mg daily.      Past Medical History:  Diagnosis Date   Anemia    C. difficile diarrhea    2015 -- caught from mother   Carpal tunnel syndrome    Chronic migraine    Congenital cataract    left eye, blind in left eye   Connective tissue disease ([place])    Diverticulitis    distal colon -- confrimed on CT on 01/16/16 and colonoscopy on 08/29/15   H/O hiatal hernia    no meds - no problems   Headache(784.0)    cannot tolerate many NSAIDs, would do injection of depomedrol in office   HLA B27 (HLA B27 positive)    Hyperlipidemia    Hypertensive retinopathy    Hypothyroidism    well controlled    Kidney stones    Memory loss    associated with peudotumor cerebri   Obesity, morbid (HCC)    Osteoarthritis    Polyarthritis, 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HCC)    Prediabetes    Pseudotumor cerebri syndrome    on lasix   Radiculopathy    Seasonal allergies    Seizures ([place])     on meds - last one 10/18/11- meds increased -- thinks of soma   Sinusitis    Sleep apnea    Does use CPAP   Spondylarthritis    Transient alteration of awareness    Uterine fibroid    Vitamin D deficiency      Social History   Tobacco Use   Smoking status: Never   Smokeless tobacco: Never  Substance Use Topics   Alcohol use: Yes    Alcohol/week: 1.0 standard drink of alcohol    Types: 1 Glasses of wine per week   Drug use: No    Past Surgical History:  Procedure Laterality Date   BACK SURGERY  09/28/2007   Degenerative Disc Disease - surgery on  L3/4, L4/5   EYE SURGERY  1975   left eye cataract surgery   IUD REMOVAL  01/09/2012   Procedure: INTRAUTERINE DEVICE (IUD) REMOVAL;  Surgeon: Marvene Staff, MD;  Location: [facility];  Service: Gynecology;  Laterality: N/A;   OVARIAN CYST REMOVAL  01/09/2012   Procedure: OVARIAN CYSTECTOMY;  Surgeon: Marvene Staff,  MD;  Location: [facility];  Service: Gynecology;  Laterality: Right;   right knee arthroscopic  09/24/2006   TONSILLECTOMY     VESICOVAGINAL FISTULA CLOSURE W/ TAH     for fibroids    Family History  Problem Relation Age of Onset   Hypertension Mother    Heart disease Mother    Diabetes Mother    Lupus Mother    Dementia Mother    CAD Father    Heart disease Father    Hypertension Sister    Breast cancer Sister    Hypertension Brother    Diabetes Brother    Heart disease Maternal Grandmother    CVA Maternal Grandmother    Colon cancer Neg Hx    Asthma Neg Hx     Allergies  Allergen Reactions   Shellfish Allergy Itching   Advil [Ibuprofen]     Rapid heartbeat   Aleve [Naproxen Sodium]     dizziness Nausea -- only causes if  this takes regularly   Aspirin Tinitus   Benadryl [Diphenhydramine] Hives and Other (See Comments)    fatigue   Doxycycline Other (See Comments)    Kidney stones   Lactose Intolerance (Gi) Diarrhea   Limbrel [Flavocoxid]     Diarrhea     Mobic [Meloxicam]     vomiting    Neosporin [Neomycin-Bacitracin Zn-Polymyx]     Causes infection   Soma [Carisoprodol] Other (See Comments)    seizures   Codeine Rash    Patient states that Codeine in liquid form causes her rash reaction.  But other forms of codeine are okay for patient to take.   Latex Rash   Morphine And Related Itching and Rash   Penicillins Rash   Prednisone Palpitations    Weight gain    Current Medications:   Current Outpatient Medications:    acetaminophen (TYLENOL) 500 MG tablet, Take 500 mg by mouth every 6 (six) hours as needed. Patient used this medication for pain., Disp: , Rfl:    albuterol (VENTOLIN HFA) 108 (90 Base) MCG/ACT inhaler, Inhale 2 puffs into the lungs every 6 (six) hours as needed for wheezing or shortness of breath., Disp: 8 g, Rfl: 0   amLODipine (NORVASC) 10 MG tablet, Take 1 tablet (10 mg total) by mouth daily., Disp: 90 tablet, Rfl: 1   Biotin 2.5 MG CAPS, Take 1 capsule by mouth daily., Disp: , Rfl:    Calcium 600-10 MG-MCG CHEW, Chew 600 mg by mouth daily., Disp: , Rfl:    carvedilol (COREG) 25 MG tablet, Take 1 tablet (25 mg total) by mouth 2 (two) times daily with a meal., Disp: 180 tablet, Rfl: 1   cetirizine (ZYRTEC) 10 MG tablet, Take 1 tablet (10 mg total) by mouth daily., Disp: 30 tablet, Rfl: 11   Cholecalciferol (VITAMIN D-3) 5000 units TABS, Takes one tablet daily., Disp: 30 tablet, Rfl:    Clobetasol & Clobetasol Emul 0.05 & 0.05 % MISC, Apply 1 application topically 2 (two) times daily as needed. , Disp: , Rfl:    CRANBERRY PO, Take 2 capsules by mouth daily in the afternoon. 4200 mg, Disp: , Rfl:    Cranberry-Cholecalciferol 4200-500 MG-UNIT CAPS, Take 4,200 mg by mouth. Take 2  daily, Disp: , Rfl:    dextromethorphan (DELSYM) 30 MG/5ML liquid, Take by mouth as needed for cough., Disp: , Rfl:    diclofenac sodium (VOLTAREN) 1 % GEL, Apply 2 g topically 4 (four) times daily., Disp: , Rfl:    fluticasone furoate-vilanterol (  BREO ELLIPTA) 200-25 MCG/ACT AEPB, Inhale 1 puff into the lungs daily., Disp: 1 each, Rfl: 5   furosemide (LASIX) 40 MG tablet, Take 1.5 tablets (60 mg total) by mouth daily., Disp: 90 tablet, Rfl: 1   hydroxychloroquine (PLAQUENIL) 200 MG tablet, Take 400 mg by mouth daily., Disp: , Rfl:    levothyroxine (SYNTHROID) 75 MCG tablet, TAKE 1 TABLET(75 MCG) BY MOUTH DAILY BEFORE BREAKFAST, Disp: 90 tablet, Rfl: 0   losartan (COZAAR) 100 MG tablet, Take 1 tablet (100 mg total) by mouth daily., Disp: 90 tablet, Rfl: 3   Multiple Vitamin (MULTIVITAMIN) tablet, Take 2 tablets by mouth daily., Disp: , Rfl:    naproxen sodium (ALEVE) 220 MG tablet, Take 1-2 tablets by mouth as needed., Disp: , Rfl:    potassium chloride (KLOR-CON M) 10 MEQ tablet, Take 1 tablet (10 mEq total) by mouth 2 (two) times daily., Disp: 180 tablet, Rfl: 1   Pyridoxine HCl (VITAMIN B6) 200 MG TABS, Take one tablet daily., Disp: 30 tablet, Rfl:    zonisamide (ZONEGRAN) 100 MG capsule, Take 400 mg by mouth daily. , Disp: , Rfl:    gabapentin (NEURONTIN) 300 MG capsule, TAKE 1 CAPSULE(300 MG) BY MOUTH AT BEDTIME, Disp: 90 capsule, Rfl: 1   rosuvastatin (CRESTOR) 20 MG tablet, Take 1 tablet (20 mg total) by mouth daily., Disp: 90 tablet, Rfl: 3   Review of Systems:   ROS Negative unless otherwise specified per HPI.  Vitals:   Vitals:   08/28/22 0921  BP: 110/72  Pulse: 78  Temp: 97.7 F (36.5 C)  TempSrc: Temporal  SpO2: 98%  Weight: 242 lb (109.8 kg)  Height: 5' 3.5" (1.613 m)     Body mass index is 42.2 kg/m.  Physical Exam:   Physical Exam Constitutional:      General: She is not in acute distress.    Appearance: Normal appearance. She is not ill-appearing.  HENT:      Head: Normocephalic and atraumatic.     Right Ear: External ear normal.     Left Ear: External ear normal.  Eyes:     Extraocular Movements: Extraocular movements intact.     Pupils: Pupils are equal, round, and reactive to light.  Cardiovascular:     Rate and Rhythm: Normal rate and regular rhythm.     Heart sounds: Normal heart sounds. No murmur heard.    No gallop.  Pulmonary:     Effort: Pulmonary effort is normal. No respiratory distress.     Breath sounds: Normal breath sounds. No wheezing or rales.  Skin:    General: Skin is warm and dry.  Neurological:     Mental Status: She is alert and oriented to person, place, and time.  Psychiatric:        Judgment: Judgment normal.     Assessment and Plan:   Moderate persistent allergic asthma Well controlled per patient Continue Breo-Ellipta Work note provided  Nasal congestion Continue to monitor She declines intervention at this time No red flags  Essential hypertension Well controlled Continue amlodipine 10 mg daily, carvedilol 25 mg BID, lasix 60 mg, losartan 100 mg.  Neuropathy Well controlled Refill gabapentin 300 mg daily Work note provided  Architectural technologist as a Education administrator for [employer], PA.,have documented all relevant documentation on the behalf of Inda Coke, PA,as directed by  Inda Coke, PA while in the presence of Inda Coke, [place].  I, Inda Coke, [place], have reviewed all documentation for this visit. The documentation on 08/28/22  for the exam, diagnosis, procedures, and orders are all accurate and complete.  Inda Coke, PA-C

## 2022-08-29 LAB — CBC AND DIFFERENTIAL
HCT: 40 (ref 36–46)
Hemoglobin: 13.2 (ref 12.0–16.0)
Neutrophils Absolute: 2.9
Platelets: 233 10*3/uL (ref 150–400)
WBC: 5.5

## 2022-08-29 LAB — BASIC METABOLIC PANEL
BUN: 20 (ref 4–21)
CO2: 23 — AB (ref 13–22)
Chloride: 106 (ref 99–108)
Creatinine: 1.2 — AB (ref 0.5–1.1)
Glucose: 91
Potassium: 3.9 mEq/L (ref 3.5–5.1)
Sodium: 144 (ref 137–147)

## 2022-08-29 LAB — HEPATIC FUNCTION PANEL
ALT: 36 U/L — AB (ref 7–35)
AST: 39 — AB (ref 13–35)
Alkaline Phosphatase: 93 (ref 25–125)
Bilirubin, Total: 0.2

## 2022-08-29 LAB — COMPREHENSIVE METABOLIC PANEL
Albumin: 4.1 (ref 3.5–5.0)
Calcium: 9.7 (ref 8.7–10.7)
Globulin: 2.5
eGFR: 51

## 2022-08-29 LAB — CBC: RBC: 4.62 (ref 3.87–5.11)

## 2022-08-30 ENCOUNTER — Other Ambulatory Visit: Payer: Self-pay | Admitting: Physician Assistant

## 2022-09-02 NOTE — Telephone Encounter (Signed)
I have tried to reach the x 3 to set up Itamar sleep study. I will send out a letter to call the office.

## 2022-09-04 ENCOUNTER — Ambulatory Visit (INDEPENDENT_AMBULATORY_CARE_PROVIDER_SITE_OTHER): Payer: 59

## 2022-09-04 DIAGNOSIS — Z23 Encounter for immunization: Secondary | ICD-10-CM

## 2022-09-15 ENCOUNTER — Other Ambulatory Visit: Payer: Self-pay | Admitting: Physician Assistant

## 2022-09-18 ENCOUNTER — Ambulatory Visit: Payer: 59

## 2022-09-19 ENCOUNTER — Ambulatory Visit: Payer: 59 | Admitting: Physician Assistant

## 2022-09-24 ENCOUNTER — Encounter: Payer: Self-pay | Admitting: Physician Assistant

## 2022-09-27 ENCOUNTER — Ambulatory Visit: Payer: 59 | Admitting: Internal Medicine

## 2022-09-30 NOTE — Progress Notes (Deleted)
Cardiology Office Note:    Date:  09/30/2022   ID:  Tammy Avery, DOB 1961/01/28, MRN [id number]  PCP:  Inda Coke, PA   [facility] Providers Cardiologist:  Candee Furbish, MD Cardiology APP:  Liliane Shi, PA-C { Click to update primary MD,subspecialty MD or APP then REFRESH:1}    Referring MD: Inda Coke, PA   Chief Complaint: ***  History of Present Illness:    Tammy Avery is a *** 61 y.o. female with a hx of HTN, left anterior fascicular block, OSA, HLD, morbid obesity, coronary calcification on CT, aortic atherosclerosis, family history of early CAD, hypothyroidism, prediabetes, seizures, and left adrenal mass.  She is followed by [facility] neurology for seizures.  Works at the [employer].  Referred to cardiology for family history of early CAD.  She had a nuclear stress test 2016 that was normal, LVEF 55 to 65%.  Seen by Dr. Marlou Porch 11/23/2021.  She reported shortness of breath when walking uphill, otherwise no significant chest pain.  Calcium score ordered which revealed calcium score of 183, 95th percentile for age/sex, aortic atherosclerosis noted.  He was advised to stop pravastatin and start rosuvastatin 20 mg once daily with follow-up fasting labs planned.  Last cardiology clinic visit was 06/24/2022 with Finis Bud, NP.  She reported an episode of wheezing on a hot day when wearing a mask that sounded like an asthma exacerbation.  She reported noncompliance with CPAP due to mask making noise, she was asked to follow-up with our sleep team. 2D echo 07/04/22 revealed normal LVEF, mild LVH and grade 1 diastolic dysfunciton, severely dilated LA, no significant valve disease.  Borderline dilatation of the ascending aorta measuring 37 mm. She was advised to return in 3 months for follow-up.  Today, she is here   Past Medical History:  Diagnosis Date   Anemia    C. difficile diarrhea    2015 -- caught from mother   Carpal tunnel syndrome    Chronic  migraine    Congenital cataract    left eye, blind in left eye   Connective tissue disease ([place])    Diverticulitis    distal colon -- confrimed on CT on 01/16/16 and colonoscopy on 08/29/15   H/O hiatal hernia    no meds - no problems   Headache(784.0)    cannot tolerate many NSAIDs, would do injection of depomedrol in office   HLA B27 (HLA B27 positive)    Hyperlipidemia    Hypertensive retinopathy    Hypothyroidism    well controlled   Kidney stones    Memory loss    associated with peudotumor cerebri   Obesity, morbid (HCC)    Osteoarthritis    Polyarthritis, 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[place])    Prediabetes    Pseudotumor cerebri syndrome    on lasix   Radiculopathy    Seasonal allergies    Seizures ([place])     on meds - last one 10/18/11- meds increased -- thinks of soma   Sinusitis    Sleep apnea    Does use CPAP   Spondylarthritis    Transient alteration of awareness    Uterine fibroid    Vitamin D deficiency     Past Surgical History:  Procedure Laterality Date   BACK SURGERY  09/28/2007   Degenerative Disc Disease - surgery on  L3/4, L4/5   EYE SURGERY  1975   left eye cataract surgery   IUD REMOVAL  01/09/2012   Procedure: INTRAUTERINE  DEVICE (IUD) REMOVAL;  Surgeon: Marvene Staff, MD;  Location: [facility];  Service: Gynecology;  Laterality: N/A;   OVARIAN CYST REMOVAL  01/09/2012   Procedure: OVARIAN CYSTECTOMY;  Surgeon: Marvene Staff, MD;  Location: [facility];  Service: Gynecology;  Laterality: Right;   right knee arthroscopic  09/24/2006   TONSILLECTOMY     VESICOVAGINAL FISTULA CLOSURE W/ TAH     for fibroids    Current Medications: No outpatient medications have been marked as taking for the 10/03/22 encounter (Appointment) with Ann Maki Lanice Schwab, NP.     Allergies:   Shellfish allergy, Advil [ibuprofen], Aleve [naproxen sodium], Aspirin, Benadryl [diphenhydramine], Doxycycline, Lactose intolerance (gi), Limbrel [flavocoxid], Mobic [meloxicam], Neosporin  [neomycin-bacitracin zn-polymyx], Soma [carisoprodol], Codeine, Latex, Morphine and related, Penicillins, and Prednisone   Social History   Socioeconomic History   Marital status: Single    Spouse name: Not on file   Number of children: Not on file   Years of education: Not on file   Highest education level: Not on file  Occupational History   Not on file  Tobacco Use   Smoking status: Never   Smokeless tobacco: Never  Substance and Sexual Activity   Alcohol use: Yes    Alcohol/week: 1.0 standard drink of alcohol    Types: 1 Glasses of wine per week   Drug use: No   Sexual activity: Never    Birth control/protection: None  Other Topics Concern   Not on file  Social History Narrative   Psychologist, 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 football ([employer], Doctor, general practice, Engineer, manufacturing systems)   Social Determinants of Radio broadcast assistant Strain: Not on file  Food Insecurity: Not on file  Transportation Needs: Not on file  Physical Activity: Not on file  Stress: Not on file  Social Connections: Not on file     Family History: The patient's ***family history includes Breast cancer in her sister; CAD in her father; CVA in her maternal grandmother; Dementia in her mother; Diabetes in her brother and mother; Heart disease in her father, maternal grandmother, and mother; Hypertension in her brother, mother, and sister; Lupus in her mother. There is no history of Colon cancer or Asthma.  ROS:   Please see the history of present illness.    *** All other systems reviewed and are negative.  Labs/Other Studies Reviewed:    The following studies were reviewed today:  Echo 07/04/2022  1. Left ventricular ejection fraction by 3D volume is 67 %. The left  ventricle has normal function. The left ventricle has no regional wall  motion abnormalities. There is mild concentric left ventricular  hypertrophy. Left ventricular diastolic  parameters are consistent with Grade I diastolic  dysfunction (impaired  relaxation).   2. Right ventricular systolic function is normal. The right ventricular  size is normal. Tricuspid regurgitation signal is inadequate for assessing  PA pressure.   3. Left atrial size was severely dilated.   4. The mitral valve is normal in structure. No evidence of mitral valve  regurgitation. No evidence of mitral stenosis.   5. The aortic valve is normal in structure. Aortic valve regurgitation is  not visualized. No aortic stenosis is present.   6. There is borderline dilatation of the ascending aorta, measuring 37  mm.   7. The inferior vena cava is normal in size with greater than 50%  respiratory variability, suggesting right atrial pressure of 3 mmHg.   Comparison(s): No prior  Echocardiogram.  CT Cardiac Scoring 12/20/21  Coronary calcium score of 183. This was 95 percentile for age-, race-, and sex-matched controls.   Aortic atherosclerosis.  Lexiscan Myoview 07/2015  The left ventricular ejection fraction is normal (55-65%).    Nuclear stress EF: 56%.    There was no ST segment deviation noted during stress.    The study is normal.    This is a low risk study.   No evidence of ischemia or scar. Reassuring NUC stress.  Recent Labs: 06/19/2022: TSH 2.84 08/29/2022: ALT 36; BUN 20; Creatinine 1.2; Hemoglobin 13.2; Platelets 233; Potassium 3.9; Sodium 144  Recent Lipid Panel    Component Value Date/Time   CHOL 161 02/25/2022 1015   TRIG 49 02/25/2022 1015   HDL 91 02/25/2022 1015   CHOLHDL 2.1 08/28/2020 0940   VLDL 16.0 04/26/2019 0836   LDLCALC 59 02/25/2022 1015   LDLCALC 84 08/28/2020 0940     Risk Assessment/Calculations:   {Does this patient have ATRIAL FIBRILLATION?:[id number]}       Physical Exam:    VS:  LMP 12/13/2011     Wt Readings from Last 3 Encounters:  08/28/22 242 lb (109.8 kg)  07/31/22 234 lb 3.2 oz (106.2 kg)  06/24/22 210 lb (95.3 kg)     GEN: *** Well nourished, well developed in no  acute distress HEENT: Normal NECK: No JVD; No carotid bruits CARDIAC: ***RRR, no murmurs, rubs, gallops RESPIRATORY:  Clear to auscultation without rales, wheezing or rhonchi  ABDOMEN: Soft, non-tender, non-distended MUSCULOSKELETAL:  No edema; No deformity. *** pedal pulses, ***bilaterally SKIN: Warm and dry NEUROLOGIC:  Alert and oriented x 3 PSYCHIATRIC:  Normal affect   EKG:  EKG is *** ordered today.  The ekg ordered today demonstrates ***  No BP recorded.  {Refresh Note OR Click here to enter BP  :1}***    Diagnoses:    No diagnosis found. Assessment and Plan:     Hypertension:  Dyspnea:  OSA: Plan for patient to undergo Itamar home sleep study  CAD without angina:  Hyperlipidemia LDL goal < 70: LDL 59 on 02/25/22  {Are you ordering a CV Procedure (e.g. stress test, cath, DCCV, TEE, etc)?   Press F2        :[id number]}   Disposition:  Medication Adjustments/Labs and Tests Ordered: Current medicines are reviewed at length with the patient today.  Concerns regarding medicines are outlined above.  No orders of the defined types were placed in this encounter.  No orders of the defined types were placed in this encounter.   There are no Patient Instructions on file for this visit.   Signed, Emmaline Life, NP  09/30/2022 5:19 PM    [place]

## 2022-10-02 ENCOUNTER — Telehealth: Payer: Self-pay | Admitting: Nurse Practitioner

## 2022-10-02 NOTE — Telephone Encounter (Signed)
Patient is calling requesting her appt for tomorrow be made virtual due to her currently having a head cold. Please advise.

## 2022-10-02 NOTE — Telephone Encounter (Signed)
S/w pt, patient is sick and trying to get in with PCP.  Move pt's appt too Wednesday, November 22. Will send to [facility] to [place].

## 2022-10-03 ENCOUNTER — Ambulatory Visit: Payer: 59 | Admitting: Nurse Practitioner

## 2022-10-07 ENCOUNTER — Ambulatory Visit: Payer: 59 | Admitting: Internal Medicine

## 2022-10-07 ENCOUNTER — Encounter: Payer: Self-pay | Admitting: Internal Medicine

## 2022-10-07 ENCOUNTER — Ambulatory Visit (INDEPENDENT_AMBULATORY_CARE_PROVIDER_SITE_OTHER): Payer: 59 | Admitting: Internal Medicine

## 2022-10-07 VITALS — BP 120/82 | HR 76 | Temp 98.5°F | Ht 63.5 in | Wt 239.2 lb

## 2022-10-07 DIAGNOSIS — J4540 Moderate persistent asthma, uncomplicated: Secondary | ICD-10-CM | POA: Diagnosis not present

## 2022-10-07 MED ORDER — FLUTICASONE-SALMETEROL 115-21 MCG/ACT IN AERO: 2.0000 | INHALATION_SPRAY | Freq: Two times a day (BID) | RESPIRATORY_TRACT | 12 refills | Status: DC

## 2022-10-07 NOTE — Patient Instructions (Addendum)
Please schedule follow up scheduled with APP in 2 months.  If my schedule is not open yet, we will contact you with a reminder closer to that time. Please call [phone] if you haven't heard from [place] a month before.   Stop Breo. Switch to advair  115 HFA 2 puffs twice a day. Gargle after use. Continue albuterol inhaler as needed. Come see [place] sooner if issues or concerns about your breathing.

## 2022-10-07 NOTE — Progress Notes (Unsigned)
Cardiology Office Note:    Date:  10/09/2022   ID:  Tammy Avery, DOB Feb 16, 1961, MRN [id number]  PCP:  Inda Coke, PA   [facility] Providers Cardiologist:  Candee Furbish, MD Cardiology APP:  Sharmon Revere     Referring MD: Inda Coke, [place]   Chief Complaint: shortness of breath  History of Present Illness:    Tammy Avery is a very pleasant 61 y.o. female with a hx of HTN, left anterior fascicular block, OSA, HLD, morbid obesity, coronary calcification on CT, aortic atherosclerosis, family history of early CAD, hypothyroidism, prediabetes, seizures, and left adrenal mass.  She is followed by [facility] neurology for seizures.  Works at the [employer].  Referred to cardiology for family history of early CAD.  She had a nuclear stress test 2016 that was normal, LVEF 55 to 65%.  Seen by Dr. Marlou Porch 11/23/2021.  She reported shortness of breath when walking uphill, otherwise no significant chest pain.  Calcium score ordered which revealed calcium score of 183, 95th percentile for age/sex, aortic atherosclerosis noted.  She was advised to stop pravastatin and start rosuvastatin 20 mg once daily with follow-up fasting labs planned.  Last cardiology clinic visit was 06/24/2022 with Finis Bud, NP.  She reported an episode of wheezing on a hot day when wearing a mask that sounded like an asthma exacerbation.  She reported noncompliance with CPAP due to mask making noise, she was asked to follow-up with our sleep team. 2D echo 07/04/22 revealed normal LVEF, mild LVH and grade 1 diastolic dysfunciton, severely dilated LA, no significant valve disease.  Borderline dilatation of the ascending aorta measuring 37 mm. She was advised to return in 3 months for follow-up.  Today, she is here for follow-up of shortness of breath.  We reviewed echo findings from August.  She continues to have some dyspnea with moderate to extreme exertion. No orthopnea, PND. Has mild bilateral LE  edema. She saw pulmonology on 10/08/2022 and was prescribed a new asthma medication but is waiting for pharmacy to get it. No further episodes like described at last office visit.  She would like to proceed with getting home sleep study. She denies chest pain, shortness of breath, lower extremity edema, fatigue, palpitations, melena, hematuria, hemoptysis, diaphoresis, weakness, presyncope, syncope, orthopnea, and PND.  Past Medical History:  Diagnosis Date   Anemia    C. difficile diarrhea    2015 -- caught from mother   Carpal tunnel syndrome    Chronic migraine    Congenital cataract    left eye, blind in left eye   Connective tissue disease ([place])    Diverticulitis    distal colon -- confrimed on CT on 01/16/16 and colonoscopy on 08/29/15   H/O hiatal hernia    no meds - no problems   Headache(784.0)    cannot tolerate many NSAIDs, would do injection of depomedrol in office   HLA B27 (HLA B27 positive)    Hyperlipidemia    Hypertensive retinopathy    Hypothyroidism    well controlled   Kidney stones    Memory loss    associated with peudotumor cerebri   Obesity, morbid (HCC)    Osteoarthritis    Polyarthritis, 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HCC)    Prediabetes    Pseudotumor cerebri syndrome    on lasix   Radiculopathy    Seasonal allergies    Seizures ([place])     on meds - last one 10/18/11- meds increased -- thinks  of soma   Sinusitis    Sleep apnea    Does use CPAP   Spondylarthritis    Transient alteration of awareness    Uterine fibroid    Vitamin D deficiency     Past Surgical History:  Procedure Laterality Date   BACK SURGERY  09/28/2007   Degenerative Disc Disease - surgery on  L3/4, L4/5   EYE SURGERY  1975   left eye cataract surgery   IUD REMOVAL  01/09/2012   Procedure: INTRAUTERINE DEVICE (IUD) REMOVAL;  Surgeon: Marvene Staff, MD;  Location: [facility];  Service: Gynecology;  Laterality: N/A;   OVARIAN CYST REMOVAL  01/09/2012   Procedure: OVARIAN CYSTECTOMY;   Surgeon: Marvene Staff, MD;  Location: [facility];  Service: Gynecology;  Laterality: Right;   right knee arthroscopic  09/24/2006   TONSILLECTOMY     VESICOVAGINAL FISTULA CLOSURE W/ TAH     for fibroids    Current Medications: Current Meds  Medication Sig   acetaminophen (TYLENOL) 500 MG tablet Take 500 mg by mouth every 6 (six) hours as needed. Patient used this medication for pain.   albuterol (VENTOLIN HFA) 108 (90 Base) MCG/ACT inhaler Inhale 2 puffs into the lungs every 6 (six) hours as needed for wheezing or shortness of breath.   amLODipine (NORVASC) 10 MG tablet Take 1 tablet (10 mg total) by mouth daily.   Biotin 2.5 MG CAPS Take 1 capsule by mouth daily.   Calcium 600-10 MG-MCG CHEW Chew 600 mg by mouth daily.   carvedilol (COREG) 25 MG tablet Take 1 tablet (25 mg total) by mouth 2 (two) times daily with a meal.   cetirizine (ZYRTEC) 10 MG tablet Take 1 tablet (10 mg total) by mouth daily.   Cholecalciferol (VITAMIN D-3) 5000 units TABS Takes one tablet daily.   Clobetasol & Clobetasol Emul 0.05 & 0.05 % MISC Apply 1 application topically 2 (two) times daily as needed.    CRANBERRY PO Take 2 capsules by mouth daily in the afternoon. 4200 mg   Cranberry-Cholecalciferol 4200-500 MG-UNIT CAPS Take 4,200 mg by mouth. Take 2 daily   dextromethorphan (DELSYM) 30 MG/5ML liquid Take by mouth as needed for cough.   diclofenac sodium (VOLTAREN) 1 % GEL Apply 2 g topically 4 (four) times daily.   fluticasone-salmeterol (ADVAIR HFA) 115-21 MCG/ACT inhaler Inhale 2 puffs into the lungs 2 (two) times daily.   furosemide (LASIX) 40 MG tablet TAKE 1 AND 1/2 TABLETS(60 MG) BY MOUTH DAILY   gabapentin (NEURONTIN) 300 MG capsule TAKE 1 CAPSULE(300 MG) BY MOUTH AT BEDTIME   hydroxychloroquine (PLAQUENIL) 200 MG tablet Take 400 mg by mouth daily.   levothyroxine (SYNTHROID) 75 MCG tablet TAKE 1 TABLET(75 MCG) BY MOUTH DAILY BEFORE BREAKFAST   losartan (COZAAR) 100 MG tablet TAKE 1 TABLET(100 MG)  BY MOUTH DAILY   Multiple Vitamin (MULTIVITAMIN) tablet Take 2 tablets by mouth daily.   potassium chloride (KLOR-CON M) 10 MEQ tablet Take 1 tablet (10 mEq total) by mouth 2 (two) times daily.   Pyridoxine HCl (VITAMIN B6) 200 MG TABS Take one tablet daily.   zonisamide (ZONEGRAN) 100 MG capsule Take 400 mg by mouth daily.      Allergies:   Shellfish allergy, Advil [ibuprofen], Aleve [naproxen sodium], Aspirin, Benadryl [diphenhydramine], Doxycycline, Lactose intolerance (gi), Limbrel [flavocoxid], Mobic [meloxicam], Neosporin [neomycin-bacitracin zn-polymyx], Soma [carisoprodol], Codeine, Latex, Morphine and related, Penicillins, and Prednisone   Social History   Socioeconomic History   Marital status: Single  Spouse name: Not on file   Number of children: Not on file   Years of education: Not on file   Highest education level: Not on file  Occupational History   Not on file  Tobacco Use   Smoking status: Never   Smokeless tobacco: Never  Substance and Sexual Activity   Alcohol use: Yes    Alcohol/week: 1.0 standard drink of alcohol    Types: 1 Glasses of wine per week   Drug use: No   Sexual activity: Never    Birth control/protection: None  Other Topics Concern   Not on file  Social History Narrative   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[place]   Single   No children   Fun: fantasy football ([employer], Doctor, general practice, Engineer, manufacturing systems)   Social Determinants of Radio broadcast assistant Strain: Not on file  Food Insecurity: Not on file  Transportation Needs: Not on file  Physical Activity: Not on file  Stress: Not on file  Social Connections: Not on file     Family History: The patient's family history includes Breast cancer in her sister; CAD in her father; CVA in her maternal grandmother; Dementia in her mother; Diabetes in her brother and mother; Heart disease in her father, maternal grandmother, and mother; Hypertension in her brother, mother, and sister; Lupus in her mother. There is no history  of Colon cancer or Asthma.  ROS:   Please see the history of present illness.    + snoring + mild bilateral LE edema + DOE All other systems reviewed and are negative.  Labs/Other Studies Reviewed:    The following studies were reviewed today:  Echo 07/04/2022  1. Left ventricular ejection fraction by 3D volume is 67 %. The left  ventricle has normal function. The left ventricle has no regional wall  motion abnormalities. There is mild concentric left ventricular  hypertrophy. Left ventricular diastolic  parameters are consistent with Grade I diastolic dysfunction (impaired  relaxation).   2. Right ventricular systolic function is normal. The right ventricular  size is normal. Tricuspid regurgitation signal is inadequate for assessing  PA pressure.   3. Left atrial size was severely dilated.   4. The mitral valve is normal in structure. No evidence of mitral valve  regurgitation. No evidence of mitral stenosis.   5. The aortic valve is normal in structure. Aortic valve regurgitation is  not visualized. No aortic stenosis is present.   6. There is borderline dilatation of the ascending aorta, measuring 37  mm.   7. The inferior vena cava is normal in size with greater than 50%  respiratory variability, suggesting right atrial pressure of 3 mmHg.   Comparison(s): No prior Echocardiogram.  CT Cardiac Scoring 12/20/21  Coronary calcium score of 183. This was 95 percentile for age-, race-, and sex-matched controls.   Aortic atherosclerosis.  Lexiscan Myoview 07/2015  The left ventricular ejection fraction is normal (55-65%).    Nuclear stress EF: 56%.    There was no ST segment deviation noted during stress.    The study is normal.    This is a low risk study.   No evidence of ischemia or scar. Reassuring NUC stress.  Recent Labs: 06/19/2022: TSH 2.84 08/29/2022: ALT 36; BUN 20; Creatinine 1.2; Hemoglobin 13.2; Platelets 233; Potassium 3.9; Sodium 144  Recent Lipid  Panel    Component Value Date/Time   CHOL 161 02/25/2022 1015   TRIG 49 02/25/2022 1015   HDL 91 02/25/2022 1015   CHOLHDL 2.1 08/28/2020 0940  VLDL 16.0 04/26/2019 0836   LDLCALC 59 02/25/2022 1015   LDLCALC 84 08/28/2020 0940     Risk Assessment/Calculations:      Physical Exam:    VS:  BP 118/70   Pulse 69   Ht 5' 3.5" (1.613 m)   Wt 234 lb 3.2 oz (106.2 kg)   LMP 12/13/2011   SpO2 99%   BMI 40.84 kg/m     Wt Readings from Last 3 Encounters:  10/09/22 234 lb 3.2 oz (106.2 kg)  10/07/22 239 lb 3.2 oz (108.5 kg)  08/28/22 242 lb (109.8 kg)     GEN: Obese, no acute distress HEENT: Normal NECK: No JVD; No carotid bruits CARDIAC: RRR, no murmurs, rubs, gallops RESPIRATORY:  Clear to auscultation without rales, wheezing or rhonchi  ABDOMEN: Soft, non-tender, non-distended MUSCULOSKELETAL:  Mild bilateral LE edema; No deformity. 2+ pedal pulses, equal bilaterally SKIN: Warm and dry NEUROLOGIC:  Alert and oriented x 3 PSYCHIATRIC:  Normal affect   EKG:  EKG is not ordered today.    Diagnoses:    1. Dyspnea on exertion   2. Obstructive sleep apnea   3. Morbid obesity ([place])   4. Hyperlipidemia LDL goal <70   5. Coronary artery disease involving native coronary artery of native heart without angina pectoris   6. Chronic heart failure with preserved ejection fraction (HFpEF) (HCC)    Assessment and Plan:     Dyspnea/Chronic HFpEF: Normal LVEF, mild LVH and grade 1 diastolic dysfunction on echo 06/2022. Body habitus makes it difficult to assess volume status.  Suspect that dyspnea is multifactorial in the setting of HFpEF, obesity, and asthma. Encouraged her to limit fluids to 64 oz daily, limit sodium to under 2 grams daily, and to monitor daily weight. Encouraged weight loss. Continue GDMT including losartan, carvedilol, Lasix.    OSA: Severely dilated left atrium on echo 06/2022.  Previously referred for home sleep study. We will get that set up today. Emphasized  the importance of managing sleep apnea in the setting of atrial enlargement as this may lead to atrial fibrillation.  Obesity: Encouraged weight loss. Endorsed heart healthy low sodium diet and regular exercise.   CAD without angina: She denies chest pain, dyspnea, or other symptoms concerning for angina.  No indication for further ischemic evaluation at this time.  Hyperlipidemia LDL goal < 70: LDL 59 on 02/25/22.  Continue rosuvastatin.     Disposition: 6 months with Dr. Marlou Porch or APP  Medication Adjustments/Labs and Tests Ordered: Current medicines are reviewed at length with the patient today.  Concerns regarding medicines are outlined above.  No orders of the defined types were placed in this encounter.  No orders of the defined types were placed in this encounter.   Patient Instructions  Medication Instructions:   Your physician recommends that you continue on your current medications as directed. Please refer to the Current Medication list given to you today.   *If you need a refill on your cardiac medications before your next appointment, please call your pharmacy*   Lab Work:  None ordered.  If you have labs (blood work) drawn today and your tests are completely normal, you will receive your results only by: [place] (if you have MyChart) OR A paper copy in the mail If you have any lab test that is abnormal or we need to change your treatment, we will call you to review the results.   Testing/Procedures:  The problem of recurrent insomnia is discussed. Avoidance of caffeine  sources is strongly encouraged. Sleep hygiene issues are reviewed. The use of sedative hypnotics for temporary relief is appropriate; we discussed the addictive nature of these drugs, and a one-time only prescription for prn use of a hypnotic is given, to use no more than 3 times per week for 2-3 weeks.    Follow-Up: At [facility], you and your health needs are our priority.   As part of our continuing mission to provide you with exceptional heart care, we have created designated Provider Care Teams.  These Care Teams include your primary Cardiologist (physician) and Advanced Practice Providers (APPs -  Physician Assistants and Nurse Practitioners) who all work together to provide you with the care you need, when you need it.  We recommend signing up for the patient portal called "MyChart".  Sign up information is provided on this After Visit Summary.  MyChart is used to connect with patients for Virtual Visits (Telemedicine).  Patients are able to view lab/test results, encounter notes, upcoming appointments, etc.  Non-urgent messages can be sent to your provider as well.   To learn more about what you can do with MyChart, go to [email or web address].    Your next appointment:   6 month(s)  The format for your next appointment:   In Person  Provider:   Candee Furbish, MD     Other Instructions  Heart Failure Education: Weigh yourself EVERY morning after you go to the bathroom but before you eat or drink anything. Write this number down in a weight log/diary. If you gain 3 pounds overnight or 5 pounds in a week, call the office. Take your medicines as prescribed. If you have concerns about your medications, please call us before you stop taking them.  Eat low salt foods--Limit salt (sodium) to 2000 mg per day. This will help prevent your body from holding onto fluid. Read food labels as many processed foods have a lot of sodium, especially canned goods and prepackaged meats. If you would like some assistance choosing low sodium foods, we would be happy to set you up with a nutritionist. Stay as active as you can everyday. Staying active will give you more energy and make your muscles stronger. Start with 5 minutes at a time and work your way up to 30 minutes a day. Break up your activities--do some in the morning and some in the afternoon. Start with 3 days per week and  work your way up to 5 days as you can.  If you have chest pain, feel short of breath, dizzy, or lightheaded, STOP. If you don't feel better after a short rest, call 911. If you do feel better, call the office to let us know you have symptoms with exercise. Limit all fluids for the day to less than 2 liters. Fluid includes all drinks, coffee, juice, ice chips, soup, jello, and all other liquids.   Important Information About Sugar         Signed, Emmaline Life, NP  10/09/2022 3:07 PM    [place]

## 2022-10-07 NOTE — Progress Notes (Signed)
Tammy Avery    [id number]    29-Jul-1961  Primary Care Physician:Worley, Aldona Bar, PA Date of Appointment: 10/07/2022 Established Patient Visit  Chief complaint:   Chief Complaint  Patient presents with   Follow-up     HPI: Tammy Avery is a 61 y.o. woman with asthma  Interval Updates:  Here for follow up after starting maintenance inhaler with breo 200 1 puff once daily. Feels no improvement on breo. Using rescue inhaler about once/week for symptoms related to dyspnea on exertion.   Her main issue is the dyspnea on exertion. Symptoms do improve with steroids predictable.    Current Regimen: breo 200 1 puff once/day. Albuterol prn Asthma Triggers: chronic allergic sinusitis Exacerbations in the last year: History of hospitalization or intubation: never Allergy Testing: never had GERD: yes on PPI Allergic Rhinitis: yes, chronic nasal congestion, zyrtec ACT:  Asthma Control Test ACT Total Score  10/07/2022  9:19 AM 16   FeNO:  I have reviewed the patient's family social and past medical history and updated as appropriate.   Past Medical History:  Diagnosis Date   Anemia    C. difficile diarrhea    2015 -- caught from mother   Carpal tunnel syndrome    Chronic migraine    Congenital cataract    left eye, blind in left eye   Connective tissue disease ([place])    Diverticulitis    distal colon -- confrimed on CT on 01/16/16 and colonoscopy on 08/29/15   H/O hiatal hernia    no meds - no problems   Headache(784.0)    cannot tolerate many NSAIDs, would do injection of depomedrol in office   HLA B27 (HLA B27 positive)    Hyperlipidemia    Hypertensive retinopathy    Hypothyroidism    well controlled   Kidney stones    Memory loss    associated with peudotumor cerebri   Obesity, morbid (HCC)    Osteoarthritis    Polyarthritis, 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HCC)    Prediabetes    Pseudotumor cerebri syndrome    on lasix   Radiculopathy    Seasonal allergies     Seizures ([place])     on meds - last one 10/18/11- meds increased -- thinks of soma   Sinusitis    Sleep apnea    Does use CPAP   Spondylarthritis    Transient alteration of awareness    Uterine fibroid    Vitamin D deficiency     Past Surgical History:  Procedure Laterality Date   BACK SURGERY  09/28/2007   Degenerative Disc Disease - surgery on  L3/4, L4/5   EYE SURGERY  1975   left eye cataract surgery   IUD REMOVAL  01/09/2012   Procedure: INTRAUTERINE DEVICE (IUD) REMOVAL;  Surgeon: Marvene Staff, MD;  Location: [facility];  Service: Gynecology;  Laterality: N/A;   OVARIAN CYST REMOVAL  01/09/2012   Procedure: OVARIAN CYSTECTOMY;  Surgeon: Marvene Staff, MD;  Location: [facility];  Service: Gynecology;  Laterality: Right;   right knee arthroscopic  09/24/2006   TONSILLECTOMY     VESICOVAGINAL FISTULA CLOSURE W/ TAH     for fibroids    Family History  Problem Relation Age of Onset   Hypertension Mother    Heart disease Mother    Diabetes Mother    Lupus Mother    Dementia Mother    CAD Father    Heart disease Father  Hypertension Sister    Breast cancer Sister    Hypertension Brother    Diabetes Brother    Heart disease Maternal Grandmother    CVA Maternal Grandmother    Colon cancer Neg Hx    Asthma Neg Hx     Social History   Occupational History   Not on file  Tobacco Use   Smoking status: Never   Smokeless tobacco: Never  Substance and Sexual Activity   Alcohol use: Yes    Alcohol/week: 1.0 standard drink of alcohol    Types: 1 Glasses of wine per week   Drug use: No   Sexual activity: Never    Birth control/protection: None     Physical Exam: Blood pressure 120/82, pulse 76, temperature 98.5 F (36.9 C), temperature source Oral, height 5' 3.5" (1.613 m), weight 239 lb 3.2 oz (108.5 kg), last menstrual period 12/13/2011, SpO2 97 %.  Gen:      No acute distress ENT:  no nasal polyps, mucus membranes moist Lungs:    No increased  respiratory effort, symmetric chest wall excursion, clear to auscultation bilaterally, no wheezes or crackles CV:         Regular rate and rhythm; no murmurs, rubs, or gallops.  No pedal edema   Data Reviewed: Imaging: I have personally reviewed the CTPE August 2023 - clear lungs, no PE  PFTs:      No data to display         I have personally reviewed the patient's PFTs and spirometry shows no airflow limitation  Labs: Lab Results  Component Value Date   WBC 5.5 08/29/2022   HGB 13.2 08/29/2022   HCT 40 08/29/2022   MCV 88.8 06/19/2022   PLT 233 08/29/2022   Lab Results  Component Value Date   NA 144 08/29/2022   K 3.9 08/29/2022   CL 106 08/29/2022   CO2 23 (A) 08/29/2022  Absolute eosinophil count 298 in 2021.   Immunization status: Immunization History  Administered Date(s) Administered   Influenza,inj,Quad PF,6+ Mos 10/15/2017, 08/21/2019, 08/28/2020, 09/04/2022   Influenza-Unspecified 08/21/2018   Moderna Sars-Covid-2 Vaccination 01/25/2020, 02/22/2020, 11/06/2020   Tdap 10/15/2017   Zoster, Live 07/23/2016    External Records Personally Reviewed: PCP  Assessment:  Asthma, moderate persistent new diagnosis not well controlled Allergic rhinitis GERD, controlled Peripheral eosinophilia  Plan/Recommendations:  Stop Breo. Switch to advair  115 HFA 2 puffs twice a day. Gargle after use. Continue albuterol inhaler as needed. Come see [place] sooner if issues or concerns about your breathing.  Continue PPI Continue zyrtec Consider allergy testing in future with region 2 allergy panel at next visit.    Return to Care: Return in about 2 months (around 12/07/2022). With APP to ensure improvement in symptoms.    Lenice Llamas, MD Pulmonary and [place]

## 2022-10-09 ENCOUNTER — Ambulatory Visit: Payer: 59 | Attending: Physician Assistant | Admitting: Nurse Practitioner

## 2022-10-09 ENCOUNTER — Encounter: Payer: Self-pay | Admitting: Nurse Practitioner

## 2022-10-09 VITALS — BP 118/70 | HR 69 | Ht 63.5 in | Wt 234.2 lb

## 2022-10-09 DIAGNOSIS — R0609 Other forms of dyspnea: Secondary | ICD-10-CM | POA: Diagnosis not present

## 2022-10-09 DIAGNOSIS — E785 Hyperlipidemia, unspecified: Secondary | ICD-10-CM

## 2022-10-09 DIAGNOSIS — G4733 Obstructive sleep apnea (adult) (pediatric): Secondary | ICD-10-CM

## 2022-10-09 DIAGNOSIS — I5032 Chronic diastolic (congestive) heart failure: Secondary | ICD-10-CM

## 2022-10-09 DIAGNOSIS — I2510 Atherosclerotic heart disease of native coronary artery without angina pectoris: Secondary | ICD-10-CM

## 2022-10-09 NOTE — Patient Instructions (Signed)
Medication Instructions:   Your physician recommends that you continue on your current medications as directed. Please refer to the Current Medication list given to you today.   *If you need a refill on your cardiac medications before your next appointment, please call your pharmacy*   Lab Work:  None ordered.  If you have labs (blood work) drawn today and your tests are completely normal, you will receive your results only by: [place] (if you have MyChart) OR A paper copy in the mail If you have any lab test that is abnormal or we need to change your treatment, we will call you to review the results.   Testing/Procedures:  The problem of recurrent insomnia is discussed. Avoidance of caffeine sources is strongly encouraged. Sleep hygiene issues are reviewed. The use of sedative hypnotics for temporary relief is appropriate; we discussed the addictive nature of these drugs, and a one-time only prescription for prn use of a hypnotic is given, to use no more than 3 times per week for 2-3 weeks.    Follow-Up: At [facility], you and your health needs are our priority.  As part of our continuing mission to provide you with exceptional heart care, we have created designated Provider Care Teams.  These Care Teams include your primary Cardiologist (physician) and Advanced Practice Providers (APPs -  Physician Assistants and Nurse Practitioners) who all work together to provide you with the care you need, when you need it.  We recommend signing up for the patient portal called "MyChart".  Sign up information is provided on this After Visit Summary.  MyChart is used to connect with patients for Virtual Visits (Telemedicine).  Patients are able to view lab/test results, encounter notes, upcoming appointments, etc.  Non-urgent messages can be sent to your provider as well.   To learn more about what you can do with MyChart, go to [email or web address].    Your next appointment:    6 month(s)  The format for your next appointment:   In Person  Provider:   Candee Furbish, MD     Other Instructions  Heart Failure Education: Weigh yourself EVERY morning after you go to the bathroom but before you eat or drink anything. Write this number down in a weight log/diary. If you gain 3 pounds overnight or 5 pounds in a week, call the office. Take your medicines as prescribed. If you have concerns about your medications, please call us before you stop taking them.  Eat low salt foods--Limit salt (sodium) to 2000 mg per day. This will help prevent your body from holding onto fluid. Read food labels as many processed foods have a lot of sodium, especially canned goods and prepackaged meats. If you would like some assistance choosing low sodium foods, we would be happy to set you up with a nutritionist. Stay as active as you can everyday. Staying active will give you more energy and make your muscles stronger. Start with 5 minutes at a time and work your way up to 30 minutes a day. Break up your activities--do some in the morning and some in the afternoon. Start with 3 days per week and work your way up to 5 days as you can.  If you have chest pain, feel short of breath, dizzy, or lightheaded, STOP. If you don't feel better after a short rest, call 911. If you do feel better, call the office to let us know you have symptoms with exercise. Limit all fluids for the day to  less than 2 liters. Fluid includes all drinks, coffee, juice, ice chips, soup, jello, and all other liquids.   Important Information About Sugar

## 2022-10-09 NOTE — Telephone Encounter (Signed)
Patient given Itamar Sleep study device. App downloaded on patient's phone. Reviewed instructions. Waiver signed. SB-6. Device registered to [employer]. PIN# provided to patient.

## 2022-11-29 ENCOUNTER — Other Ambulatory Visit: Payer: Self-pay | Admitting: Physician Assistant

## 2022-12-12 ENCOUNTER — Ambulatory Visit: Payer: 59 | Admitting: Adult Health

## 2023-01-02 ENCOUNTER — Ambulatory Visit: Payer: 59 | Admitting: Physician Assistant

## 2023-01-13 ENCOUNTER — Other Ambulatory Visit: Payer: Self-pay | Admitting: Physician Assistant

## 2023-01-16 ENCOUNTER — Ambulatory Visit: Payer: 59 | Admitting: Physician Assistant

## 2023-03-07 ENCOUNTER — Other Ambulatory Visit: Payer: Self-pay | Admitting: Physician Assistant

## 2023-03-07 ENCOUNTER — Other Ambulatory Visit: Payer: Self-pay | Admitting: Cardiology

## 2023-03-07 ENCOUNTER — Other Ambulatory Visit: Payer: Self-pay

## 2023-03-07 MED ORDER — ROSUVASTATIN CALCIUM 20 MG PO TABS: ORAL_TABLET | ORAL | 2 refills | Status: DC

## 2023-03-07 NOTE — Telephone Encounter (Signed)
Pt requesting refills Gabapentin and Amlodipine. Last OV 08/28/2022.

## 2023-03-19 ENCOUNTER — Other Ambulatory Visit: Payer: Self-pay | Admitting: Physician Assistant

## 2023-04-09 ENCOUNTER — Encounter: Payer: Self-pay | Admitting: Cardiology

## 2023-04-09 ENCOUNTER — Ambulatory Visit: Payer: 59 | Attending: Cardiology | Admitting: Cardiology

## 2023-04-09 VITALS — BP 118/74 | HR 98 | Ht 63.5 in | Wt 228.2 lb

## 2023-04-09 DIAGNOSIS — I2510 Atherosclerotic heart disease of native coronary artery without angina pectoris: Secondary | ICD-10-CM | POA: Diagnosis not present

## 2023-04-09 NOTE — Progress Notes (Signed)
Cardiology Office Note:    Date:  04/09/2023   ID:  Tammy Avery, DOB 1961/08/09, MRN [id number]  PCP:  Jarold Motto, PA   [facility] Providers Cardiologist:  Donato Schultz, MD Cardiology APP:  Kennon Rounds     Referring MD: Jarold Motto, [place]    History of Present Illness:    Tammy Avery is a 62 y.o. female here for follow-up of family history you heart disease with mother MI age 78.  Prior stress test in 2016 was normal.  Previously had had some shortness of breath when walking uphill but no significant chest pain.  Amlodipine losartan Lasix.  Has been seen in the past by neurology, [facility].  Has ankylosing spondylitis, followed by rheumatology.  She is on Plaquenil.  Takes occasional Tylenol.  Neck step would be to get a prednisone injection she states.  Her CRP is in the 50s elevated.  She still feels awful, short of breath.  Feels like her legs weigh 100 pounds.  She is having more difficulty.  Past Medical History:  Diagnosis Date   Anemia    C. difficile diarrhea    2015 -- caught from mother   Carpal tunnel syndrome    Chronic migraine    Congenital cataract    left eye, blind in left eye   Connective tissue disease (HCC)    Diverticulitis    distal colon -- confrimed on CT on 01/16/16 and colonoscopy on 08/29/15   H/O hiatal hernia    no meds - no problems   Headache(784.0)    cannot tolerate many NSAIDs, would do injection of depomedrol in office   HLA B27 (HLA B27 positive)    Hyperlipidemia    Hypertensive retinopathy    Hypothyroidism    well controlled   Kidney stones    Memory loss    associated with peudotumor cerebri   Obesity, morbid (HCC)    Osteoarthritis    Polyarthritis, 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HCC)    Prediabetes    Pseudotumor cerebri syndrome    on lasix   Radiculopathy    Seasonal allergies    Seizures (HCC)     on meds - last one 10/18/11- meds increased -- thinks of soma   Sinusitis    Sleep apnea     Does use CPAP   Spondylarthritis    Transient alteration of awareness    Uterine fibroid    Vitamin D deficiency     Past Surgical History:  Procedure Laterality Date   BACK SURGERY  09/28/2007   Degenerative Disc Disease - surgery on  L3/4, L4/5   EYE SURGERY  1975   left eye cataract surgery   IUD REMOVAL  01/09/2012   Procedure: INTRAUTERINE DEVICE (IUD) REMOVAL;  Surgeon: Serita Kyle, MD;  Location: [facility];  Service: Gynecology;  Laterality: N/A;   OVARIAN CYST REMOVAL  01/09/2012   Procedure: OVARIAN CYSTECTOMY;  Surgeon: Serita Kyle, MD;  Location: [facility];  Service: Gynecology;  Laterality: Right;   right knee arthroscopic  09/24/2006   TONSILLECTOMY     VESICOVAGINAL FISTULA CLOSURE W/ TAH     for fibroids    Current Medications: No outpatient medications have been marked as taking for the 04/09/23 encounter (Office Visit) with Jake Bathe, MD.     Allergies:   Shellfish allergy, Advil [ibuprofen], Aleve [naproxen sodium], Aspirin, Benadryl [diphenhydramine], Doxycycline, Lactose intolerance (gi), Limbrel [flavocoxid], Mobic [meloxicam], Neosporin [neomycin-bacitracin zn-polymyx], Soma [carisoprodol], Codeine, Latex,  Morphine and codeine, Penicillins, and Prednisone   Social History   Socioeconomic History   Marital status: Single    Spouse name: Not on file   Number of children: Not on file   Years of education: Not on file   Highest education level: Not on file  Occupational History   Not on file  Tobacco Use   Smoking status: Never   Smokeless tobacco: Never  Substance and Sexual Activity   Alcohol use: Yes    Alcohol/week: 1.0 standard drink of alcohol    Types: 1 Glasses of wine per week   Drug use: No   Sexual activity: Never    Birth control/protection: None  Other Topics Concern   Not on file  Social History Narrative   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[place]   Single   No children   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 There is no history of Colon cancer or Asthma.  ROS:   Please see the history of present illness.     All other systems reviewed and are negative.  EKGs/Labs/Other Studies Reviewed:    The following studies were reviewed today: Cardiac Studies & Procedures     STRESS TESTS  MYOCARDIAL PERFUSION IMAGING 08/17/2015  Narrative  The left ventricular ejection fraction is normal (55-65%).  Nuclear stress EF: 56%.  There was no ST segment deviation noted during stress.  The study is normal.  This is a low risk study.  No evidence of ischemia or scar.   ECHOCARDIOGRAM  ECHOCARDIOGRAM COMPLETE 07/04/2022  Narrative ECHOCARDIOGRAM REPORT    Patient Name:   Tammy Avery Date of Exam: 07/04/2022 Medical Rec #:  [id number]      Height:       63.5 in Accession #:    [id number]     Weight:       210.0 lb Date of Birth:  [birth year]      BSA:          1.986 m Patient Age:    60 years       BP:           132/78 mmHg Patient Gender: F              HR:           72 bpm. Exam Location:  [address]  Procedure: 2D Echo, 3D Echo, Cardiac Doppler and Color Doppler  Indications:    R06.00 Dyspnea  History:        Patient has no prior history of Echocardiogram examinations. CAD, Signs/Symptoms:Dyspnea, Chest Pain and Shortness of Breath; Risk Factors:Sleep Apnea, Family History of Coronary Artery Disease, Hypertension and Dyslipidemia.  Obesity.  Sonographer:    Farrel Conners RDCS Referring Phys: Lanora Manis PECK  IMPRESSIONS   1. Left ventricular ejection fraction by 3D volume is 67 %. The left ventricle has normal function. The left ventricle has no regional wall motion abnormalities. There is mild  concentric left ventricular hypertrophy. Left ventricular diastolic parameters are consistent with Grade I diastolic dysfunction (impaired relaxation). 2. Right ventricular systolic function is normal. The right ventricular size is normal. Tricuspid regurgitation signal is inadequate for assessing PA pressure. 3. Left atrial size was severely dilated. 4. The mitral valve is normal in structure. No evidence of mitral valve regurgitation. No evidence of mitral stenosis. 5. The aortic valve is normal in structure. Aortic valve regurgitation is not visualized. No aortic stenosis is present. 6. There is borderline dilatation of the ascending aorta, measuring 37 mm. 7. The inferior vena cava is normal in size with greater than 50% respiratory variability, suggesting right atrial pressure of 3 mmHg.  Comparison(s): No prior Echocardiogram.  FINDINGS Left Ventricle: Left ventricular ejection fraction by 3D volume is 67 %. The left ventricle has normal function. The left ventricle has no regional wall motion abnormalities. The left ventricular internal cavity size was normal in size. There is mild concentric left ventricular hypertrophy. Left ventricular diastolic parameters are consistent with Grade I diastolic dysfunction (impaired relaxation).  Right Ventricle: The right ventricular size is normal. No increase in right ventricular wall thickness. Right ventricular systolic function is normal. Tricuspid regurgitation signal is inadequate for assessing PA pressure.  Left Atrium: Left atrial size was severely dilated.  Right Atrium: Right atrial size was normal in size.  Pericardium: There is no evidence of pericardial  effusion.  Mitral Valve: The mitral valve is normal in structure. No evidence of mitral valve regurgitation. No evidence of mitral valve stenosis.  Tricuspid Valve: The tricuspid valve is normal in structure. Tricuspid valve regurgitation is not demonstrated. No evidence of tricuspid stenosis.  Aortic Valve: The aortic valve is normal in structure. Aortic valve regurgitation is not visualized. No aortic stenosis is present.  Pulmonic Valve: The pulmonic valve was not well visualized. Pulmonic valve regurgitation is trivial. No evidence of pulmonic stenosis.  Aorta: There is borderline dilatation of the ascending aorta, measuring 37 mm.  Venous: The inferior vena cava is normal in size with greater than 50% respiratory variability, suggesting right atrial pressure of 3 mmHg.  IAS/Shunts: No atrial level shunt detected by color flow Doppler.   LEFT VENTRICLE PLAX 2D LVIDd:         4.40 cm         Diastology LVIDs:         2.80 cm         LV e' medial:    7.83 cm/s LV PW:         1.10 cm         LV E/e' medial:  9.6 LV IVS:        1.00 cm         LV e' lateral:   9.57 cm/s LVOT diam:     2.50 cm         LV E/e' lateral: 7.9 LV SV:         94 LV SV Index:   47 LVOT Area:     4.91 cm        3D Volume EF LV 3D EF:    Left ventricul ar ejection fraction by 3D volume is 67 %.  3D Volume EF: 3D EF:        67 % LV EDV:       153 ml LV ESV:       50 ml LV SV:        103 ml  RIGHT VENTRICLE RV Basal  diam:  3.30 cm RV S prime:     16.80 cm/s TAPSE (M-mode): 3.1 cm  LEFT ATRIUM             Index        RIGHT ATRIUM           Index LA diam:        4.70 cm 2.37 cm/m   RA Area:     15.90 cm LA Vol (A2C):   85.0 ml 42.80 ml/m  RA Volume:   39.30 ml  19.79 ml/m LA Vol (A4C):   89.2 ml 44.92 ml/m LA Biplane Vol: 97.1 ml 48.90 ml/m AORTIC VALVE LVOT Vmax:   88.05 cm/s LVOT Vmean:  57.650 cm/s LVOT VTI:    0.191 m  AORTA Ao Root diam: 3.45 cm Ao Asc diam:  3.65  cm  MITRAL VALVE MV Area (PHT): cm         SHUNTS MV Decel Time: 198 msec    Systemic VTI:  0.19 m MV E velocity: 75.47 cm/s  Systemic Diam: 2.50 cm MV A velocity: 86.48 cm/s MV E/A ratio:  0.87  Kardie Tobb DO Electronically signed by Thomasene Ripple DO Signature Date/Time: 07/04/2022/2:52:49 PM    Final     CT SCANS  CT CARDIAC SCORING (SELF PAY ONLY) 12/20/2021  Addendum 12/20/2021  3:10 PM ADDENDUM REPORT: 12/20/2021 15:08  EXAM: OVER-READ INTERPRETATION  CT CHEST  The following report is an over-read performed by radiologist Dr. Nadara Eaton [facility] on 12/20/2021. This over-read does not include interpretation of cardiac or coronary anatomy or pathology. The coronary calcium score interpretation by the cardiologist is attached.  COMPARISON:  CT abdomen 12/11/2020  FINDINGS: Visualized mediastinal structures are normal. Images of the upper abdomen are unremarkable. Focal thickening along the right minor fissure on sequence 4, image 20 measures roughly 2 mm and likely an incidental finding. No significant airspace disease or consolidation in the visualized lungs. No acute bone abnormality.  IMPRESSION: No acute extracardiac findings.   Electronically Signed By: Richarda Overlie [name]. On: 12/20/2021 15:08  Narrative CLINICAL DATA:  Cardiovascular Disease Risk stratification  EXAM: Coronary Calcium Score  TECHNIQUE: A gated, non-contrast computed tomography scan of the heart was performed using 3mm slice thickness. Axial images were analyzed on a dedicated workstation. Calcium scoring of the coronary arteries was performed using the Agatston method.  FINDINGS: Coronary Calcium Score:  Left main: 0  Left anterior descending artery: 172  Left circumflex artery: 7.43  Right coronary artery: 3.87  Total: 183  Percentile: 95  Pericardium: Normal.  Ascending Aorta: Normal caliber; aortic atherosclerosis.  Non-cardiac: See separate report from  [facility].  IMPRESSION: Coronary calcium score of 183. This was 95 percentile for age-, race-, and sex-matched controls.  Aortic atherosclerosis.  RECOMMENDATIONS: Coronary artery calcium (CAC) score is a strong predictor of incident coronary heart disease (CHD) and provides predictive information beyond traditional risk factors. CAC scoring is reasonable to use in the decision to withhold, postpone, or initiate statin therapy in intermediate-risk or selected borderline-risk asymptomatic adults (age 13-75 years and LDL-C >=70 to <190 mg/dL) who do not have diabetes or established atherosclerotic cardiovascular disease (ASCVD).* In intermediate-risk (10-year ASCVD risk >=7.5% to <20%) adults or selected borderline-risk (10-year ASCVD risk >=5% to <7.5%) adults in whom a CAC score is measured for the purpose of making a treatment decision the following recommendations have been made:  If CAC=0, it is reasonable to withhold statin therapy and reassess in 5  to 10 years, as long as higher risk conditions are absent (diabetes mellitus, family history of premature CHD in first degree relatives (males <55 years; females <65 years), cigarette smoking, or LDL >=190 mg/dL).  If CAC is 1 to 99, it is reasonable to initiate statin therapy for patients >=54 years of age.  If CAC is >=100 or >=75th percentile, it is reasonable to initiate statin therapy at any age.  Cardiology referral should be considered for patients with CAC scores >=400 or >=75th percentile.  *2018 AHA/ACC/AACVPR/AAPA/ABC/ACPM/ADA/AGS/APhA/ASPC/NLA/PCNA Guideline on the Management of Blood Cholesterol: A Report of the American College of Cardiology/American Heart Association Task Force on Clinical Practice Guidelines. J Am Coll Cardiol. 2019;73(24):3168-3209.  Olga Millers, MD  Electronically Signed: By: Olga Millers [name]. On: 12/20/2021 13:14           EKG:  no new  Recent Labs: 06/19/2022:  TSH 2.84 08/29/2022: ALT 36; BUN 20; Creatinine 1.2; Hemoglobin 13.2; Platelets 233; Potassium 3.9; Sodium 144  Recent Lipid Panel    Component Value Date/Time   CHOL 161 02/25/2022 1015   TRIG 49 02/25/2022 1015   HDL 91 02/25/2022 1015   CHOLHDL 2.1 08/28/2020 0940   VLDL 16.0 04/26/2019 0836   LDLCALC 59 02/25/2022 1015   LDLCALC 84 08/28/2020 0940     Risk Assessment/Calculations:          STOP-Bang Score:  6       Physical Exam:    VS:  BP 118/74   Pulse 98   Ht 5' 3.5" (1.613 m)   Wt 228 lb 3.2 oz (103.5 kg)   LMP 12/13/2011   SpO2 98%   BMI 39.79 kg/m     Wt Readings from Last 3 Encounters:  04/09/23 228 lb 3.2 oz (103.5 kg)  10/09/22 234 lb 3.2 oz (106.2 kg)  10/07/22 239 lb 3.2 oz (108.5 kg)     GEN:  Well nourished, well developed in no acute distress HEENT: Normal NECK: No JVD; No carotid bruits LYMPHATICS: No lymphadenopathy CARDIAC: RRR, no murmurs, rubs, gallops RESPIRATORY:  Clear to auscultation without rales, wheezing or rhonchi  ABDOMEN: Soft, non-tender, non-distended MUSCULOSKELETAL:  No edema; No deformity  SKIN: Warm and dry NEUROLOGIC:  Alert and oriented x 3 PSYCHIATRIC:  Normal affect   ASSESSMENT:    1. Coronary artery disease involving native coronary artery of native heart without angina pectoris   2. Morbid obesity (HCC)    PLAN:    In order of problems listed above:  Coronary artery disease/coronary artery calcification - Calcium score 170s back in 2023.  Currently on rosuvastatin 20 mg.  Previously on pravastatin.  Most recent LDL 59.  At goal.  Excellent. Feels sluggish on Crestor.  She would like to come off of this medication.  I will have her talk to the pharmacy team to discuss further.  Stop medication for 2 weeks.  See how she feels.  Unfortunately she is having ongoing increasing shortness of breath, sluggishness, could be anginal equivalent with her known coronary artery disease.  I would like to proceed with left  and right heart catheterization.  Ejection fraction was normal.  Grade 1 diastolic dysfunction noted.  Ankylosis spondylitis --CRP very high. Plaquinl.   Essential hypertension Blood pressure doing well.  Continue with current medication strategy.  Amlodipine 10 mg a day carvedilol 25 mg twice a day Lasix 60 mg a day losartan 100 mg a day.  No changes in medical management.   Left anterior fascicular block  No higher symptoms such as syncope.  Doing well.   Morbid obesity Continue to encourage weight loss.  Try carbohydrate reduction.  Mediterranean diet.  Obstructive sleep apnea Neurology has been following.  In the past has had mask wearing off and on.   Family history of early CAD Mother and father both had massive heart attacks.  Grandmother had stroke.   Chronic pain of left knee She has been holding off on surgery for this.  She will need her FMLA paperwork filled out by her primary care team.         Medication Adjustments/Labs and Tests Ordered: Current medicines are reviewed at length with the patient today.  Concerns regarding medicines are outlined above.  Orders Placed This Encounter  Procedures   AMB Referral to [facility]   No orders of the defined types were placed in this encounter.   Patient Instructions  Medication Instructions:  Your physician recommends that you continue on your current medications as directed. Please refer to the Current Medication list given to you today.  *If you need a refill on your cardiac medications before your next appointment, please call your pharmacy*    Testing/Procedures: Heart Cath To be scheduled at next office visit. Your physician has requested that you have a cardiac catheterization. Cardiac catheterization is used to diagnose and/or treat various heart conditions. Doctors may recommend this procedure for a number of different reasons. The most common reason is to evaluate chest pain. Chest pain can be a  symptom of coronary artery disease (CAD), and cardiac catheterization can show whether plaque is narrowing or blocking your heart's arteries. This procedure is also used to evaluate the valves, as well as measure the blood flow and oxygen levels in different parts of your heart. For further information please visit [email or web address] Please follow instruction sheet, as given.    Follow-Up: At [facility], you and your health needs are our priority.  As part of our continuing mission to provide you with exceptional heart care, we have created designated Provider Care Teams.  These Care Teams include your primary Cardiologist (physician) and Advanced Practice Providers (APPs -  Physician Assistants and Nurse Practitioners) who all work together to provide you with the care you need, when you need it.   Your next appointment:   1 month(s) to set up cath for end of June.   Provider:   Any APP     Signed, Donato Schultz, MD  04/09/2023 2:56 PM    [facility]

## 2023-04-09 NOTE — Patient Instructions (Signed)
Medication Instructions:  Your physician recommends that you continue on your current medications as directed. Please refer to the Current Medication list given to you today.  *If you need a refill on your cardiac medications before your next appointment, please call your pharmacy*    Testing/Procedures: Heart Cath To be scheduled at next office visit. Your physician has requested that you have a cardiac catheterization. Cardiac catheterization is used to diagnose and/or treat various heart conditions. Doctors may recommend this procedure for a number of different reasons. The most common reason is to evaluate chest pain. Chest pain can be a symptom of coronary artery disease (CAD), and cardiac catheterization can show whether plaque is narrowing or blocking your heart's arteries. This procedure is also used to evaluate the valves, as well as measure the blood flow and oxygen levels in different parts of your heart. For further information please visit [email or web address] Please follow instruction sheet, as given.    Follow-Up: At [facility], you and your health needs are our priority.  As part of our continuing mission to provide you with exceptional heart care, we have created designated Provider Care Teams.  These Care Teams include your primary Cardiologist (physician) and Advanced Practice Providers (APPs -  Physician Assistants and Nurse Practitioners) who all work together to provide you with the care you need, when you need it.   Your next appointment:   1 month(s) to set up cath for end of June.   Provider:   Any APP

## 2023-05-01 ENCOUNTER — Ambulatory Visit: Payer: 59

## 2023-05-01 NOTE — Progress Notes (Deleted)
Patient ID: DICIE PIPPERT                 DOB: [birth year]                    MRN: [id number]      HPI: Tammy Avery is a 62 y.o. female patient referred to lipid clinic by Dr.Skains. PMH is significant for HTN, OSA, spondylarthritis, seizures, morbid obesity, prediabetes, HDL, family hx of early CAD (mother MI age 57), CAC score 170 (2023). Patient saw Dr.Skains on 04/09/2023. Reported she feels sluggish on Crestor.  She would like to come off of this medication.   Today patient presented for lipid clinic    Reviewed options for lowering LDL cholesterol, including ezetimibe, PCSK-9 inhibitors, bempedoic acid and inclisiran.  Discussed mechanisms of action, dosing, side effects and potential decreases in LDL cholesterol.  Also reviewed cost information and potential options for patient assistance.  Current Medications: Crestor 20 mg daily  Intolerances: Crestor -legs cramps , pravastatin  Risk Factors: HTN, HDL, family hx of early CAD (mother MI age 58), CAC score 170, OSA, spondylarthritis, LDL goal: <55  LDLc 59, TC 161, TG 49- 02/25/2022  Diet:   Exercise:   Family History:   Social History:   Labs: Lipid Panel     Component Value Date/Time   CHOL 161 02/25/2022 1015   TRIG 49 02/25/2022 1015   HDL 91 02/25/2022 1015   CHOLHDL 2.1 08/28/2020 0940   VLDL 16.0 04/26/2019 0836   LDLCALC 59 02/25/2022 1015   LDLCALC 84 08/28/2020 0940   LABVLDL 11 02/25/2022 1015    Past Medical History:  Diagnosis Date   Anemia    C. difficile diarrhea    2015 -- caught from mother   Carpal tunnel syndrome    Chronic migraine    Congenital cataract    left eye, blind in left eye   Connective tissue disease (HCC)    Diverticulitis    distal colon -- confrimed on CT on 01/16/16 and colonoscopy on 08/29/15   H/O hiatal hernia    no meds - no problems   Headache(784.0)    cannot tolerate many NSAIDs, would do injection of depomedrol in office   HLA B27 (HLA B27 positive)     Hyperlipidemia    Hypertensive retinopathy    Hypothyroidism    well controlled   Kidney stones    Memory loss    associated with peudotumor cerebri   Obesity, morbid (HCC)    Osteoarthritis    Polyarthritis, 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HCC)    Prediabetes    Pseudotumor cerebri syndrome    on lasix   Radiculopathy    Seasonal allergies    Seizures (HCC)     on meds - last one 10/18/11- meds increased -- thinks of soma   Sinusitis    Sleep apnea    Does use CPAP   Spondylarthritis    Transient alteration of awareness    Uterine fibroid    Vitamin D deficiency     Current Outpatient Medications on File Prior to Visit  Medication Sig Dispense Refill   acetaminophen (TYLENOL) 500 MG tablet Take 500 mg by mouth every 6 (six) hours as needed. Patient used this medication for pain.     albuterol (VENTOLIN HFA) 108 (90 Base) MCG/ACT inhaler Inhale 2 puffs into the lungs every 6 (six) hours as needed for wheezing or shortness of breath. 8 g 0   amLODipine (NORVASC) 10 MG tablet  TAKE 1 TABLET(10 MG) BY MOUTH DAILY 90 tablet 1   Biotin 2.5 MG CAPS Take 1 capsule by mouth daily.     Calcium 500-2.5 MG-MCG CHEW Chew 500 mg by mouth daily.     carvedilol (COREG) 25 MG tablet TAKE 1 TABLET(25 MG) BY MOUTH TWICE DAILY WITH A MEAL 180 tablet 1   cetirizine (ZYRTEC) 10 MG tablet Take 1 tablet (10 mg total) by mouth daily. 30 tablet 11   Cholecalciferol (VITAMIN D-3) 5000 units TABS Takes one tablet daily. 30 tablet    Clobetasol & Clobetasol Emul 0.05 & 0.05 % MISC Apply 1 application topically 2 (two) times daily as needed.      CRANBERRY PO Take 2 capsules by mouth daily in the afternoon. 4200 mg     dextromethorphan (DELSYM) 30 MG/5ML liquid Take by mouth as needed for cough.     diclofenac sodium (VOLTAREN) 1 % GEL Apply 2 g topically 4 (four) times daily.     furosemide (LASIX) 40 MG tablet TAKE 1 AND 1/2 TABLETS(60 MG) BY MOUTH DAILY 90 tablet 1   gabapentin (NEURONTIN) 300 MG capsule TAKE 1  CAPSULE(300 MG) BY MOUTH AT BEDTIME 90 capsule 1   hydroxychloroquine (PLAQUENIL) 200 MG tablet Take 400 mg by mouth daily.     levothyroxine (SYNTHROID) 75 MCG tablet TAKE 1 TABLET(75 MCG) BY MOUTH DAILY BEFORE BREAKFAST 90 tablet 0   losartan (COZAAR) 100 MG tablet TAKE 1 TABLET(100 MG) BY MOUTH DAILY 90 tablet 3   Multiple Vitamin (MULTIVITAMIN) tablet Take 2 tablets by mouth daily.     potassium chloride (KLOR-CON M) 10 MEQ tablet TAKE 1 TABLET(10 MEQ) BY MOUTH TWICE DAILY 180 tablet 1   Pyridoxine HCl (VITAMIN B6) 200 MG TABS Take one tablet daily. 30 tablet    rosuvastatin (CRESTOR) 20 MG tablet TAKE 1 TABLET(20 MG) BY MOUTH DAILY 90 tablet 2   zonisamide (ZONEGRAN) 100 MG capsule Take 400 mg by mouth daily.      No current facility-administered medications on file prior to visit.    Allergies  Allergen Reactions   Shellfish Allergy Itching   Advil [Ibuprofen]     Rapid heartbeat   Aleve [Naproxen Sodium]     dizziness Nausea -- only causes if this takes regularly   Aspirin Tinitus   Benadryl [Diphenhydramine] Hives and Other (See Comments)    fatigue   Doxycycline Other (See Comments)    Kidney stones   Lactose Intolerance (Gi) Diarrhea   Limbrel [Flavocoxid]     Diarrhea     Mobic [Meloxicam]     vomiting    Neosporin [Neomycin-Bacitracin Zn-Polymyx]     Causes infection   Soma [Carisoprodol] Other (See Comments)    seizures   Codeine Rash    Patient states that Codeine in liquid form causes her rash reaction.  But other forms of codeine are okay for patient to take.   Latex Rash   Morphine And Codeine Itching and Rash   Penicillins Rash   Prednisone Palpitations    Weight gain    Assessment/Plan:  1. Hyperlipidemia -  No problems updated. No problem-specific Assessment & Plan notes found for this encounter.    Thank you,  Carmela Hurt, Pharm.D [facility] A Division of [facility] [address] [place], [place]  [address]  Phone: [phone]; Fax: [phone]

## 2023-05-16 ENCOUNTER — Ambulatory Visit: Payer: 59 | Admitting: Nurse Practitioner

## 2023-05-20 ENCOUNTER — Other Ambulatory Visit: Payer: Self-pay | Admitting: Physician Assistant

## 2023-05-29 ENCOUNTER — Ambulatory Visit: Payer: 59

## 2023-06-01 ENCOUNTER — Other Ambulatory Visit: Payer: Self-pay | Admitting: Physician Assistant

## 2023-06-02 NOTE — Telephone Encounter (Signed)
Pt requesting refill for Gabapentin 300 mg. Last OV 08/2022.

## 2023-06-18 ENCOUNTER — Ambulatory Visit: Payer: 59

## 2023-07-18 ENCOUNTER — Other Ambulatory Visit: Payer: Self-pay | Admitting: Physician Assistant

## 2023-07-25 ENCOUNTER — Other Ambulatory Visit: Payer: Self-pay | Admitting: Physician Assistant

## 2023-07-25 DIAGNOSIS — Z1231 Encounter for screening mammogram for malignant neoplasm of breast: Secondary | ICD-10-CM

## 2023-07-29 NOTE — Progress Notes (Incomplete)
Tammy Avery is a 62 y.o. female here for a follow up of a pre-existing problem.  History of Present Illness:   No chief complaint on file.   HPI ***: Complains of *** that began *** ago  ***  ***  ***   Past Medical History:  Diagnosis Date   Anemia    C. difficile diarrhea    2015 -- caught from mother   Carpal tunnel syndrome    Chronic migraine    Congenital cataract    left eye, blind in left eye   Connective tissue disease (HCC)    Diverticulitis    distal colon -- confrimed on CT on 01/16/16 and colonoscopy on 08/29/15   H/O hiatal hernia    no meds - no problems   Headache(784.0)    cannot tolerate many NSAIDs, would do injection of depomedrol in office   HLA B27 (HLA B27 positive)    Hyperlipidemia    Hypertensive retinopathy    Hypothyroidism    well controlled   Kidney stones    Memory loss    associated with peudotumor cerebri   Obesity, morbid (HCC)    Osteoarthritis    Polyarthritis, 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HCC)    Prediabetes    Pseudotumor cerebri syndrome    on lasix   Radiculopathy    Seasonal allergies    Seizures (HCC)     on meds - last one 10/18/11- meds increased -- thinks of soma   Sinusitis    Sleep apnea    Does use CPAP   Spondylarthritis    Transient alteration of awareness    Uterine fibroid    Vitamin D deficiency      Social History   Tobacco Use   Smoking status: Never   Smokeless tobacco: Never  Substance Use Topics   Alcohol use: Yes    Alcohol/week: 1.0 standard drink of alcohol    Types: 1 Glasses of wine per week   Drug use: No    Past Surgical History:  Procedure Laterality Date   BACK SURGERY  09/28/2007   Degenerative Disc Disease - surgery on  L3/4, L4/5   EYE SURGERY  1975   left eye cataract surgery   IUD REMOVAL  01/09/2012   Procedure: INTRAUTERINE DEVICE (IUD) REMOVAL;  Surgeon: Serita Kyle, MD;  Location: [facility];  Service: Gynecology;  Laterality: N/A;   OVARIAN CYST REMOVAL  01/09/2012    Procedure: OVARIAN CYSTECTOMY;  Surgeon: Serita Kyle, MD;  Location: [facility];  Service: Gynecology;  Laterality: Right;   right knee arthroscopic  09/24/2006   TONSILLECTOMY     VESICOVAGINAL FISTULA CLOSURE W/ TAH     for fibroids    Family History  Problem Relation Age of Onset   Hypertension Mother    Heart disease Mother    Diabetes Mother    Lupus Mother    Dementia Mother    CAD Father    Heart disease Father    Hypertension Sister    Breast cancer Sister    Hypertension Brother    Diabetes Brother    Heart disease Maternal Grandmother    CVA Maternal Grandmother    Colon cancer Neg Hx    Asthma Neg Hx     Allergies  Allergen Reactions   Shellfish Allergy Itching   Advil [Ibuprofen]     Rapid heartbeat   Aleve [Naproxen Sodium]     dizziness Nausea -- only causes if this takes regularly   Aspirin Tinitus  Benadryl [Diphenhydramine] Hives and Other (See Comments)    fatigue   Doxycycline Other (See Comments)    Kidney stones   Lactose Intolerance (Gi) Diarrhea   Limbrel [Flavocoxid]     Diarrhea     Mobic [Meloxicam]     vomiting    Neosporin [Neomycin-Bacitracin Zn-Polymyx]     Causes infection   Soma [Carisoprodol] Other (See Comments)    seizures   Codeine Rash    Patient states that Codeine in liquid form causes her rash reaction.  But other forms of codeine are okay for patient to take.   Latex Rash   Morphine And Codeine Itching and Rash   Penicillins Rash   Prednisone Palpitations    Weight gain    Current Medications:   Current Outpatient Medications:    acetaminophen (TYLENOL) 500 MG tablet, Take 500 mg by mouth every 6 (six) hours as needed. Patient used this medication for pain., Disp: , Rfl:    albuterol (VENTOLIN HFA) 108 (90 Base) MCG/ACT inhaler, Inhale 2 puffs into the lungs every 6 (six) hours as needed for wheezing or shortness of breath., Disp: 8 g, Rfl: 0   amLODipine (NORVASC) 10 MG tablet, TAKE 1 TABLET(10 MG) BY  MOUTH DAILY, Disp: 90 tablet, Rfl: 1   Biotin 2.5 MG CAPS, Take 1 capsule by mouth daily., Disp: , Rfl:    Calcium 500-2.5 MG-MCG CHEW, Chew 500 mg by mouth daily., Disp: , Rfl:    carvedilol (COREG) 25 MG tablet, TAKE 1 TABLET(25 MG) BY MOUTH TWICE DAILY WITH A MEAL, Disp: 180 tablet, Rfl: 1   cetirizine (ZYRTEC) 10 MG tablet, Take 1 tablet (10 mg total) by mouth daily., Disp: 30 tablet, Rfl: 11   Cholecalciferol (VITAMIN D-3) 5000 units TABS, Takes one tablet daily., Disp: 30 tablet, Rfl:    Clobetasol & Clobetasol Emul 0.05 & 0.05 % MISC, Apply 1 application topically 2 (two) times daily as needed. , Disp: , Rfl:    CRANBERRY PO, Take 2 capsules by mouth daily in the afternoon. 4200 mg, Disp: , Rfl:    dextromethorphan (DELSYM) 30 MG/5ML liquid, Take by mouth as needed for cough., Disp: , Rfl:    diclofenac sodium (VOLTAREN) 1 % GEL, Apply 2 g topically 4 (four) times daily., Disp: , Rfl:    furosemide (LASIX) 40 MG tablet, TAKE 1 AND 1/2 TABLETS(60 MG) BY MOUTH DAILY, Disp: 90 tablet, Rfl: 0   gabapentin (NEURONTIN) 300 MG capsule, TAKE 1 CAPSULE(300 MG) BY MOUTH AT BEDTIME, Disp: 30 capsule, Rfl: 0   hydroxychloroquine (PLAQUENIL) 200 MG tablet, Take 400 mg by mouth daily., Disp: , Rfl:    levothyroxine (SYNTHROID) 75 MCG tablet, TAKE 1 TABLET(75 MCG) BY MOUTH DAILY BEFORE BREAKFAST, Disp: 90 tablet, Rfl: 0   losartan (COZAAR) 100 MG tablet, TAKE 1 TABLET(100 MG) BY MOUTH DAILY, Disp: 90 tablet, Rfl: 3   Multiple Vitamin (MULTIVITAMIN) tablet, Take 2 tablets by mouth daily., Disp: , Rfl:    potassium chloride (KLOR-CON M) 10 MEQ tablet, TAKE 1 TABLET(10 MEQ) BY MOUTH TWICE DAILY, Disp: 180 tablet, Rfl: 1   Pyridoxine HCl (VITAMIN B6) 200 MG TABS, Take one tablet daily., Disp: 30 tablet, Rfl:    rosuvastatin (CRESTOR) 20 MG tablet, TAKE 1 TABLET(20 MG) BY MOUTH DAILY, Disp: 90 tablet, Rfl: 2   zonisamide (ZONEGRAN) 100 MG capsule, Take 400 mg by mouth daily. , Disp: , Rfl:    Review of  Systems:   ROS  Vitals:   There were  no vitals filed for this visit.   There is no height or weight on file to calculate BMI.  Physical Exam:   Physical Exam  Assessment and Plan:   There are no diagnoses linked to this encounter.        I,Emily Lagle,acting as a Neurosurgeon for [employer], PA.,have documented all relevant documentation on the behalf of Jarold Motto, PA,as directed by  Jarold Motto, PA while in the presence of Jarold Motto, [place].  *** I, Larey Brick, have reviewed all documentation for this visit. The documentation on 07/29/23 for the exam, diagnosis, procedures, and orders are all accurate and complete.  Jarold Motto, PA-C

## 2023-07-30 ENCOUNTER — Ambulatory Visit
Admission: RE | Admit: 2023-07-30 | Discharge: 2023-07-30 | Disposition: A | Discharge status: Home / Self Care | Payer: 59 | Source: Ambulatory Visit | Attending: Physician Assistant

## 2023-07-30 DIAGNOSIS — Z1231 Encounter for screening mammogram for malignant neoplasm of breast: Secondary | ICD-10-CM

## 2023-07-31 ENCOUNTER — Ambulatory Visit: Payer: 59 | Admitting: Physician Assistant

## 2023-08-04 ENCOUNTER — Encounter: Payer: Self-pay | Admitting: Physician Assistant

## 2023-08-04 ENCOUNTER — Ambulatory Visit (INDEPENDENT_AMBULATORY_CARE_PROVIDER_SITE_OTHER): Payer: 59 | Admitting: Physician Assistant

## 2023-08-04 VITALS — BP 136/80 | HR 91 | Temp 98.0°F | Ht 63.5 in | Wt 230.0 lb

## 2023-08-04 DIAGNOSIS — L243 Irritant contact dermatitis due to cosmetics: Secondary | ICD-10-CM

## 2023-08-04 DIAGNOSIS — E7800 Pure hypercholesterolemia, unspecified: Secondary | ICD-10-CM | POA: Diagnosis not present

## 2023-08-04 DIAGNOSIS — Z23 Encounter for immunization: Secondary | ICD-10-CM | POA: Diagnosis not present

## 2023-08-04 DIAGNOSIS — I10 Essential (primary) hypertension: Secondary | ICD-10-CM | POA: Diagnosis not present

## 2023-08-04 DIAGNOSIS — E039 Hypothyroidism, unspecified: Secondary | ICD-10-CM | POA: Diagnosis not present

## 2023-08-04 LAB — LIPID PANEL
Cholesterol: 245 mg/dL — ABNORMAL HIGH (ref 0–200)
HDL: 85.4 mg/dL (ref >39.00)
LDL Cholesterol: 138 mg/dL — ABNORMAL HIGH (ref 0–99)
NonHDL: 159.55
Total CHOL/HDL Ratio: 3
Triglycerides: 109 mg/dL (ref 0.0–149.0)
VLDL: 21.8 mg/dL (ref 0.0–40.0)

## 2023-08-04 LAB — TSH: TSH: 3.59 u[IU]/mL (ref 0.35–5.50)

## 2023-08-04 MED ORDER — GABAPENTIN 300 MG PO CAPS: ORAL_CAPSULE | ORAL | 1 refills | Status: DC

## 2023-08-04 MED ORDER — FUROSEMIDE 40 MG PO TABS: 60.0000 mg | ORAL_TABLET | Freq: Every day | ORAL | 1 refills | Status: DC

## 2023-08-04 MED ORDER — LOSARTAN POTASSIUM 100 MG PO TABS: 100.0000 mg | ORAL_TABLET | Freq: Every day | ORAL | 3 refills | Status: DC

## 2023-08-04 MED ORDER — LEVOTHYROXINE SODIUM 75 MCG PO TABS: 75.0000 ug | ORAL_TABLET | Freq: Every day | ORAL | 1 refills | Status: DC

## 2023-08-04 MED ORDER — CLOBETASOL PROPIONATE 0.05 % EX CREA: 1.0000 | TOPICAL_CREAM | Freq: Two times a day (BID) | CUTANEOUS | 2 refills | Status: AC

## 2023-08-04 MED ORDER — POTASSIUM CHLORIDE CRYS ER 10 MEQ PO TBCR: 10.0000 meq | EXTENDED_RELEASE_TABLET | Freq: Once | ORAL | 1 refills | Status: DC

## 2023-08-04 MED ORDER — CARVEDILOL 25 MG PO TABS: 25.0000 mg | ORAL_TABLET | Freq: Two times a day (BID) | ORAL | 1 refills | Status: DC

## 2023-08-04 NOTE — Patient Instructions (Signed)
It was great to see you!  Enjoy upcoming retirement  Refills have been sent  Let's follow-up in 6 months, sooner if you have concerns.  Take care,  Jarold Motto PA-C

## 2023-08-04 NOTE — Progress Notes (Signed)
Tammy Avery is a 62 y.o. female here for a follow up of a pre-existing problem.  History of Present Illness:   Chief Complaint  Patient presents with   Hypertension    HPI  Hypertension: Managed/Compliant with 100 mg Losartan, 25 mg Carvedilol BID, 10 mg Amlodipine,  and 60 mg Lasix.  Reports fluid restriction of 64 oz daily, and plans to start going to the gym with her sister. Denies chest pain, shortness of breath, blurred vision, dizziness, unusual headaches, or lower leg swelling.  BP Readings from Last 3 Encounters:  08/04/23 136/80  04/09/23 118/74  10/09/22 118/70   Hyperlipidemia: Temporarily halted 20 mg Rosuvastatin as instructed by Donato Schultz, MD.  Lab Results  Component Value Date   CHOL 161 02/25/2022   HDL 91 02/25/2022   LDLCALC 59 02/25/2022   TRIG 49 02/25/2022   CHOLHDL 2.1 08/28/2020   Rash (Arm): Reports that she recently had a rash on her arm after opening an overly perfumed package. Requested Clobetasol as that has previously worked.  Hypothyroidism Currently taking levothyroxine 75 mcg daily Tolerating well Needs TSH updated today  Past Medical History:  Diagnosis Date   Anemia    C. difficile diarrhea    2015 -- caught from mother   Carpal tunnel syndrome    Chronic migraine    Congenital cataract    left eye, blind in left eye   Connective tissue disease (HCC)    Diverticulitis    distal colon -- confrimed on CT on 01/16/16 and colonoscopy on 08/29/15   H/O hiatal hernia    no meds - no problems   Headache(784.0)    cannot tolerate many NSAIDs, would do injection of depomedrol in office   HLA B27 (HLA B27 positive)    Hyperlipidemia    Hypertensive retinopathy    Hypothyroidism    well controlled   Kidney stones    Memory loss    associated with peudotumor cerebri   Obesity, morbid (HCC)    Osteoarthritis    Polyarthritis, 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HCC)    Prediabetes    Pseudotumor cerebri syndrome    on lasix   Radiculopathy     Seasonal allergies    Seizures (HCC)     on meds - last one 10/18/11- meds increased -- thinks of soma   Sinusitis    Sleep apnea    Does use CPAP   Spondylarthritis    Transient alteration of awareness    Uterine fibroid    Vitamin D deficiency      Social History   Tobacco Use   Smoking status: Never   Smokeless tobacco: Never  Substance Use Topics   Alcohol use: Yes    Alcohol/week: 1.0 standard drink of alcohol    Types: 1 Glasses of wine per week   Drug use: No    Past Surgical History:  Procedure Laterality Date   BACK SURGERY  09/28/2007   Degenerative Disc Disease - surgery on  L3/4, L4/5   EYE SURGERY  1975   left eye cataract surgery   IUD REMOVAL  01/09/2012   Procedure: INTRAUTERINE DEVICE (IUD) REMOVAL;  Surgeon: Serita Kyle, MD;  Location: [facility];  Service: Gynecology;  Laterality: N/A;   OVARIAN CYST REMOVAL  01/09/2012   Procedure: OVARIAN CYSTECTOMY;  Surgeon: Serita Kyle, MD;  Location: [facility];  Service: Gynecology;  Laterality: Right;   right knee arthroscopic  09/24/2006   TONSILLECTOMY     VESICOVAGINAL FISTULA CLOSURE W/  TAH     for fibroids    Family History  Problem Relation Age of Onset   Hypertension Mother    Heart disease Mother    Diabetes Mother    Lupus Mother    Dementia Mother    CAD Father    Heart disease Father    Hypertension Sister    Breast cancer Sister    Hypertension Brother    Diabetes Brother    Heart disease Maternal Grandmother    CVA Maternal Grandmother    Colon cancer Neg Hx    Asthma Neg Hx     Allergies  Allergen Reactions   Shellfish Allergy Itching   Advil [Ibuprofen]     Rapid heartbeat   Aleve [Naproxen Sodium]     dizziness Nausea -- only causes if this takes regularly   Aspirin Tinitus   Benadryl [Diphenhydramine] Hives and Other (See Comments)    fatigue   Doxycycline Other (See Comments)    Kidney stones   Lactose Intolerance (Gi) Diarrhea   Limbrel [Flavocoxid]      Diarrhea     Mobic [Meloxicam]     vomiting    Neosporin [Neomycin-Bacitracin Zn-Polymyx]     Causes infection   Soma [Carisoprodol] Other (See Comments)    seizures   Codeine Rash    Patient states that Codeine in liquid form causes her rash reaction.  But other forms of codeine are okay for patient to take.   Latex Rash   Morphine And Codeine Itching and Rash   Penicillins Rash   Prednisone Palpitations    Weight gain    Current Medications:   Current Outpatient Medications:    acetaminophen (TYLENOL) 500 MG tablet, Take 500 mg by mouth every 6 (six) hours as needed. Patient used this medication for pain., Disp: , Rfl:    albuterol (VENTOLIN HFA) 108 (90 Base) MCG/ACT inhaler, Inhale 2 puffs into the lungs every 6 (six) hours as needed for wheezing or shortness of breath., Disp: 8 g, Rfl: 0   amLODipine (NORVASC) 10 MG tablet, TAKE 1 TABLET(10 MG) BY MOUTH DAILY, Disp: 90 tablet, Rfl: 1   Biotin 2.5 MG CAPS, Take 1 capsule by mouth daily., Disp: , Rfl:    Calcium 500-2.5 MG-MCG CHEW, Chew 500 mg by mouth daily., Disp: , Rfl:    cetirizine (ZYRTEC) 10 MG tablet, Take 1 tablet (10 mg total) by mouth daily., Disp: 30 tablet, Rfl: 11   Cholecalciferol (VITAMIN D-3) 5000 units TABS, Takes one tablet daily., Disp: 30 tablet, Rfl:    Clobetasol & Clobetasol Emul 0.05 & 0.05 % MISC, Apply 1 application topically 2 (two) times daily as needed. , Disp: , Rfl:    clobetasol cream (TEMOVATE) 0.05 %, Apply 1 Application topically 2 (two) times daily., Disp: 60 g, Rfl: 2   CRANBERRY PO, Take 2 capsules by mouth daily in the afternoon. 4200 mg, Disp: , Rfl:    dextromethorphan (DELSYM) 30 MG/5ML liquid, Take by mouth as needed for cough., Disp: , Rfl:    diclofenac sodium (VOLTAREN) 1 % GEL, Apply 2 g topically 4 (four) times daily., Disp: , Rfl:    hydroxychloroquine (PLAQUENIL) 200 MG tablet, Take 400 mg by mouth daily., Disp: , Rfl:    Multiple Vitamin (MULTIVITAMIN) tablet, Take 2 tablets by  mouth daily., Disp: , Rfl:    Pyridoxine HCl (VITAMIN B6) 200 MG TABS, Take one tablet daily., Disp: 30 tablet, Rfl:    zonisamide (ZONEGRAN) 100 MG capsule, Take 400  mg by mouth daily. , Disp: , Rfl:    carvedilol (COREG) 25 MG tablet, Take 1 tablet (25 mg total) by mouth 2 (two) times daily with a meal., Disp: 180 tablet, Rfl: 1   furosemide (LASIX) 40 MG tablet, Take 1.5 tablets (60 mg total) by mouth daily., Disp: 135 tablet, Rfl: 1   gabapentin (NEURONTIN) 300 MG capsule, TAKE 1 CAPSULE(300 MG) BY MOUTH AT BEDTIME, Disp: 90 capsule, Rfl: 1   levothyroxine (SYNTHROID) 75 MCG tablet, Take 1 tablet (75 mcg total) by mouth daily before breakfast., Disp: 90 tablet, Rfl: 1   losartan (COZAAR) 100 MG tablet, Take 1 tablet (100 mg total) by mouth daily., Disp: 90 tablet, Rfl: 3   potassium chloride (KLOR-CON M) 10 MEQ tablet, Take 1 tablet (10 mEq total) by mouth once for 1 dose., Disp: 180 tablet, Rfl: 1   Review of Systems:   Review of Systems  Skin:  Positive for rash (arm, appeared after opening overly perfumed package).  Negative unless otherwise specified per HPI.  Vitals:   Vitals:   08/04/23 0945  BP: 136/80  Pulse: 91  Temp: 98 F (36.7 C)  TempSrc: Temporal  SpO2: 98%  Weight: 230 lb (104.3 kg)  Height: 5' 3.5" (1.613 m)     Body mass index is 40.1 kg/m.  Physical Exam:   Physical Exam Vitals and nursing note reviewed.  Constitutional:      General: She is not in acute distress.    Appearance: She is well-developed. She is not ill-appearing or toxic-appearing.  Cardiovascular:     Rate and Rhythm: Normal rate and regular rhythm.     Pulses: Normal pulses.     Heart sounds: Normal heart sounds, S1 normal and S2 normal.  Pulmonary:     Effort: Pulmonary effort is normal.     Breath sounds: Normal breath sounds.  Skin:    General: Skin is warm and dry.  Neurological:     Mental Status: She is alert.     GCS: GCS eye subscore is 4. GCS verbal subscore is 5. GCS  motor subscore is 6.  Psychiatric:        Speech: Speech normal.        Behavior: Behavior normal. Behavior is cooperative.     Assessment and Plan:   Essential hypertension Normotensive Continue 100 mg Losartan, 25 mg Carvedilol BID, 10 mg Amlodipine,  and 60 mg Lasix Follow-up in 6 months, sooner if concerns  Hypothyroidism, unspecified type Update TSH and adjust levothyroxine 75 mcg daily as indicated  Pure hypercholesterolemia Update lipid panel -- will defer to cardiology on treatment  Need for immunization against influenza Update flu shot today  Irritant contact dermatitis due to cosmetics Will refill clobetasol for patient for future use   I,Emily Lagle,acting as a scribe for [employer], PA.,have documented all relevant documentation on the behalf of Jarold Motto, PA,as directed by  Jarold Motto, PA while in the presence of Jarold Motto, [place].  I, Jarold Motto, [place], have reviewed all documentation for this visit. The documentation on 08/04/23 for the exam, diagnosis, procedures, and orders are all accurate and complete.  Jarold Motto, PA-C

## 2023-08-05 ENCOUNTER — Other Ambulatory Visit: Payer: Self-pay | Admitting: Physician Assistant

## 2023-08-05 ENCOUNTER — Telehealth: Payer: Self-pay | Admitting: *Deleted

## 2023-08-05 ENCOUNTER — Telehealth: Payer: Self-pay | Admitting: Physician Assistant

## 2023-08-05 MED ORDER — POTASSIUM CHLORIDE CRYS ER 10 MEQ PO TBCR: 10.0000 meq | EXTENDED_RELEASE_TABLET | Freq: Two times a day (BID) | ORAL | 1 refills | Status: DC

## 2023-08-05 NOTE — Telephone Encounter (Signed)
Patient returned call. Requests to be called.

## 2023-08-05 NOTE — Telephone Encounter (Signed)
Tammy Avery, please clarify Potassium Rx. Is patient suppose to be taking once a day or twice a day?

## 2023-08-05 NOTE — Telephone Encounter (Signed)
See result notes. 

## 2023-08-05 NOTE — Telephone Encounter (Signed)
Noted  

## 2023-08-07 ENCOUNTER — Ambulatory Visit: Payer: 59 | Attending: Cardiovascular Disease | Admitting: Student

## 2023-08-07 ENCOUNTER — Encounter: Payer: Self-pay | Admitting: Student

## 2023-08-07 DIAGNOSIS — E785 Hyperlipidemia, unspecified: Secondary | ICD-10-CM

## 2023-08-07 MED ORDER — EZETIMIBE 10 MG PO TABS: 10.0000 mg | ORAL_TABLET | Freq: Every day | ORAL | 3 refills | Status: AC

## 2023-08-07 MED ORDER — PRAVASTATIN SODIUM 40 MG PO TABS: 40.0000 mg | ORAL_TABLET | Freq: Every evening | ORAL | 3 refills | Status: DC

## 2023-08-07 NOTE — Progress Notes (Signed)
Patient ID: Tammy Avery                 DOB: 10-Jun-1961                    MRN: [id number]      HPI: Tammy Avery is a 62 y.o. female patient referred to lipid clinic by Dr.Skains . PMH is significant for hypertension, OSA, obesity BMI 40.10, family hx of premature ASCVD ( mother had MI at age of 54), CAC score of 183 (95th percentile).  Patient presented today for lipid clinic states she was tolerating pravastatin well but it was switched to rosuvastatin for better LDLc lowering response. Patient was unable to tolerate rosuvastatin due to extreme tiredness drowsiness, dizziness, fell sleep, fell sleep while driving and at work. When held rosuvastatin for a month all symptoms went away. She has tried Crestor half dose than prescribed dose she was not able to tolerate due to same symptoms.  We reviewed options for lowering LDL cholesterol, including other statins, ezetimibe, PCSK-9 inhibitors, bempedoic acid and inclisiran.  Discussed mechanisms of action, dosing, side effects and potential decreases in LDL cholesterol.  Also reviewed cost information and potential options for patient assistance.  Current Medications: none  Intolerances: Crestor 20 mg and pravastatin 40 mg  Risk Factors: hypertension, OSA, obesity BMI 40.10, family hx of premature ASCVD ( mother had MI at age of 17), CAC score of 183 (95th percentile). LDL goal: <70 Last lab: TC 245, TG 109, HDL 85.40, LDL 138  Diet: cut down on soft drinks an chips and fast food  Eats lots of vegetables with lean meat mainly chicken  Exercise: will be stating gym once she retire in couple months   Family History:  Maternal GM died from stroke at age 63  Father MI at age 17  Mother mother had MI at age of 33 Social History:  Alcohol: 1 drink every 3-4 months  Smoking: never Recreational drug use: never  Labs: Lipid Panel     Component Value Date/Time   CHOL 245 (H) 08/04/2023 1018   CHOL 161 02/25/2022 1015   TRIG 109.0  08/04/2023 1018   HDL 85.40 08/04/2023 1018   HDL 91 02/25/2022 1015   CHOLHDL 3 08/04/2023 1018   VLDL 21.8 08/04/2023 1018   LDLCALC 138 (H) 08/04/2023 1018   LDLCALC 59 02/25/2022 1015   LDLCALC 84 08/28/2020 0940   LABVLDL 11 02/25/2022 1015    Past Medical History:  Diagnosis Date   Anemia    C. difficile diarrhea    2015 -- caught from mother   Carpal tunnel syndrome    Chronic migraine    Congenital cataract    left eye, blind in left eye   Connective tissue disease (HCC)    Diverticulitis    distal colon -- confrimed on CT on 01/16/16 and colonoscopy on 08/29/15   H/O hiatal hernia    no meds - no problems   Headache(784.0)    cannot tolerate many NSAIDs, would do injection of depomedrol in office   HLA B27 (HLA B27 positive)    Hyperlipidemia    Hypertensive retinopathy    Hypothyroidism    well controlled   Kidney stones    Memory loss    associated with peudotumor cerebri   Obesity, morbid (HCC)    Osteoarthritis    Polyarthritis, 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HCC)    Prediabetes    Pseudotumor cerebri syndrome    on lasix  Radiculopathy    Seasonal allergies    Seizures (HCC)     on meds - last one 10/18/11- meds increased -- thinks of soma   Sinusitis    Sleep apnea    Does use CPAP   Spondylarthritis    Transient alteration of awareness    Uterine fibroid    Vitamin D deficiency     Current Outpatient Medications on File Prior to Visit  Medication Sig Dispense Refill   acetaminophen (TYLENOL) 500 MG tablet Take 500 mg by mouth every 6 (six) hours as needed. Patient used this medication for pain.     albuterol (VENTOLIN HFA) 108 (90 Base) MCG/ACT inhaler Inhale 2 puffs into the lungs every 6 (six) hours as needed for wheezing or shortness of breath. 8 g 0   amLODipine (NORVASC) 10 MG tablet TAKE 1 TABLET(10 MG) BY MOUTH DAILY 90 tablet 1   Biotin 2.5 MG CAPS Take 1 capsule by mouth daily.     Calcium 500-2.5 MG-MCG CHEW Chew 500 mg by mouth daily.      carvedilol (COREG) 25 MG tablet Take 1 tablet (25 mg total) by mouth 2 (two) times daily with a meal. 180 tablet 1   cetirizine (ZYRTEC) 10 MG tablet Take 1 tablet (10 mg total) by mouth daily. 30 tablet 11   Cholecalciferol (VITAMIN D-3) 5000 units TABS Takes one tablet daily. 30 tablet    Clobetasol & Clobetasol Emul 0.05 & 0.05 % MISC Apply 1 application topically 2 (two) times daily as needed.      clobetasol cream (TEMOVATE) 0.05 % Apply 1 Application topically 2 (two) times daily. 60 g 2   CRANBERRY PO Take 2 capsules by mouth daily in the afternoon. 4200 mg     dextromethorphan (DELSYM) 30 MG/5ML liquid Take by mouth as needed for cough.     diclofenac sodium (VOLTAREN) 1 % GEL Apply 2 g topically 4 (four) times daily.     furosemide (LASIX) 40 MG tablet Take 1.5 tablets (60 mg total) by mouth daily. 135 tablet 1   gabapentin (NEURONTIN) 300 MG capsule TAKE 1 CAPSULE(300 MG) BY MOUTH AT BEDTIME 90 capsule 1   hydroxychloroquine (PLAQUENIL) 200 MG tablet Take 400 mg by mouth daily.     levothyroxine (SYNTHROID) 75 MCG tablet Take 1 tablet (75 mcg total) by mouth daily before breakfast. 90 tablet 1   losartan (COZAAR) 100 MG tablet Take 1 tablet (100 mg total) by mouth daily. 90 tablet 3   Multiple Vitamin (MULTIVITAMIN) tablet Take 2 tablets by mouth daily.     potassium chloride (KLOR-CON M) 10 MEQ tablet Take 1 tablet (10 mEq total) by mouth 2 (two) times daily. 180 tablet 1   Pyridoxine HCl (VITAMIN B6) 200 MG TABS Take one tablet daily. 30 tablet    zonisamide (ZONEGRAN) 100 MG capsule Take 400 mg by mouth daily.      No current facility-administered medications on file prior to visit.    Allergies  Allergen Reactions   Shellfish Allergy Itching   Advil [Ibuprofen]     Rapid heartbeat   Aleve [Naproxen Sodium]     dizziness Nausea -- only causes if this takes regularly   Aspirin Tinitus   Benadryl [Diphenhydramine] Hives and Other (See Comments)    fatigue   Doxycycline  Other (See Comments)    Kidney stones   Lactose Intolerance (Gi) Diarrhea   Limbrel [Flavocoxid]     Diarrhea     Mobic [Meloxicam]  vomiting    Neosporin [Neomycin-Bacitracin Zn-Polymyx]     Causes infection   Soma [Carisoprodol] Other (See Comments)    seizures   Codeine Rash    Patient states that Codeine in liquid form causes her rash reaction.  But other forms of codeine are okay for patient to take.   Latex Rash   Morphine And Codeine Itching and Rash   Penicillins Rash   Prednisone Palpitations    Weight gain    Assessment/Plan:  1. Hyperlipidemia -  Problem  Hyperlipidemia   Current Medications: none  Intolerances: Crestor 20 mg and pravastatin 40 mg  Risk Factors: hypertension, OSA, obesity BMI 40.10, family hx of premature ASCVD ( mother had MI at age of 63), CAC score of 183 (95th percentile). LDL goal: <70 Last lab: TC 245, TG 109, HDL 85.40, LDL 138 (07/2023)    Hyperlipidemia Assessment:  LDL goal: < 70 mg/dl last LDLc 914  mg/dl  Unable to tolerate high intensity statin - rosuvastatin 20 mg -extreme tiredness, dizziness  Tolerated Pravastatin 40 mg well in the past  Discussed next potential options ( other statins, ezetimibe PCSK-9 inhibitors, bempedoic acid and inclisiran); cost, dosing efficacy, side effects  Patient is in agreement to try pravastatin again and try Zetia for now, if LDLc not at goal will add PCSK9i ( pt does not like injectable medications will go on it if she needed)   Plan: Start taking pravastatin 40 mg daily and ezetimibe 10 mg daily  Reiterated importance of heart healthy diet and regular physical activity  Lipid lab and LFTs due in 3 months if LDLc stays above goal patient will think to go on injectable lipid lowering therapy     Thank you,  Carmela Hurt, Pharm.D [facility] A Division of [facility] [address] [place], [place] [address]  Phone: [phone]; Fax: [phone]

## 2023-08-07 NOTE — Assessment & Plan Note (Signed)
Assessment:  LDL goal: < 70 mg/dl last LDLc 202  mg/dl  Unable to tolerate high intensity statin - rosuvastatin 20 mg -extreme tiredness, dizziness  Tolerated Pravastatin 40 mg well in the past  Discussed next potential options ( other statins, ezetimibe PCSK-9 inhibitors, bempedoic acid and inclisiran); cost, dosing efficacy, side effects  Patient is in agreement to try pravastatin again and try Zetia for now, if LDLc not at goal will add PCSK9i ( pt does not like injectable medications will go on it if she needed)   Plan: Start taking pravastatin 40 mg daily and ezetimibe 10 mg daily  Reiterated importance of heart healthy diet and regular physical activity  Lipid lab and LFTs due in 3 months if LDLc stays above goal patient will think to go on injectable lipid lowering therapy

## 2023-08-07 NOTE — Patient Instructions (Signed)
Your Results:             Your most recent labs Goal  Total Cholesterol 245 < 200  Triglycerides 109 < 150  HDL (happy/good cholesterol) 85.40 > 40  LDL (lousy/bad cholesterol 138 < 70   Medication changes: Start taking Pravastatin 40 mg daily and ezetimibe 10 mg daily   Lab orders: We want to repeat labs after 3 months.

## 2023-09-03 ENCOUNTER — Other Ambulatory Visit: Payer: Self-pay | Admitting: Physician Assistant

## 2023-09-17 ENCOUNTER — Ambulatory Visit (INDEPENDENT_AMBULATORY_CARE_PROVIDER_SITE_OTHER): Payer: 59 | Admitting: Physician Assistant

## 2023-09-17 ENCOUNTER — Other Ambulatory Visit (INDEPENDENT_AMBULATORY_CARE_PROVIDER_SITE_OTHER): Payer: 59

## 2023-09-17 ENCOUNTER — Encounter: Payer: Self-pay | Admitting: Physician Assistant

## 2023-09-17 VITALS — BP 126/80 | HR 71 | Temp 97.7°F | Ht 63.5 in | Wt 233.4 lb

## 2023-09-17 DIAGNOSIS — R197 Diarrhea, unspecified: Secondary | ICD-10-CM

## 2023-09-17 LAB — COMPREHENSIVE METABOLIC PANEL
ALT: 26 U/L (ref 0–35)
AST: 30 U/L (ref 0–37)
Albumin: 4 g/dL (ref 3.5–5.2)
Alkaline Phosphatase: 88 U/L (ref 39–117)
BUN: 14 mg/dL (ref 6–23)
CO2: 25 meq/L (ref 19–32)
Calcium: 9.3 mg/dL (ref 8.4–10.5)
Chloride: 108 meq/L (ref 96–112)
Creatinine, Ser: 0.89 mg/dL (ref 0.40–1.20)
GFR: 70.15 mL/min (ref >60.00)
Glucose, Bld: 92 mg/dL (ref 70–99)
Potassium: 3.7 meq/L (ref 3.5–5.1)
Sodium: 141 meq/L (ref 135–145)
Total Bilirubin: 0.4 mg/dL (ref 0.2–1.2)
Total Protein: 7.4 g/dL (ref 6.0–8.3)

## 2023-09-17 LAB — CBC WITH DIFFERENTIAL/PLATELET
Basophils Absolute: 0 10*3/uL (ref 0.0–0.1)
Basophils Relative: 0.7 % (ref 0.0–3.0)
Eosinophils Absolute: 0.3 10*3/uL (ref 0.0–0.7)
Eosinophils Relative: 5.6 % — ABNORMAL HIGH (ref 0.0–5.0)
HCT: 41.3 % (ref 36.0–46.0)
Hemoglobin: 13.4 g/dL (ref 12.0–15.0)
Lymphocytes Relative: 25.4 % (ref 12.0–46.0)
Lymphs Abs: 1.1 10*3/uL (ref 0.7–4.0)
MCHC: 32.4 g/dL (ref 30.0–36.0)
MCV: 88.5 fL (ref 78.0–100.0)
Monocytes Absolute: 0.4 10*3/uL (ref 0.1–1.0)
Monocytes Relative: 9.9 % (ref 3.0–12.0)
Neutro Abs: 2.6 10*3/uL (ref 1.4–7.7)
Neutrophils Relative %: 58.4 % (ref 43.0–77.0)
Platelets: 265 10*3/uL (ref 150.0–400.0)
RBC: 4.67 Mil/uL (ref 3.87–5.11)
RDW: 13.4 % (ref 11.5–15.5)
WBC: 4.5 10*3/uL (ref 4.0–10.5)

## 2023-09-17 LAB — POC INFLUENZA A&B (BINAX/QUICKVUE)
Influenza A, POC: NEGATIVE
Influenza B, POC: NEGATIVE

## 2023-09-17 LAB — TSH: TSH: 3.92 u[IU]/mL (ref 0.35–5.50)

## 2023-09-17 NOTE — Progress Notes (Signed)
Tammy Avery is a 62 y.o. female here for a new problem.  History of Present Illness:   Chief Complaint  Patient presents with   Diarrhea    Pt c/o diarrhea off and on x 1 week, now came back on Tuesday, going 4 times a day, stomach cramps, chills, having right mid abdominal pain and radiating to her back. She has been taking Tylenol, muscle relaxer.    Abdominal Pain Symptoms started with diarrhea on 09/10/23 and then she developed chills, abdominal cramping. Resolved temporarily but severe RLQ abdominal pain and diarrhea recurred on 09/16/23. Denies fever, blood in stool, dizziness, lightheadedness, urinary retention.  She also denies any recent antibiotics. She is hydrating well.  History of diverticulosis and diverticulitis and does not believe this is diverticulitis. She ate at a new restaurant on 09/11/23 and symptoms started shortly after this.  Reports negative at-home Covid-19 test. Taking Tylenol, muscle spasm medication with some relief.  Past Medical History:  Diagnosis Date   Anemia    C. difficile diarrhea    2015 -- caught from mother   Carpal tunnel syndrome    Chronic migraine    Congenital cataract    left eye, blind in left eye   Connective tissue disease (HCC)    Diverticulitis    distal colon -- confrimed on CT on 01/16/16 and colonoscopy on 08/29/15   H/O hiatal hernia    no meds - no problems   Headache(784.0)    cannot tolerate many NSAIDs, would do injection of depomedrol in office   HLA B27 (HLA B27 positive)    Hyperlipidemia    Hypertensive retinopathy    Hypothyroidism    well controlled   Kidney stones    Memory loss    associated with peudotumor cerebri   Obesity, morbid (HCC)    Osteoarthritis    Polyarthritis, 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HCC)    Prediabetes    Pseudotumor cerebri syndrome    on lasix   Radiculopathy    Seasonal allergies    Seizures (HCC)     on meds - last one 10/18/11- meds increased -- thinks of soma   Sinusitis    Sleep  apnea    Does use CPAP   Spondylarthritis    Transient alteration of awareness    Uterine fibroid    Vitamin D deficiency      Social History   Tobacco Use   Smoking status: Never   Smokeless tobacco: Never  Substance Use Topics   Alcohol use: Yes    Alcohol/week: 1.0 standard drink of alcohol    Types: 1 Glasses of wine per week   Drug use: No    Past Surgical History:  Procedure Laterality Date   BACK SURGERY  09/28/2007   Degenerative Disc Disease - surgery on  L3/4, L4/5   EYE SURGERY  1975   left eye cataract surgery   IUD REMOVAL  01/09/2012   Procedure: INTRAUTERINE DEVICE (IUD) REMOVAL;  Surgeon: Serita Kyle, MD;  Location: [facility];  Service: Gynecology;  Laterality: N/A;   OVARIAN CYST REMOVAL  01/09/2012   Procedure: OVARIAN CYSTECTOMY;  Surgeon: Serita Kyle, MD;  Location: [facility];  Service: Gynecology;  Laterality: Right;   right knee arthroscopic  09/24/2006   TONSILLECTOMY     VESICOVAGINAL FISTULA CLOSURE W/ TAH     for fibroids    Family History  Problem Relation Age of Onset   Hypertension Mother    Heart disease Mother  Diabetes Mother    Lupus Mother    Dementia Mother    CAD Father    Heart disease Father    Hypertension Sister    Breast cancer Sister    Hypertension Brother    Diabetes Brother    Heart disease Maternal Grandmother    CVA Maternal Grandmother    Colon cancer Neg Hx    Asthma Neg Hx     Allergies  Allergen Reactions   Shellfish Allergy Itching   Advil [Ibuprofen]     Rapid heartbeat   Aleve [Naproxen Sodium]     dizziness Nausea -- only causes if this takes regularly   Aspirin Tinitus   Benadryl [Diphenhydramine] Hives and Other (See Comments)    fatigue   Doxycycline Other (See Comments)    Kidney stones   Lactose Intolerance (Gi) Diarrhea   Limbrel [Flavocoxid]     Diarrhea     Mobic [Meloxicam]     vomiting    Neosporin [Neomycin-Bacitracin Zn-Polymyx]     Causes infection   Soma  [Carisoprodol] Other (See Comments)    seizures   Codeine Rash    Patient states that Codeine in liquid form causes her rash reaction.  But other forms of codeine are okay for patient to take.   Latex Rash   Morphine And Codeine Itching and Rash   Penicillins Rash   Prednisone Palpitations    Weight gain    Current Medications:   Current Outpatient Medications:    acetaminophen (TYLENOL) 500 MG tablet, Take 500 mg by mouth every 6 (six) hours as needed. Patient used this medication for pain., Disp: , Rfl:    albuterol (VENTOLIN HFA) 108 (90 Base) MCG/ACT inhaler, Inhale 2 puffs into the lungs every 6 (six) hours as needed for wheezing or shortness of breath., Disp: 8 g, Rfl: 0   amLODipine (NORVASC) 10 MG tablet, TAKE 1 TABLET(10 MG) BY MOUTH DAILY, Disp: 90 tablet, Rfl: 1   Biotin 2.5 MG CAPS, Take 1 capsule by mouth daily., Disp: , Rfl:    Calcium 500-2.5 MG-MCG CHEW, Chew 500 mg by mouth daily., Disp: , Rfl:    carvedilol (COREG) 25 MG tablet, Take 1 tablet (25 mg total) by mouth 2 (two) times daily with a meal., Disp: 180 tablet, Rfl: 1   cetirizine (ZYRTEC) 10 MG tablet, Take 1 tablet (10 mg total) by mouth daily., Disp: 30 tablet, Rfl: 11   Cholecalciferol (VITAMIN D-3) 5000 units TABS, Takes one tablet daily., Disp: 30 tablet, Rfl:    Clobetasol & Clobetasol Emul 0.05 & 0.05 % MISC, Apply 1 application topically 2 (two) times daily as needed. , Disp: , Rfl:    clobetasol cream (TEMOVATE) 0.05 %, Apply 1 Application topically 2 (two) times daily., Disp: 60 g, Rfl: 2   CRANBERRY PO, Take 2 capsules by mouth daily in the afternoon. 4200 mg, Disp: , Rfl:    dextromethorphan (DELSYM) 30 MG/5ML liquid, Take by mouth as needed for cough., Disp: , Rfl:    diclofenac sodium (VOLTAREN) 1 % GEL, Apply 2 g topically 4 (four) times daily., Disp: , Rfl:    ezetimibe (ZETIA) 10 MG tablet, Take 1 tablet (10 mg total) by mouth daily., Disp: 90 tablet, Rfl: 3   furosemide (LASIX) 40 MG tablet, Take  1.5 tablets (60 mg total) by mouth daily., Disp: 135 tablet, Rfl: 1   gabapentin (NEURONTIN) 300 MG capsule, TAKE 1 CAPSULE(300 MG) BY MOUTH AT BEDTIME, Disp: 90 capsule, Rfl: 1  hydroxychloroquine (PLAQUENIL) 200 MG tablet, Take 400 mg by mouth daily., Disp: , Rfl:    levothyroxine (SYNTHROID) 75 MCG tablet, Take 1 tablet (75 mcg total) by mouth daily before breakfast., Disp: 90 tablet, Rfl: 1   losartan (COZAAR) 100 MG tablet, Take 1 tablet (100 mg total) by mouth daily., Disp: 90 tablet, Rfl: 3   Multiple Vitamin (MULTIVITAMIN) tablet, Take 2 tablets by mouth daily., Disp: , Rfl:    potassium chloride (KLOR-CON M) 10 MEQ tablet, Take 1 tablet (10 mEq total) by mouth 2 (two) times daily., Disp: 180 tablet, Rfl: 1   pravastatin (PRAVACHOL) 40 MG tablet, Take 1 tablet (40 mg total) by mouth every evening., Disp: 90 tablet, Rfl: 3   Pyridoxine HCl (VITAMIN B6) 200 MG TABS, Take one tablet daily., Disp: 30 tablet, Rfl:    zonisamide (ZONEGRAN) 100 MG capsule, Take 400 mg by mouth daily. , Disp: , Rfl:    Review of Systems:   Review of Systems  Constitutional:  Negative for fever and malaise/fatigue.  HENT:  Negative for congestion.   Eyes:  Negative for blurred vision.  Respiratory:  Negative for cough and shortness of breath.   Cardiovascular:  Negative for chest pain, palpitations and leg swelling.  Gastrointestinal:  Positive for abdominal pain and diarrhea. Negative for blood in stool and vomiting.  Musculoskeletal:  Negative for back pain.  Skin:  Negative for rash.  Neurological:  Negative for loss of consciousness and headaches.    Vitals:   Vitals:   09/17/23 1126  BP: 126/80  Pulse: 71  Temp: 97.7 F (36.5 C)  TempSrc: Temporal  SpO2: 96%  Weight: 233 lb 6.1 oz (105.9 kg)  Height: 5' 3.5" (1.613 m)     Body mass index is 40.69 kg/m.  Physical Exam:   Physical Exam Vitals and nursing note reviewed.  Constitutional:      General: She is not in acute distress.     Appearance: She is well-developed. She is not ill-appearing or toxic-appearing.  Cardiovascular:     Rate and Rhythm: Normal rate and regular rhythm.     Pulses: Normal pulses.     Heart sounds: Normal heart sounds, S1 normal and S2 normal.  Pulmonary:     Effort: Pulmonary effort is normal.     Breath sounds: Normal breath sounds.  Abdominal:     General: Abdomen is flat. Bowel sounds are normal.     Palpations: Abdomen is soft.     Tenderness: There is no abdominal tenderness.  Skin:    General: Skin is warm and dry.  Neurological:     Mental Status: She is alert.     GCS: GCS eye subscore is 4. GCS verbal subscore is 5. GCS motor subscore is 6.  Psychiatric:        Speech: Speech normal.        Behavior: Behavior normal. Behavior is cooperative.    Results for orders placed or performed in visit on 09/17/23  POC Influenza A&B(BINAX/QUICKVUE)  Result Value Ref Range   Influenza A, POC Negative Negative   Influenza B, POC Negative Negative    Assessment and Plan:   Diarrhea, unspecified type No red flags on exam Flu test is negative Suspect possible viral gastroenteritis versus diverticulitis versus appendicitis versus other --low suspicion for appendicitis as she is not tender on my exam at all today however did discuss that I cannot rule that out at this time  We discussed a few options including updating  blood work versus obtaining CT scan versus watchful waiting We decided to trial blood work and stool studies for further evaluation She is going to work on bland diet with mostly liquids  She understands that if her symptoms worsen and she is unable to hydrate, or has any worsening pain, that she will need to be seen in the ER.  I,Alexander Ruley,acting as a Neurosurgeon for [employer], PA.,have documented all relevant documentation on the behalf of Jarold Motto, PA,as directed by  Jarold Motto, PA while in the presence of Jarold Motto, [place].   I, Jarold Motto, [place], have reviewed all documentation for this visit. The documentation on 09/17/23 for the exam, diagnosis, procedures, and orders are all accurate and complete.    Jarold Motto, PA-C

## 2023-09-17 NOTE — Patient Instructions (Signed)
It was great to see you!  Push fluids Hydrate  IF ANY WORSENING SYMPTOM(S), PLEASE let me know AND WE WILL ORDER STAT CT SCAN OR GO TO THE ER  An order for labs has been put in for you. To have this done, you can walk in at the [facility] location without a scheduled appointment.  The address is [address] It is across the street from [facility]. [facility] are located in the basement.   Hours of operation are M-F 8:30am to 5:00pm.  Please note that they are closed for lunch between 12:30 and 1:00pm.  Take care,  Jarold Motto PA-C

## 2023-11-05 LAB — COMPREHENSIVE METABOLIC PANEL
ALT: 26 [IU]/L (ref 0–32)
AST: 28 [IU]/L (ref 0–40)
Albumin: 3.9 g/dL (ref 3.9–4.9)
Alkaline Phosphatase: 106 [IU]/L (ref 44–121)
BUN/Creatinine Ratio: 14 (ref 12–28)
BUN: 16 mg/dL (ref 8–27)
Bilirubin Total: 0.2 mg/dL (ref 0.0–1.2)
CO2: 24 mmol/L (ref 20–29)
Calcium: 9.6 mg/dL (ref 8.7–10.3)
Chloride: 107 mmol/L — ABNORMAL HIGH (ref 96–106)
Creatinine, Ser: 1.14 mg/dL — ABNORMAL HIGH (ref 0.57–1.00)
Globulin, Total: 2.4 g/dL (ref 1.5–4.5)
Glucose: 101 mg/dL — ABNORMAL HIGH (ref 70–99)
Potassium: 4.5 mmol/L (ref 3.5–5.2)
Sodium: 144 mmol/L (ref 134–144)
Total Protein: 6.3 g/dL (ref 6.0–8.5)
eGFR: 55 mL/min/{1.73_m2} — ABNORMAL LOW (ref >59)

## 2023-11-05 LAB — LIPID PANEL
Chol/HDL Ratio: 2.1 {ratio} (ref 0.0–4.4)
Cholesterol, Total: 147 mg/dL (ref 100–199)
HDL: 69 mg/dL (ref >39)
LDL Chol Calc (NIH): 62 mg/dL (ref 0–99)
Triglycerides: 85 mg/dL (ref 0–149)
VLDL Cholesterol Cal: 16 mg/dL (ref 5–40)

## 2024-02-02 ENCOUNTER — Ambulatory Visit: Payer: Self-pay | Admitting: Physician Assistant

## 2024-02-19 ENCOUNTER — Ambulatory Visit: Payer: Self-pay | Admitting: Physician Assistant

## 2024-02-26 ENCOUNTER — Other Ambulatory Visit: Payer: Self-pay | Admitting: Physician Assistant

## 2024-03-04 ENCOUNTER — Encounter: Payer: Self-pay | Admitting: Physician Assistant

## 2024-03-04 ENCOUNTER — Ambulatory Visit (INDEPENDENT_AMBULATORY_CARE_PROVIDER_SITE_OTHER): Payer: Self-pay | Admitting: Physician Assistant

## 2024-03-04 VITALS — BP 150/80 | HR 81 | Temp 97.9°F | Ht 63.5 in | Wt 239.0 lb

## 2024-03-04 DIAGNOSIS — E039 Hypothyroidism, unspecified: Secondary | ICD-10-CM

## 2024-03-04 DIAGNOSIS — I10 Essential (primary) hypertension: Secondary | ICD-10-CM | POA: Diagnosis not present

## 2024-03-04 DIAGNOSIS — M545 Low back pain, unspecified: Secondary | ICD-10-CM

## 2024-03-04 DIAGNOSIS — G629 Polyneuropathy, unspecified: Secondary | ICD-10-CM | POA: Diagnosis not present

## 2024-03-04 DIAGNOSIS — M47819 Spondylosis without myelopathy or radiculopathy, site unspecified: Secondary | ICD-10-CM

## 2024-03-04 LAB — COMPREHENSIVE METABOLIC PANEL WITH GFR
ALT: 20 U/L (ref 0–35)
AST: 27 U/L (ref 0–37)
Albumin: 4.3 g/dL (ref 3.5–5.2)
Alkaline Phosphatase: 88 U/L (ref 39–117)
BUN: 16 mg/dL (ref 6–23)
CO2: 29 meq/L (ref 19–32)
Calcium: 10 mg/dL (ref 8.4–10.5)
Chloride: 103 meq/L (ref 96–112)
Creatinine, Ser: 0.97 mg/dL (ref 0.40–1.20)
GFR: 63.06 mL/min (ref >60.00)
Glucose, Bld: 87 mg/dL (ref 70–99)
Potassium: 4 meq/L (ref 3.5–5.1)
Sodium: 141 meq/L (ref 135–145)
Total Bilirubin: 0.4 mg/dL (ref 0.2–1.2)
Total Protein: 7.4 g/dL (ref 6.0–8.3)

## 2024-03-04 LAB — CBC WITH DIFFERENTIAL/PLATELET
Basophils Absolute: 0 10*3/uL (ref 0.0–0.1)
Basophils Relative: 0.9 % (ref 0.0–3.0)
Eosinophils Absolute: 0.2 10*3/uL (ref 0.0–0.7)
Eosinophils Relative: 4.1 % (ref 0.0–5.0)
HCT: 43.9 % (ref 36.0–46.0)
Hemoglobin: 14.4 g/dL (ref 12.0–15.0)
Lymphocytes Relative: 30.3 % (ref 12.0–46.0)
Lymphs Abs: 1.3 10*3/uL (ref 0.7–4.0)
MCHC: 32.8 g/dL (ref 30.0–36.0)
MCV: 89 fl (ref 78.0–100.0)
Monocytes Absolute: 0.4 10*3/uL (ref 0.1–1.0)
Monocytes Relative: 10.3 % (ref 3.0–12.0)
Neutro Abs: 2.3 10*3/uL (ref 1.4–7.7)
Neutrophils Relative %: 54.4 % (ref 43.0–77.0)
Platelets: 244 10*3/uL (ref 150.0–400.0)
RBC: 4.93 Mil/uL (ref 3.87–5.11)
RDW: 14.1 % (ref 11.5–15.5)
WBC: 4.3 10*3/uL (ref 4.0–10.5)

## 2024-03-04 LAB — TSH: TSH: 3.19 u[IU]/mL (ref 0.35–5.50)

## 2024-03-04 MED ORDER — METHYLPREDNISOLONE 4 MG PO TBPK: ORAL_TABLET | ORAL | 0 refills | Status: AC

## 2024-03-04 MED ORDER — CARVEDILOL 25 MG PO TABS: 25.0000 mg | ORAL_TABLET | Freq: Two times a day (BID) | ORAL | 1 refills | Status: DC

## 2024-03-04 MED ORDER — POTASSIUM CHLORIDE CRYS ER 10 MEQ PO TBCR: 10.0000 meq | EXTENDED_RELEASE_TABLET | Freq: Two times a day (BID) | ORAL | 1 refills | Status: DC

## 2024-03-04 MED ORDER — LEVOTHYROXINE SODIUM 75 MCG PO TABS: 75.0000 ug | ORAL_TABLET | Freq: Every day | ORAL | 1 refills | Status: DC

## 2024-03-04 MED ORDER — GABAPENTIN 300 MG PO CAPS: ORAL_CAPSULE | ORAL | 1 refills | Status: DC

## 2024-03-04 MED ORDER — AMLODIPINE BESYLATE 10 MG PO TABS: 10.0000 mg | ORAL_TABLET | Freq: Every day | ORAL | 1 refills | Status: DC

## 2024-03-04 MED ORDER — LOSARTAN POTASSIUM 100 MG PO TABS: 100.0000 mg | ORAL_TABLET | Freq: Every day | ORAL | 3 refills | Status: AC

## 2024-03-04 NOTE — Patient Instructions (Signed)
 It was great to see you!  A referral has been placed for you to see one of our fantastic providers at [facility]. Someone from their office will be in touch soon regarding scheduling your appointment.  Their location:  [facility]  [address] on the 1st floor Phone number [phone] Fax [phone].   This location is across the street from the entrance to [employer] and in the same complex as the [facility]   Take care,  Jarold Motto PA-C

## 2024-03-04 NOTE — Progress Notes (Signed)
 Tammy Avery is a 63 y.o. female here for a follow up of a pre-existing problem.  History of Present Illness:   Chief Complaint  Patient presents with   Hypertension   Back Pain    Pt c/o right sided back pain, constant for few months. Taking Tylenol with no relief.    HPI  Hypertension: Pt is on Amlodipine 10 mg once daily, Carvedilol 25 mg BID, and Losartan 100 mg once daily.  Good compliance and tolerance reported.  She took her antihypertensives this morning.  She monitors her blood pressure at home and reports normal readings.   Hypothyroidism Currently taking 75 mcg levothyroxine Tolerating well   Back pain / Hx of Spondylarthritis: Pt complain of lower back pain radiating laterally to her right side. Recently her pain has worsened; described as "shooting" and "intense" pain.  She also endorses pressure, stating it has made it difficult for her to walk.  She endorses some weakness in her left leg, but does not believes this is attributed to her back pain.  She typically manages her pain with Tylenol, but has also tried muscle relaxer and Aleve sparingly.  Of note, she has a hx of gallstones, surgery was not recommended.  She has also had lumbar spine surgery, per pt L4 and L5.  She has not been working since 4/11 due to the pain, though she notes she was already scheduled to retire at the end of the month.   Past Medical History:  Diagnosis Date   Anemia    C. difficile diarrhea    2015 -- caught from mother   Carpal tunnel syndrome    Chronic migraine    Congenital cataract    left eye, blind in left eye   Connective tissue disease (HCC)    Diverticulitis    distal colon -- confrimed on CT on 01/16/16 and colonoscopy on 08/29/15   H/O hiatal hernia    no meds - no problems   Headache(784.0)    cannot tolerate many NSAIDs, would do injection of depomedrol in office   HLA B27 (HLA B27 positive)    Hyperlipidemia    Hypertensive retinopathy    Hypothyroidism     well controlled   Kidney stones    Memory loss    associated with peudotumor cerebri   Obesity, morbid (HCC)    Osteoarthritis    Polyarthritis, 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HCC)    Prediabetes    Pseudotumor cerebri syndrome    on lasix   Radiculopathy    Seasonal allergies    Seizures (HCC)     on meds - last one 10/18/11- meds increased -- thinks of soma   Sinusitis    Sleep apnea    Does use CPAP   Spondylarthritis    Transient alteration of awareness    Uterine fibroid    Vitamin D deficiency      Social History   Tobacco Use   Smoking status: Never   Smokeless tobacco: Never  Substance Use Topics   Alcohol use: Yes    Alcohol/week: 1.0 standard drink of alcohol    Types: 1 Glasses of wine per week   Drug use: No    Past Surgical History:  Procedure Laterality Date   BACK SURGERY  09/28/2007   Degenerative Disc Disease - surgery on  L3/4, L4/5   EYE SURGERY  1975   left eye cataract surgery   IUD REMOVAL  01/09/2012   Procedure: INTRAUTERINE DEVICE (IUD) REMOVAL;  Surgeon: Nena Jordan A  Lesta Rater, MD;  Location: [facility];  Service: Gynecology;  Laterality: N/A;   OVARIAN CYST REMOVAL  01/09/2012   Procedure: OVARIAN CYSTECTOMY;  Surgeon: Kandra Orn, MD;  Location: [facility];  Service: Gynecology;  Laterality: Right;   right knee arthroscopic  09/24/2006   TONSILLECTOMY     VESICOVAGINAL FISTULA CLOSURE W/ TAH     for fibroids    Family History  Problem Relation Age of Onset   Hypertension Mother    Heart disease Mother    Diabetes Mother    Lupus Mother    Dementia Mother    Rheum arthritis Mother    CAD Father    Heart disease Father    Hypertension Sister    Breast cancer Sister    Hypertension Brother    Diabetes Brother    Heart disease Maternal Grandmother    CVA Maternal Grandmother    Colon cancer Neg Hx    Asthma Neg Hx     Allergies  Allergen Reactions   Shellfish Allergy Itching   Advil [Ibuprofen]     Rapid heartbeat   Aleve [Naproxen  Sodium]     dizziness Nausea -- only causes if this takes regularly   Aspirin Tinitus   Benadryl [Diphenhydramine] Hives and Other (See Comments)    fatigue   Doxycycline Other (See Comments)    Kidney stones   Lactose Intolerance (Gi) Diarrhea   Limbrel [Flavocoxid]     Diarrhea     Mobic [Meloxicam]     vomiting    Neosporin [Neomycin-Bacitracin Zn-Polymyx]     Causes infection   Soma [Carisoprodol] Other (See Comments)    seizures   Codeine Rash    Patient states that Codeine in liquid form causes her rash reaction.  But other forms of codeine are okay for patient to take.   Latex Rash   Morphine And Codeine Itching and Rash   Penicillins Rash   Prednisone Palpitations    Weight gain    Current Medications:   Current Outpatient Medications:    acetaminophen (TYLENOL) 500 MG tablet, Take 500 mg by mouth every 6 (six) hours as needed. Patient used this medication for pain., Disp: , Rfl:    albuterol (VENTOLIN HFA) 108 (90 Base) MCG/ACT inhaler, Inhale 2 puffs into the lungs every 6 (six) hours as needed for wheezing or shortness of breath., Disp: 8 g, Rfl: 0   Biotin 2.5 MG CAPS, Take 1 capsule by mouth daily., Disp: , Rfl:    Calcium 500-2.5 MG-MCG CHEW, Chew 500 mg by mouth daily., Disp: , Rfl:    cetirizine (ZYRTEC) 10 MG tablet, Take 1 tablet (10 mg total) by mouth daily., Disp: 30 tablet, Rfl: 11   Cholecalciferol (VITAMIN D-3) 5000 units TABS, Takes one tablet daily., Disp: 30 tablet, Rfl:    clobetasol cream (TEMOVATE) 0.05 %, Apply 1 Application topically 2 (two) times daily., Disp: 60 g, Rfl: 2   CRANBERRY PO, Take 2 capsules by mouth daily in the afternoon. 4200 mg, Disp: , Rfl:    dextromethorphan (DELSYM) 30 MG/5ML liquid, Take by mouth as needed for cough., Disp: , Rfl:    diclofenac sodium (VOLTAREN) 1 % GEL, Apply 2 g topically 4 (four) times daily., Disp: , Rfl:    ezetimibe (ZETIA) 10 MG tablet, Take 1 tablet (10 mg total) by mouth daily., Disp: 90 tablet,  Rfl: 3   furosemide (LASIX) 40 MG tablet, TAKE 1 AND 1/2 TABLETS(60 MG) BY MOUTH DAILY, Disp: 135 tablet, Rfl:  1   hydroxychloroquine (PLAQUENIL) 200 MG tablet, Take 400 mg by mouth daily., Disp: , Rfl:    methylPREDNISolone (MEDROL DOSEPAK) 4 MG TBPK tablet, Take as directed, Disp: 21 each, Rfl: 0   Multiple Vitamin (MULTIVITAMIN) tablet, Take 2 tablets by mouth daily., Disp: , Rfl:    pravastatin (PRAVACHOL) 40 MG tablet, Take 1 tablet (40 mg total) by mouth every evening., Disp: 90 tablet, Rfl: 3   Pyridoxine HCl (VITAMIN B6) 200 MG TABS, Take one tablet daily., Disp: 30 tablet, Rfl:    zonisamide (ZONEGRAN) 100 MG capsule, Take 400 mg by mouth daily. , Disp: , Rfl:    amLODipine (NORVASC) 10 MG tablet, Take 1 tablet (10 mg total) by mouth daily., Disp: 90 tablet, Rfl: 1   carvedilol (COREG) 25 MG tablet, Take 1 tablet (25 mg total) by mouth 2 (two) times daily with a meal., Disp: 180 tablet, Rfl: 1   gabapentin (NEURONTIN) 300 MG capsule, TAKE 1 CAPSULE(300 MG) BY MOUTH AT BEDTIME, Disp: 90 capsule, Rfl: 1   levothyroxine (SYNTHROID) 75 MCG tablet, Take 1 tablet (75 mcg total) by mouth daily before breakfast., Disp: 90 tablet, Rfl: 1   losartan (COZAAR) 100 MG tablet, Take 1 tablet (100 mg total) by mouth daily., Disp: 90 tablet, Rfl: 3   potassium chloride (KLOR-CON M) 10 MEQ tablet, Take 1 tablet (10 mEq total) by mouth 2 (two) times daily., Disp: 180 tablet, Rfl: 1   Review of Systems:   Negative unless otherwise specified per HPI.  Vitals:   Vitals:   03/04/24 0955 03/04/24 1022  BP: (!) 156/90 (!) 150/80  Pulse: 81   Temp: 97.9 F (36.6 C)   TempSrc: Temporal   SpO2: 95%   Weight: 239 lb (108.4 kg)   Height: 5' 3.5" (1.613 m)      Body mass index is 41.67 kg/m.  Physical Exam:   Physical Exam Vitals and nursing note reviewed.  Constitutional:      General: She is not in acute distress.    Appearance: She is well-developed. She is not ill-appearing or toxic-appearing.   Cardiovascular:     Rate and Rhythm: Normal rate and regular rhythm.     Pulses: Normal pulses.     Heart sounds: Normal heart sounds, S1 normal and S2 normal.  Pulmonary:     Effort: Pulmonary effort is normal.     Breath sounds: Normal breath sounds.  Musculoskeletal:     Comments: No decreased ROM 2/2 pain with flexion/extension, lateral side bends, or rotation. No reproducible tenderness with deep palpation to bilateral paraspinal muscles. No bony tenderness. No evidence of erythema, rash or ecchymosis. N  Skin:    General: Skin is warm and dry.  Neurological:     Mental Status: She is alert.     GCS: GCS eye subscore is 4. GCS verbal subscore is 5. GCS motor subscore is 6.  Psychiatric:        Speech: Speech normal.        Behavior: Behavior normal. Behavior is cooperative.      Assessment and Plan:   1. Hypothyroidism, unspecified type (Primary) Update TSH and adjust levothyroxine 75 mcg daily - CBC with Differential/Platelet - Comprehensive metabolic panel with GFR - TSH  2. Essential hypertension Above goal today No evidence of end-organ damage on my exam Recommend patient monitor home blood pressure at least a few times weekly Continue Amlodipine 10 mg once daily, Carvedilol 25 mg BID, and Losartan 100 mg once daily  If home monitoring shows consistent elevation, or any symptom(s) develop, recommend reach out to us  for further advice on next steps  - CBC with Differential/Platelet - Comprehensive metabolic panel with GFR  3. Neuropathy Overall stable Continue gabapentin 300 mg nightly Follow up in 6 month(s), sooner if concerns  4. Acute right-sided low back pain without sciatica; Spondylarthritis Unclear etiology She does not progressive worsening of generalized osteoarthritis; has known ankylosing spondylitis and is being followed by rheum -- has been on plaquenil for quite some time -- has not been able tolerate other medications Will refer to sports  medicine for further evaluation She sometimes has palpitations with steroids but would like to trial medrol dose pack for her symptom(s) Will also update renal function and urinalysis as well Red flags symptom(s) reviewed/ER precautions - Urinalysis, Routine w reflex microscopic - Ambulatory referral to Sports Medicine    I, Bernita Bristle, acting as a scribe for Alexander Iba, [place]., have documented all relevant documentation on the behalf of Alexander Iba, [place], as directed by  Alexander Iba, PA while in the presence of Alexander Iba, [place].  I, Alexander Iba, [place], have reviewed all documentation for this visit. The documentation on 03/04/24 for the exam, diagnosis, procedures, and orders are all accurate and complete.  Alexander Iba, PA-C

## 2024-03-11 ENCOUNTER — Ambulatory Visit (INDEPENDENT_AMBULATORY_CARE_PROVIDER_SITE_OTHER): Admitting: Family Medicine

## 2024-03-11 ENCOUNTER — Encounter: Payer: Self-pay | Admitting: Family Medicine

## 2024-03-11 ENCOUNTER — Ambulatory Visit (INDEPENDENT_AMBULATORY_CARE_PROVIDER_SITE_OTHER)

## 2024-03-11 VITALS — BP 130/80 | HR 75 | Ht 63.5 in | Wt 245.0 lb

## 2024-03-11 DIAGNOSIS — M545 Low back pain, unspecified: Secondary | ICD-10-CM | POA: Diagnosis not present

## 2024-03-11 DIAGNOSIS — G8929 Other chronic pain: Secondary | ICD-10-CM | POA: Diagnosis not present

## 2024-03-11 DIAGNOSIS — M5441 Lumbago with sciatica, right side: Secondary | ICD-10-CM

## 2024-03-11 DIAGNOSIS — M546 Pain in thoracic spine: Secondary | ICD-10-CM

## 2024-03-11 DIAGNOSIS — M5442 Lumbago with sciatica, left side: Secondary | ICD-10-CM | POA: Diagnosis not present

## 2024-03-11 NOTE — Progress Notes (Signed)
 I, Miquel Amen, CMA acting as a scribe for Garlan Juniper, MD.  Tammy Avery is a 63 y.o. female who presents to [facility] at [facility] today for LBP x a few months. Pt locates pain to right mid/lower back. Hx of spinal fusion surgery, L4-5, L5-6, told was stable still. Has been dx with kidney tumor, shrinking in size, released by Dr. Dulcy Gibney. Seeing Dr. Meredith Stalls for Rheumatology. Tolerating Hydroxychloroquine . Prednisone has causes palpitations. Prescribed Prednisone taper by PCP, which has been very helpful, but still causes palpitation. Notes some pain radiating into the right hip.   Radiating pain: hip LE numbness/tingling:no LE weakness: some Aggravates: constant Treatments tried: Tylenol , naproxen, Mobic, Lyrica, Humira, Enbrel, Prednisone  Pertinent review of systems: No fevers or chills  Relevant historical information: Hypertension.  History of spondylarthritis with positive HLA-B27. History of lumbar fusion.  History of abdominal lymphadenopathy ultimately found not to be cancerous based on recurrent abdominal imaging tests including MRIs and CT scans.  Exam:  BP 130/80   Pulse 75   Ht 5' 3.5" (1.613 m)   Wt 245 lb (111.1 kg)   LMP 12/13/2011   SpO2 99%   BMI 42.72 kg/m  General: Well Developed, well nourished, and in no acute distress.   MSK: L-spine patient is tilted a bit to the right with gait.  She is nontender to palpation at spinal midline and mildly tender palpation right lumbar paraspinal musculature. Decreased lumbar motion. Strength is intact.    Lab and Radiology Results  X-ray images lumbar spine and thoracic spine obtained today personally and independently interpreted.  T-spine: DISH appearance of T-spine with multiple's bone spurs.  No significant thoracic scoliosis.  No acute compression fractures are visible.  L-spine: The level of fusion lumbar spine involving L3-L4-L5.  Fusion appears to be in good shape.  No acute  fractures are visible.  No severe scoliosis is visible.  Await formal radiology review   Assessment and Plan: 63 y.o. female with right flank pain.  Patient has had extensive workup previously including CT scans and MRIs predominantly designed to evaluate abdominal lymphadenopathy and incidental kidney cysts.  She does not have a great explanation for this pain on those imaging test.  Her pain is thought to be predominantly musculoskeletal.  Thoracic radiculopathy around T10 is a possibility and muscle dysfunction of the flank stabilizers is definitely a possibility as well.  Compression of abdominal contents on the right is also a possibility.  Plan for trial of physical therapy.  Recheck back in about 6 weeks.  If not improved consider advanced imaging such as T-spine MRI.   PDMP not reviewed this encounter. Orders Placed This Encounter  Procedures   DG Lumbar Spine 2-3 Views    Standing Status:   Future    Number of Occurrences:   1    Expiration Date:   04/10/2024    Reason for Exam (SYMPTOM  OR DIAGNOSIS REQUIRED):   low back pain    Preferred imaging location?:   [facility] Thoracic Spine W/Swimmers    Standing Status:   Future    Number of Occurrences:   1    Expiration Date:   03/11/2025    Reason for Exam (SYMPTOM  OR DIAGNOSIS REQUIRED):   thoracic back pain    Preferred imaging location?:   [facility]   Ambulatory referral to Physical Therapy    Referral Priority:   Routine    Referral Type:  Physical Medicine    Referral Reason:   Specialty Services Required    Requested Specialty:   Physical Therapy    Number of Visits Requested:   1   No orders of the defined types were placed in this encounter.    Discussed warning signs or symptoms. Please see discharge instructions. Patient expresses understanding.   The above documentation has been reviewed and is accurate and complete Garlan Juniper, [name].

## 2024-03-11 NOTE — Patient Instructions (Addendum)
 Thank you for coming in today.   Please get an Xray today before you leave   I've referred you to Physical Therapy.  Let us know if you don't hear from them in one week.   Check back in 6 weeks

## 2024-03-18 NOTE — Progress Notes (Signed)
 Low back x-ray shows fusion of L3-L4 L4-L5.  The fusion still looks good.  You do have some arthritis and degenerative changes above the level of the fusion in your lumbar spine.

## 2024-03-18 NOTE — Progress Notes (Signed)
 X-ray images thoracic spine shows a little bit of arthritis.

## 2024-03-22 ENCOUNTER — Ambulatory Visit: Admitting: Physical Therapy

## 2024-03-25 ENCOUNTER — Ambulatory Visit: Admitting: Physical Therapy

## 2024-03-25 ENCOUNTER — Other Ambulatory Visit: Payer: Self-pay

## 2024-03-25 ENCOUNTER — Encounter: Payer: Self-pay | Admitting: Physical Therapy

## 2024-03-25 DIAGNOSIS — M6281 Muscle weakness (generalized): Secondary | ICD-10-CM

## 2024-03-25 DIAGNOSIS — M5459 Other low back pain: Secondary | ICD-10-CM
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
## 2024-03-25 NOTE — Patient Instructions (Signed)
 Access Code: [email or web address] URL: [email or web address] Date: 03/25/2024 Prepared by: Leah Primus  Exercises - Clamshell  - 1 x daily - 2 sets - 10 reps - Supine Lower Trunk Rotation  - 1 x daily - 10 reps - Straight Leg Raise  - 1 x daily - 2 sets - 10 reps - Sit to Stand  - 1 x daily - 3 sets - 5 reps

## 2024-04-14 ENCOUNTER — Encounter: Admitting: Physical Therapy

## 2024-04-21 ENCOUNTER — Encounter: Admitting: Physical Therapy

## 2024-04-22 ENCOUNTER — Ambulatory Visit: Admitting: Family Medicine

## 2024-04-28 ENCOUNTER — Other Ambulatory Visit: Payer: Self-pay

## 2024-04-28 ENCOUNTER — Ambulatory Visit (INDEPENDENT_AMBULATORY_CARE_PROVIDER_SITE_OTHER): Admitting: Physical Therapy

## 2024-04-28 ENCOUNTER — Encounter: Payer: Self-pay | Admitting: Physical Therapy

## 2024-04-28 DIAGNOSIS — M5459 Other low back pain: Secondary | ICD-10-CM | POA: Diagnosis not present

## 2024-04-28 DIAGNOSIS — M6281 Muscle weakness (generalized): Secondary | ICD-10-CM

## 2024-04-28 NOTE — Patient Instructions (Signed)
 Access Code: [email or web address] URL: [email or web address] Date: 04/28/2024 Prepared by: Leah Primus  Exercises - Clamshell  - 1 x daily - 2 sets - 10 reps - Supine Lower Trunk Rotation  - 1 x daily - 10 reps - Straight Leg Raise  - 1 x daily - 2 sets - 10 reps - Sit to Stand  - 1 x daily - 3 sets - 10 reps - Standing Hip Abduction with Counter Support  - 1 x daily - 2 sets - 10 reps - Standing Hip Extension with Counter Support  - 1 x daily - 2 sets - 10 reps
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
## 2024-04-29 ENCOUNTER — Ambulatory Visit: Admitting: Family Medicine

## 2024-04-29 VITALS — BP 130/70 | HR 95 | Ht 63.5 in | Wt 249.2 lb

## 2024-04-29 DIAGNOSIS — G8929 Other chronic pain: Secondary | ICD-10-CM | POA: Diagnosis not present

## 2024-04-29 DIAGNOSIS — M546 Pain in thoracic spine: Secondary | ICD-10-CM | POA: Diagnosis not present

## 2024-04-29 NOTE — Progress Notes (Signed)
   I, Leone Ralphs am a scribe for Dr. Garlan Juniper, MD.  Tammy Avery is a 63 y.o. female who presents to [facility] at [facility] today for f/u thoracic and low back pain. Pt was last seen by Dr. Alease Hunter on 03/11/24 and was referred to PT, completing 2 visits.  Today, pt reports was hurting yesterday because of PT but today she feels good.   Dx imaging: L-spine & t-spine XR  Pertinent review of systems: No fevers or chills  Relevant historical information: Hypertension.  Spondyloarthritis HLA-B27 positive.   Exam:  BP 130/70   Pulse 95   Ht 5' 3.5 (1.613 m)   Wt 249 lb 3.2 oz (113 kg)   LMP 12/13/2011   SpO2 96%   BMI 43.45 kg/m  General: Well Developed, well nourished, and in no acute distress.   MSK: T-spine nontender to palpation midline decreased thoracic motion.   Assessment and Plan: 63 y.o. female with thoracic back pain and radicular pain significantly improving with physical therapy.  Plan to finish out PT and continue home exercise program.  Check back as needed.  Next step for me would be MRI T-spine if needed. Could reorder PT in the future if needed.  PDMP not reviewed this encounter. No orders of the defined types were placed in this encounter.  No orders of the defined types were placed in this encounter.    Discussed warning signs or symptoms. Please see discharge instructions. Patient expresses understanding.   The above documentation has been reviewed and is accurate and complete Garlan Juniper, [name].

## 2024-05-05 ENCOUNTER — Encounter: Admitting: Physical Therapy

## 2024-05-20 ENCOUNTER — Other Ambulatory Visit: Payer: Self-pay

## 2024-05-20 ENCOUNTER — Ambulatory Visit (INDEPENDENT_AMBULATORY_CARE_PROVIDER_SITE_OTHER): Admitting: Family Medicine

## 2024-05-20 ENCOUNTER — Ambulatory Visit

## 2024-05-20 ENCOUNTER — Encounter: Admitting: Physical Therapy

## 2024-05-20 VITALS — BP 130/70 | Ht 63.5 in | Wt 249.0 lb

## 2024-05-20 DIAGNOSIS — M25562 Pain in left knee: Secondary | ICD-10-CM | POA: Diagnosis not present

## 2024-05-20 DIAGNOSIS — G8929 Other chronic pain: Secondary | ICD-10-CM

## 2024-05-20 DIAGNOSIS — M7122 Synovial cyst of popliteal space [Baker], left knee: Secondary | ICD-10-CM

## 2024-05-20 NOTE — Progress Notes (Signed)
 LILLETTE Ileana Collet, PhD, LAT, ATC acting as a scribe for Artist Lloyd, MD.  Tammy Avery is a 63 y.o. female who presents to [facility] at [facility] today for L knee pain. Pt was previously seen by Dr. Lloyd on 04/29/24 for thoracic back pain.  Today, pt c/o L knee pain x 6/21. She stood up and felt a pop in her L knee. Pt locates pain to the anterior-lateral aspect of her L knee. She notes a prior dx of runner's day in the '90's  L Knee swelling: yes Mechanical symptoms: no Aggravates: walking Treatments tried: ice, heat, elevation, Tylenol   Pertinent review of systems: No fevers or chills  Relevant historical information: Psoriatic arthritis   Exam:  BP 130/70   Ht 5' 3.5 (1.613 m)   Wt 249 lb (112.9 kg)   LMP 12/13/2011   BMI 43.42 kg/m  General: Well Developed, well nourished, and in no acute distress.   MSK: Left knee mild effusion normal-appearing otherwise. Range of motion limited flexion. Intact strength. Ligamentous exam nondiagnostic due to guarding. McMurray's test nondiagnostic due to guarding. Antalgic gait   Lab and Radiology Results  Procedure: Real-time Ultrasound Guided aspiration and injection of Baker's cyst left knee Device: Philips Affiniti 50G/GE Logiq Images permanently stored and available for review in PACS Ultrasound evaluation prior to injection reveals a moderate Baker's cyst and a mild joint effusion.  Intact quad and patellar tendons. Verbal informed consent obtained.  Discussed risks and benefits of procedure. Warned about infection, bleeding, hyperglycemia damage to structures among others. Patient expresses understanding and agreement Time-out conducted.   Noted no overlying erythema, induration, or other signs of local infection.   Skin prepped in a sterile fashion.   Local anesthesia: Topical Ethyl chloride.   With sterile technique and under real time ultrasound guidance: 2 mL of lidocaine  injected into  subcutaneous tissue overlying the Baker's cyst at the posterior medial knee.. Skin was again sterilized with isopropyl alcohol. 18-gauge needle was used to access the Baker's cyst. 10 mL of clear straw-colored fluid was aspirated decompressing the Baker's cyst on ultrasound. Syringe was exchanged and 40 mg of Kenalog  and 2 mg of Marcaine  were injected into the decompressed Baker's cyst. Completed without difficulty   Pain moderately resolved suggesting accurate placement of the medication.   Advised to call if fevers/chills, erythema, induration, drainage, or persistent bleeding.   Images permanently stored and available for review in the ultrasound unit.  Impression: Technically successful ultrasound guided injection.   X-ray images left knee obtained today personally and independently interpreted. Severe medial and lateral DJD.  No acute fractures are visible. Await formal radiology review     Assessment and Plan: 63 y.o. female with acute exacerbation of chronic left knee pain.  Patient does have a Baker's cyst and significant degenerative changes seen on x-ray.  Baker's cyst was aspirated and injected today.  If this does not work well enough we can proceed with a more conventional interarticular knee injection.  Recommend that patient schedule an appoint with me for 1 week so that we can proceed with injection if needed.  PDMP not reviewed this encounter. Orders Placed This Encounter  Procedures   DG Knee AP/LAT W/Sunrise Left    Standing Status:   Future    Number of Occurrences:   1    Expiration Date:   06/20/2024    Reason for Exam (SYMPTOM  OR DIAGNOSIS REQUIRED):   left knee pain    Preferred  imaging location?:   [facility] [place]   US  LIMITED JOINT SPACE STRUCTURES LOW LEFT(NO LINKED CHARGES)    Reason for Exam (SYMPTOM  OR DIAGNOSIS REQUIRED):   left knee pain    Preferred imaging location?:   [facility]   No orders of the defined types were  placed in this encounter.    Discussed warning signs or symptoms. Please see discharge instructions. Patient expresses understanding.   The above documentation has been reviewed and is accurate and complete Artist Lloyd, [name].

## 2024-05-20 NOTE — Patient Instructions (Addendum)
 Thank you for coming in today.   You received an injection today. Seek immediate medical attention if the joint becomes red, extremely painful, or is oozing fluid.   Please get an Xray today before you leave   Check back in 1 week

## 2024-05-24 ENCOUNTER — Ambulatory Visit: Payer: Self-pay | Admitting: Family Medicine

## 2024-05-24 NOTE — Progress Notes (Signed)
 Left knee x-ray shows severe arthritis.

## 2024-05-27 ENCOUNTER — Encounter: Admitting: Physical Therapy

## 2024-05-31 ENCOUNTER — Other Ambulatory Visit: Payer: Self-pay

## 2024-05-31 ENCOUNTER — Ambulatory Visit: Admitting: Family Medicine

## 2024-05-31 VITALS — BP 130/80 | HR 95 | Ht 63.5 in | Wt 247.0 lb

## 2024-05-31 DIAGNOSIS — M25562 Pain in left knee: Secondary | ICD-10-CM

## 2024-05-31 DIAGNOSIS — G8929 Other chronic pain: Secondary | ICD-10-CM

## 2024-05-31 NOTE — Progress Notes (Signed)
   LILLETTE Ileana Collet, PhD, LAT, ATC acting as a scribe for Artist Lloyd, MD.  Tammy Avery is a 63 y.o. female who presents to [facility] at [facility] today for f/u L knee pain. Pt was last seen by Dr. Lloyd on 05/20/24 and her Baker's cyst was aspirated and injected.   Today, pt reports L knee pain is better than it was, but is still somewhat painful. Pain is intermittent and varies. She is wondering what her prognosis is and future treatment plans.   Dx imaging: 05/20/24 L knee XR  Pertinent review of systems: No fevers or chills  Relevant historical information: Very left knee arthritis   Exam:  BP 130/80   Pulse 95   Ht 5' 3.5 (1.613 m)   Wt 247 lb (112 kg)   LMP 12/13/2011   SpO2 96%   BMI 43.07 kg/m  General: Well Developed, well nourished, and in no acute distress.   MSK: Procedure: Real-time Ultrasound Guided Injection of left knee joint superior lateral patella space Device: Philips Affiniti 50G/GE Logiq Images permanently stored and available for review in PACS Verbal informed consent obtained.  Discussed risks and benefits of procedure. Warned about infection, bleeding, hyperglycemia damage to structures among others. Patient expresses understanding and agreement Time-out conducted.   Noted no overlying erythema, induration, or other signs of local infection.   Skin prepped in a sterile fashion.   Local anesthesia: Topical Ethyl chloride.   With sterile technique and under real time ultrasound guidance: 40 mg of Kenalog  and 2 mL of Marcaine  injected into knee joint. Fluid seen entering the joint capsule.   Completed without difficulty   Pain immediately resolved suggesting accurate placement of the medication.   Advised to call if fevers/chills, erythema, induration, drainage, or persistent bleeding.   Images permanently stored and available for review in the ultrasound unit.  Impression: Technically successful ultrasound guided  injection.       Lab and Radiology Results No results found for this or any previous visit (from the past 72 hours). No results found.     Assessment and Plan: 63 y.o. female with chronic left knee pain due to DJD.  Plan for conventional interarticular injection.  She had good relief almost 2 weeks ago with aspiration and injection of the Baker's cyst.  Consider hyaluronic acid injection or Zilretta  injection in the future if needed.  Consider knee replacement or genicular artery embolization in the future if needed.   PDMP not reviewed this encounter. Orders Placed This Encounter  Procedures   US  LIMITED JOINT SPACE STRUCTURES LOW LEFT(NO LINKED CHARGES)    Reason for Exam (SYMPTOM  OR DIAGNOSIS REQUIRED):   left knee pain    Preferred imaging location?:    [facility]   No orders of the defined types were placed in this encounter.    Discussed warning signs or symptoms. Please see discharge instructions. Patient expresses understanding.   The above documentation has been reviewed and is accurate and complete Artist Lloyd, [name].

## 2024-05-31 NOTE — Patient Instructions (Addendum)
 Thank you for coming in today.   You received an injection today. Seek immediate medical attention if the joint becomes red, extremely painful, or is oozing fluid.   Reminder: Dr. Joane will be out of the office starting August 1st, for about 6 weeks

## 2024-06-21 ENCOUNTER — Encounter: Admitting: Physical Therapy

## 2024-06-28 ENCOUNTER — Encounter: Admitting: Physical Therapy

## 2024-07-05 ENCOUNTER — Encounter: Admitting: Physical Therapy

## 2024-07-12 ENCOUNTER — Encounter: Admitting: Physical Therapy

## 2024-07-13 ENCOUNTER — Other Ambulatory Visit: Payer: Self-pay | Admitting: Physician Assistant

## 2024-07-13 DIAGNOSIS — Z1231 Encounter for screening mammogram for malignant neoplasm of breast: Secondary | ICD-10-CM

## 2024-08-02 ENCOUNTER — Encounter: Admitting: Physical Therapy

## 2024-08-03 ENCOUNTER — Ambulatory Visit

## 2024-08-09 ENCOUNTER — Ambulatory Visit
Admission: RE | Admit: 2024-08-09 | Discharge: 2024-08-09 | Disposition: A | Discharge status: Home / Self Care | Source: Ambulatory Visit | Attending: Physician Assistant | Admitting: Physician Assistant

## 2024-08-09 DIAGNOSIS — Z1231 Encounter for screening mammogram for malignant neoplasm of breast: Secondary | ICD-10-CM

## 2024-09-07 ENCOUNTER — Other Ambulatory Visit: Payer: Self-pay | Admitting: Physician Assistant

## 2024-09-29 ENCOUNTER — Other Ambulatory Visit: Payer: Self-pay | Admitting: Physician Assistant

## 2024-10-28 ENCOUNTER — Other Ambulatory Visit: Payer: Self-pay | Admitting: Physician Assistant

## 2024-11-10 ENCOUNTER — Other Ambulatory Visit: Payer: Self-pay | Admitting: Cardiology

## 2024-11-10 ENCOUNTER — Other Ambulatory Visit: Payer: Self-pay | Admitting: Physician Assistant

## 2024-11-16 ENCOUNTER — Other Ambulatory Visit: Payer: Self-pay | Admitting: Physician Assistant
